# Patient Record
Sex: Female | Born: 1940 | Hispanic: Yes | Smoking: Never smoker
Health system: Southern US, Community
[De-identification: ages and names within clinical notes are randomized; demographics above are authoritative.]

## PROBLEM LIST (undated history)

## (undated) DIAGNOSIS — R001 Bradycardia, unspecified: Secondary | ICD-10-CM

## (undated) DIAGNOSIS — I272 Pulmonary hypertension, unspecified: Secondary | ICD-10-CM

## (undated) DIAGNOSIS — G8929 Other chronic pain: Secondary | ICD-10-CM

## (undated) DIAGNOSIS — G43909 Migraine, unspecified, not intractable, without status migrainosus: Secondary | ICD-10-CM

## (undated) DIAGNOSIS — Z8639 Personal history of other endocrine, nutritional and metabolic disease: Secondary | ICD-10-CM

## (undated) DIAGNOSIS — Z9989 Dependence on other enabling machines and devices: Secondary | ICD-10-CM

## (undated) DIAGNOSIS — Z950 Presence of cardiac pacemaker: Secondary | ICD-10-CM

## (undated) DIAGNOSIS — K219 Gastro-esophageal reflux disease without esophagitis: Secondary | ICD-10-CM

## (undated) DIAGNOSIS — G4733 Obstructive sleep apnea (adult) (pediatric): Secondary | ICD-10-CM

## (undated) DIAGNOSIS — I509 Heart failure, unspecified: Secondary | ICD-10-CM

## (undated) DIAGNOSIS — K5090 Crohn's disease, unspecified, without complications: Secondary | ICD-10-CM

## (undated) DIAGNOSIS — R011 Cardiac murmur, unspecified: Secondary | ICD-10-CM

## (undated) DIAGNOSIS — E7800 Pure hypercholesterolemia, unspecified: Secondary | ICD-10-CM

## (undated) DIAGNOSIS — M549 Dorsalgia, unspecified: Secondary | ICD-10-CM

## (undated) DIAGNOSIS — C4490 Unspecified malignant neoplasm of skin, unspecified: Secondary | ICD-10-CM

## (undated) DIAGNOSIS — Z9289 Personal history of other medical treatment: Secondary | ICD-10-CM

## (undated) DIAGNOSIS — I10 Essential (primary) hypertension: Secondary | ICD-10-CM

## (undated) HISTORY — PX: CARDIAC CATHETERIZATION: SHX172

## (undated) HISTORY — DX: Personal history of other endocrine, nutritional and metabolic disease: Z86.39

## (undated) HISTORY — DX: Personal history of other medical treatment: Z92.89

---

## 1970-07-20 HISTORY — PX: VAGINAL HYSTERECTOMY: SUR661

## 1980-07-20 HISTORY — PX: CATARACT EXTRACTION: SUR2

## 2001-07-20 DIAGNOSIS — C4490 Unspecified malignant neoplasm of skin, unspecified: Secondary | ICD-10-CM

## 2001-07-20 HISTORY — DX: Unspecified malignant neoplasm of skin, unspecified: C44.90

## 2001-07-20 HISTORY — PX: SKIN CANCER EXCISION: SHX779

## 2014-01-29 ENCOUNTER — Emergency Department (HOSPITAL_COMMUNITY): Payer: Medicaid Other

## 2014-01-29 ENCOUNTER — Inpatient Hospital Stay (HOSPITAL_COMMUNITY)
Admission: EM | Admit: 2014-01-29 | Discharge: 2014-02-01 | DRG: 918 | Disposition: A | Discharge status: Home / Self Care | Payer: Medicaid Other | Attending: Internal Medicine | Admitting: Internal Medicine

## 2014-01-29 ENCOUNTER — Encounter (HOSPITAL_COMMUNITY): Payer: Self-pay | Admitting: Emergency Medicine

## 2014-01-29 DIAGNOSIS — E875 Hyperkalemia: Secondary | ICD-10-CM | POA: Diagnosis present

## 2014-01-29 DIAGNOSIS — E1159 Type 2 diabetes mellitus with other circulatory complications: Secondary | ICD-10-CM | POA: Diagnosis present

## 2014-01-29 DIAGNOSIS — K5090 Crohn's disease, unspecified, without complications: Secondary | ICD-10-CM | POA: Diagnosis present

## 2014-01-29 DIAGNOSIS — T46901A Poisoning by unspecified agents primarily affecting the cardiovascular system, accidental (unintentional), initial encounter: Secondary | ICD-10-CM | POA: Diagnosis present

## 2014-01-29 DIAGNOSIS — R001 Bradycardia, unspecified: Secondary | ICD-10-CM | POA: Diagnosis present

## 2014-01-29 DIAGNOSIS — I5030 Unspecified diastolic (congestive) heart failure: Secondary | ICD-10-CM | POA: Diagnosis present

## 2014-01-29 DIAGNOSIS — I5032 Chronic diastolic (congestive) heart failure: Secondary | ICD-10-CM | POA: Diagnosis present

## 2014-01-29 DIAGNOSIS — T460X4S Poisoning by cardiac-stimulant glycosides and drugs of similar action, undetermined, sequela: Secondary | ICD-10-CM

## 2014-01-29 DIAGNOSIS — I272 Pulmonary hypertension, unspecified: Secondary | ICD-10-CM | POA: Diagnosis present

## 2014-01-29 DIAGNOSIS — I498 Other specified cardiac arrhythmias: Secondary | ICD-10-CM | POA: Diagnosis present

## 2014-01-29 DIAGNOSIS — E119 Type 2 diabetes mellitus without complications: Secondary | ICD-10-CM

## 2014-01-29 DIAGNOSIS — I2789 Other specified pulmonary heart diseases: Secondary | ICD-10-CM | POA: Diagnosis present

## 2014-01-29 DIAGNOSIS — I5031 Acute diastolic (congestive) heart failure: Secondary | ICD-10-CM

## 2014-01-29 DIAGNOSIS — Z79899 Other long term (current) drug therapy: Secondary | ICD-10-CM | POA: Diagnosis not present

## 2014-01-29 DIAGNOSIS — T460X1A Poisoning by cardiac-stimulant glycosides and drugs of similar action, accidental (unintentional), initial encounter: Secondary | ICD-10-CM

## 2014-01-29 DIAGNOSIS — I10 Essential (primary) hypertension: Secondary | ICD-10-CM

## 2014-01-29 DIAGNOSIS — E1122 Type 2 diabetes mellitus with diabetic chronic kidney disease: Secondary | ICD-10-CM

## 2014-01-29 DIAGNOSIS — G4733 Obstructive sleep apnea (adult) (pediatric): Secondary | ICD-10-CM | POA: Diagnosis present

## 2014-01-29 DIAGNOSIS — T460X1S Poisoning by cardiac-stimulant glycosides and drugs of similar action, accidental (unintentional), sequela: Secondary | ICD-10-CM

## 2014-01-29 DIAGNOSIS — I359 Nonrheumatic aortic valve disorder, unspecified: Secondary | ICD-10-CM | POA: Diagnosis present

## 2014-01-29 DIAGNOSIS — I509 Heart failure, unspecified: Secondary | ICD-10-CM | POA: Diagnosis present

## 2014-01-29 DIAGNOSIS — I2510 Atherosclerotic heart disease of native coronary artery without angina pectoris: Secondary | ICD-10-CM | POA: Diagnosis present

## 2014-01-29 DIAGNOSIS — N179 Acute kidney failure, unspecified: Secondary | ICD-10-CM | POA: Diagnosis present

## 2014-01-29 DIAGNOSIS — I152 Hypertension secondary to endocrine disorders: Secondary | ICD-10-CM | POA: Diagnosis present

## 2014-01-29 DIAGNOSIS — Z9989 Dependence on other enabling machines and devices: Secondary | ICD-10-CM

## 2014-01-29 HISTORY — DX: Cardiac murmur, unspecified: R01.1

## 2014-01-29 HISTORY — DX: Pulmonary hypertension, unspecified: I27.20

## 2014-01-29 HISTORY — DX: Crohn's disease, unspecified, without complications: K50.90

## 2014-01-29 HISTORY — DX: Dependence on other enabling machines and devices: Z99.89

## 2014-01-29 HISTORY — DX: Heart failure, unspecified: I50.9

## 2014-01-29 HISTORY — DX: Obstructive sleep apnea (adult) (pediatric): G47.33

## 2014-01-29 HISTORY — DX: Essential (primary) hypertension: I10

## 2014-01-29 HISTORY — DX: Bradycardia, unspecified: R00.1

## 2014-01-29 LAB — CBC
HCT: 45 % (ref 36.0–46.0)
Hemoglobin: 14.6 g/dL (ref 12.0–15.0)
MCH: 29.7 pg (ref 26.0–34.0)
MCHC: 32.4 g/dL (ref 30.0–36.0)
MCV: 91.6 fL (ref 78.0–100.0)
Platelets: 262 10*3/uL (ref 150–400)
RBC: 4.91 MIL/uL (ref 3.87–5.11)
RDW: 14.6 % (ref 11.5–15.5)
WBC: 10 10*3/uL (ref 4.0–10.5)

## 2014-01-29 LAB — COMPREHENSIVE METABOLIC PANEL
ALT: 60 U/L — ABNORMAL HIGH (ref 0–35)
AST: 31 U/L (ref 0–37)
Albumin: 4.2 g/dL (ref 3.5–5.2)
Alkaline Phosphatase: 167 U/L — ABNORMAL HIGH (ref 39–117)
Anion gap: 16 — ABNORMAL HIGH (ref 5–15)
BUN: 31 mg/dL — ABNORMAL HIGH (ref 6–23)
CO2: 22 mEq/L (ref 19–32)
Calcium: 9.9 mg/dL (ref 8.4–10.5)
Chloride: 102 mEq/L (ref 96–112)
Creatinine, Ser: 1.79 mg/dL — ABNORMAL HIGH (ref 0.50–1.10)
GFR calc Af Amer: 31 mL/min — ABNORMAL LOW (ref >90)
GFR calc non Af Amer: 27 mL/min — ABNORMAL LOW (ref >90)
Glucose, Bld: 127 mg/dL — ABNORMAL HIGH (ref 70–99)
Potassium: 5.4 mEq/L — ABNORMAL HIGH (ref 3.7–5.3)
Sodium: 140 mEq/L (ref 137–147)
Total Bilirubin: 0.4 mg/dL (ref 0.3–1.2)
Total Protein: 8.8 g/dL — ABNORMAL HIGH (ref 6.0–8.3)

## 2014-01-29 LAB — URINALYSIS, ROUTINE W REFLEX MICROSCOPIC
Bilirubin Urine: NEGATIVE
Glucose, UA: NEGATIVE mg/dL
Hgb urine dipstick: NEGATIVE
Ketones, ur: NEGATIVE mg/dL
Leukocytes, UA: NEGATIVE
Nitrite: NEGATIVE
Protein, ur: NEGATIVE mg/dL
Specific Gravity, Urine: 1.008 (ref 1.005–1.030)
Urobilinogen, UA: 1 mg/dL (ref 0.0–1.0)
pH: 6 (ref 5.0–8.0)

## 2014-01-29 LAB — DIGOXIN LEVEL: Digoxin Level: 1.9 ng/mL (ref 0.8–2.0)

## 2014-01-29 LAB — PRO B NATRIURETIC PEPTIDE: Pro B Natriuretic peptide (BNP): 1482 pg/mL — ABNORMAL HIGH (ref 0–125)

## 2014-01-29 LAB — I-STAT TROPONIN, ED: Troponin i, poc: 0.02 ng/mL (ref 0.00–0.08)

## 2014-01-29 MED ORDER — ASPIRIN 81 MG PO CHEW
324.0000 mg | CHEWABLE_TABLET | Freq: Once | ORAL | Status: AC
  Administered 2014-01-29: 324 mg via ORAL
  Filled 2014-01-29: qty 4

## 2014-01-29 MED ORDER — FUROSEMIDE 20 MG PO TABS: 20.0000 mg | ORAL_TABLET | Freq: Every day | ORAL | Status: DC

## 2014-01-29 MED ORDER — SODIUM CHLORIDE 0.9 % IV SOLN: 1000.0000 mL | INTRAVENOUS | Status: DC

## 2014-01-29 MED ORDER — SODIUM CHLORIDE 0.9 % IV SOLN
1000.0000 mL | Freq: Once | INTRAVENOUS | Status: AC
  Administered 2014-01-29: 1000 mL via INTRAVENOUS

## 2014-01-29 NOTE — ED Notes (Signed)
CBG 124 

## 2014-01-29 NOTE — H&P (Signed)
PCP:  none   Chief Complaint:  Low heart rate  HPI: Heather [name] is a 73 y.o. female   has a past medical history of CHF (congestive heart failure); Hypertension; Diabetes mellitus without complication; Murmur, cardiac; Pulmonary hypertension; OSA on CPAP; and Crohn's disease.   Presented with  Patient has recently moved from [place]. She has been hospitalized there and was diagnosed with diastolic CHF and pulmonary HTN.  She was feeling tired for the past few weeks. Family checked her vitals and found she has a pulse of 38. They brought her to ER. She was found to have digoxin level of 1.9 patient have been taking digoxin from [place] in drop form and been taking a bit extra. She supposed to take 7 drops but takes 10-12 on occasion not sure why. She uses CPAP at home. She has hx of DM. No known hx of kidney disease.    Hospitalist was called for admission for  bradycardia  Review of Systems:    Pertinent positives include:   Constitutional:  No weight loss, night sweats, Fevers, chills, fatigue, weight loss  HEENT:  No headaches, Difficulty swallowing,Tooth/dental problems,Sore throat,  No sneezing, itching, ear ache, nasal congestion, post nasal drip,  Cardio-vascular:  No chest pain, Orthopnea, PND, anasarca, dizziness, palpitations.no Bilateral lower extremity swelling  GI:  No heartburn, indigestion, abdominal pain, nausea, vomiting, diarrhea, change in bowel habits, loss of appetite, melena, blood in stool, hematemesis Resp:  no shortness of breath at rest. No dyspnea on exertion, No excess mucus, no productive cough, No non-productive cough, No coughing up of blood.No change in color of mucus.No wheezing. Skin:  no rash or lesions. No jaundice GU:  no dysuria, change in color of urine, no urgency or frequency. No straining to urinate.  No flank pain.  Musculoskeletal:  No joint pain or no joint swelling. No decreased range of motion. No back pain.  Psych:  No  change in mood or affect. No depression or anxiety. No memory loss.  Neuro: no localizing neurological complaints, no tingling, no weakness, no double vision, no gait abnormality, no slurred speech, no confusion  Otherwise ROS are negative except for above, 10 systems were reviewed  Past Medical History: Past Medical History  Diagnosis Date  . CHF (congestive heart failure)   . Hypertension   . Diabetes mellitus without complication   . Murmur, cardiac   . Pulmonary hypertension   . OSA on CPAP   . Crohn's disease    History reviewed. No pertinent past surgical history.   Medications: Prior to Admission medications   Medication Sig Start Date End Date Taking? Authorizing Provider  carvedilol (COREG) 6.25 MG tablet Take 6.25 mg by mouth 2 (two) times daily with a meal.   Yes Historical Provider, MD  DIGOXIN PO Take 7 drops by mouth every morning. Bottle says Gotas  0.65m/ml   In a 139mbottle   Yes Historical Provider, MD  furosemide (LASIX) 40 MG tablet Take 40 mg by mouth daily.   Yes Historical Provider, MD  losartan (COZAAR) 50 MG tablet Take 50 mg by mouth daily.   Yes Historical Provider, MD  metFORMIN (GLUCOPHAGE) 850 MG tablet Take 850 mg by mouth daily.   Yes Historical Provider, MD  NIFEdipine (PROCARDIA-XL/ADALAT CC) 30 MG 24 hr tablet Take 30 mg by mouth daily.   Yes Historical Provider, MD  omeprazole (PRILOSEC) 20 MG capsule Take 20 mg by mouth daily.   Yes Historical Provider, MD  spironolactone (ALDACTONE)  25 MG tablet Take 25 mg by mouth daily.   Yes Historical Provider, MD    Allergies:  No Known Allergies  Social History:  Ambulatory  independently   Lives at home   With family     reports that she has never smoked. She does not have any smokeless tobacco history on file. She reports that she does not drink alcohol or use illicit drugs.    Family History: family history is not on file.    Physical Exam: Patient Vitals for the past 24 hrs:  BP Temp  Temp src Pulse Resp SpO2  01/29/14 2315 146/122 mmHg - - 48 23 96 %  01/29/14 2300 150/74 mmHg - - 37 26 92 %  01/29/14 2245 152/54 mmHg - - 37 20 98 %  01/29/14 2230 158/48 mmHg - - 37 18 98 %  01/29/14 2215 163/51 mmHg - - 38 14 98 %  01/29/14 2200 149/44 mmHg - - 26 14 98 %  01/29/14 2130 158/56 mmHg - - 36 18 97 %  01/29/14 2128 - - - 38 12 98 %  01/29/14 2120 - - - 37 18 98 %  01/29/14 2115 163/52 mmHg - - 38 16 98 %  01/29/14 2112 - - - 40 18 98 %  01/29/14 2104 - - - 39 22 98 %  01/29/14 2100 152/60 mmHg - - 40 20 96 %  01/29/14 2056 - - - 41 17 97 %  01/29/14 2054 - - - 45 21 96 %  01/29/14 2048 - - - 41 17 98 %  01/29/14 2045 167/54 mmHg - - 36 14 97 %  01/29/14 2042 - - - 41 18 98 %  01/29/14 2036 - - - - - 99 %  01/29/14 2036 - - - 40 14 99 %  01/29/14 2030 166/55 mmHg - - 40 16 97 %  01/29/14 2016 177/51 mmHg 98.2 F (36.8 C) Oral 39 16 -    1. General:  in No Acute distress 2. Psychological: Alert and Oriented 3. Head/ENT:     Dry Mucous Membranes                          Head Non traumatic, neck supple                          Normal  Dentition 4. SKIN:   decreased Skin turgor,  Skin clean Dry and intact no rash 5. Heart: Regular rate and rhythm with systolic Murmur, Rub or gallop 6. Lungs: Clear to auscultation bilaterally, no wheezes or crackles   7. Abdomen: Soft, non-tender, Non distended 8. Lower extremities: no clubbing, cyanosis, or edema 9. Neurologically Grossly intact, moving all 4 extremities equally 10. MSK: Normal range of motion  body mass index is unknown because there is no height or weight on file.   Labs on Admission:   Recent Labs  01/29/14 2030  NA 140  K 5.4*  CL 102  CO2 22  GLUCOSE 127*  BUN 31*  CREATININE 1.79*  CALCIUM 9.9    Recent Labs  01/29/14 2030  AST 31  ALT 60*  ALKPHOS 167*  BILITOT 0.4  PROT 8.8*  ALBUMIN 4.2   No results found for this basename: LIPASE, AMYLASE,  in the last 72 hours  Recent Labs   01/29/14 2030  WBC 10.0  HGB 14.6  HCT 45.0  MCV 91.6  PLT 262   No results found for this basename: CKTOTAL, CKMB, CKMBINDEX, TROPONINI,  in the last 72 hours No results found for this basename: TSH, T4TOTAL, FREET3, T3FREE, THYROIDAB,  in the last 72 hours No results found for this basename: VITAMINB12, FOLATE, FERRITIN, TIBC, IRON, RETICCTPCT,  in the last 72 hours No results found for this basename: HGBA1C    CrCl is unknown because there is no height on file for the current visit. ABG No results found for this basename: phart, pco2, po2, hco3, tco2, acidbasedef, o2sat     No results found for this basename: DDIMER     Other results:  I have pearsonaly reviewed this: ECG REPORT  Rate:43  Rhythm: junctional bradycadia ST&T Change: no ischemia. consistent with dig toxicity   UA no UTI  BNP (last 3 results)  Recent Labs  01/29/14 2030  PROBNP 1482.0*    There were no vitals filed for this visit.   Cultures: No results found for this basename: sdes, specrequest, cult, reptstatus    Radiological Exams on Admission: Dg Chest Portable 1 View  01/29/2014   CLINICAL DATA:  Bradycardia. Chest pain. Hypertension. Heart failure.  EXAM: PORTABLE CHEST - 1 VIEW  COMPARISON:  None.  FINDINGS: Mildly enlarged cardiopericardial silhouette. Atherosclerotic aortic arch.  No overt edema. Slight pleural thickening at the right lung apex, likely benign/incidental.  Degenerative AC joint arthropathy bilaterally.  IMPRESSION: 1. Mildly enlarged cardiopericardial silhouette, without edema. 2. Atherosclerotic aortic arch.   Electronically Signed   By: Sherryl Barters M.D.   On: 01/29/2014 21:35    Chart has been reviewed  Assessment/Plan 73 yo F with unintentional dig toxicity  Present on Admission:  . Bradycardia - due to digoxin toxicity will hold dig, carvedilol, monitor on Tele, cardiology has consulted . DM (diabetes mellitus) -SSI hold metformin . OSA on CPAP - write for  CPAP . Pulmonary hypertension - order echo . CAD (coronary artery disease) - cycle CE, monitor on tele . Hypertension - hold carvedilol and losartan . Diastolic CHF - echo in AM . Hyperkalemia - treat with kayexalate and repeat check Mg   Prophylaxis:   Lovenox, Protonix  CODE STATUS: limitted code  DNI shock ok  Other plan as per orders.  I have spent a total of 55 min on this admission  Heather [name] 01/29/2014, 11:24 PM  [facility]  Pager [phone]   If 7AM-7PM, please contact the day team taking care of the patient  [email or web address]  Password [email or web address]

## 2014-01-29 NOTE — ED Notes (Signed)
Pt reports intermittent central "stabbing" chest pain that radiates to back and bilateral shoulders, rates now at 7/10. Reports SOB, dizziness, lightheadedness and nausea. Pt noted to have HR 40, unsure if hx of bradycardia. Pt AO x4.

## 2014-01-29 NOTE — Consult Note (Addendum)
CARDIOLOGY CONSULT NOTE   Patient ID: Heather [name] MRN: [id number], DOB/AGE: [birth year]   Admit date: 01/29/2014 Date of Consult: 01/29/2014   Primary Physician: No primary provider on file. Primary Cardiologist: None  Pt. Profile  73F from [place] with CHF, HTN, DM, pHTN, Crohns disease, OSA on CPAP who presents with progressive constitutional symptoms, abdominal discomfort, and bradycardia.   Problem List  Past Medical History  Diagnosis Date  . CHF (congestive heart failure)   . Hypertension   . Diabetes mellitus without complication   . Murmur, cardiac   . Pulmonary hypertension   . OSA on CPAP   . Crohn's disease     History reviewed. No pertinent past surgical history.   Allergies  No Known Allergies  HPI   73F from [place] with CHF, HTN, DM, pHTN, Crohns disease, OSA on CPAP who presents with progressive constitutional symptoms, abdominal discomfort, and bradycardia.   [name] relocated to the [place] from October 2014 after she obtained legal status. Prior to this she had obtained all of her care in [place] and she has not had any care in the [place].   The patient has a reported history of pulmonary hypertension (apparently diagnosed via cardiac cath x 2), congestive heart failure, DM, HTN, and a "hole in the heart." She has no history of CAD. She was hospitalized in [place] shortly before she immigrated to the [place]. She was "very sick" although the family is not sure with what exactly. Her heart was apparently doing something abnormal and she was prescribed a digoxin elixir. They denied ever hearing the term atrial fibrillation.   Since then, Heather [name] has done poorly. She has unintentionally lost 20-30lbs due to poor appetite. She has felt progressively fatigued and this is worse over the past month. She has two months of dyspnea without PND, orthopnea, or much leg swelling, although she says she will get a little if she missed a dose of her lasix.   Today Ms.  [name] had c/o some CP and the patients daughter called her daughter (a Therapist, sports in [place]). Patients daughter was instructed to check a pulse which was apparently 16 and so they came to the ER.   In the ER, she was noted to be in a junctional bradycardia with diffuse scooping of the ST segments. Labs notable for  Dig 1.9, K 5.4, Cr 1.79, BNP 1482. CXR w/o pulmonary edema. Cardiology was consulted.   On my interview, the patient reported that she has been taking "10 drops" of the 0.83m/ml digoxin elixir per day and has done so without changing her dose. She denies the possibility of having mixed up her meds although the daughter believes it is a possibility. She is also on coreg but no other nodal agents. The family is unaware of a history of CKD.   Inpatient Medications     Family History No family history on file.   Social History History   Social History  . Marital Status: Single    Spouse Name: N/A    Number of Children: N/A  . Years of Education: N/A   Occupational History  . Not on file.   Social History Main Topics  . Smoking status: Never Smoker   . Smokeless tobacco: Not on file  . Alcohol Use: No  . Drug Use: No  . Sexual Activity: Not on file   Other Topics Concern  . Not on file   Social History Narrative  . No narrative on file  Review of Systems  General:  No chills, fever. + diaphoresis yesterday. 20-30# weight loss since October 2014  Cardiovascular:  + chest pain, + dyspnea on exertion, - edema, - orthopnea, + occasional palpitations, - paroxysmal nocturnal dyspnea. Dermatological: No rash, lesions/masses Respiratory: No cough, + dyspnea Urologic: No hematuria, dysuria. Notes some lower abd pain with urination but not burning. Abdominal:   No nausea, vomiting, diarrhea, bright red blood per rectum, melena, or hematemesis Neurologic:  No visual changes, wkns, changes in mental status. All other systems reviewed and are otherwise negative except as noted  above.  Physical Exam  Blood pressure 149/44, pulse 26, temperature 98.2 F (36.8 C), temperature source Oral, resp. rate 14, SpO2 98.00%.  General: Pleasant, NAD. Lying flat comfortably.  Psych: Normal affect. Neuro: Alert and oriented X 3. Moves all extremities spontaneously. HEENT: Normal  Neck: Supple without bruits or JVD. Lungs:  Resp regular and unlabored, CTA. Heart: bradycardic, regular, no s3, s4. 3/6 holosystolic murmur across the precordium.  Abdomen: Soft, obese, BS + x 4. Epigastric tenderness.  Extremities: No clubbing, cyanosis or edema. DP/PT/Radials 2+ and equal bilaterally.  Labs  No results found for this basename: CKTOTAL, CKMB, TROPONINI,  in the last 72 hours Lab Results  Component Value Date   WBC 10.0 01/29/2014   HGB 14.6 01/29/2014   HCT 45.0 01/29/2014   MCV 91.6 01/29/2014   PLT 262 01/29/2014    Recent Labs Lab 01/29/14 2030  NA 140  K 5.4*  CL 102  CO2 22  BUN 31*  CREATININE 1.79*  CALCIUM 9.9  PROT 8.8*  BILITOT 0.4  ALKPHOS 167*  ALT 60*  AST 31  GLUCOSE 127*   No results found for this basename: CHOL, HDL, LDLCALC, TRIG   No results found for this basename: DDIMER    Radiology/Studies  Dg Chest Portable 1 View  01/29/2014   CLINICAL DATA:  Bradycardia. Chest pain. Hypertension. Heart failure.  EXAM: PORTABLE CHEST - 1 VIEW  COMPARISON:  None.  FINDINGS: Mildly enlarged cardiopericardial silhouette. Atherosclerotic aortic arch.  No overt edema. Slight pleural thickening at the right lung apex, likely benign/incidental.  Degenerative AC joint arthropathy bilaterally.  IMPRESSION: 1. Mildly enlarged cardiopericardial silhouette, without edema. 2. Atherosclerotic aortic arch.   Electronically Signed   By: Sherryl Barters M.D.   On: 01/29/2014 21:35    ECG Junctional bradycardia with diffuse scooping of ST segments  Telemetry Mainly sinus bradycardia in 40s, rare junctional rhythm  ASSESSMENT AND PLAN  73F from [place] with CHF,  HTN, DM, pHTN, Crohns disease, OSA on CPAP who presents with progressive constitutional symptoms, abdominal discomfort, and bradycardia.    Clinical syndrome is consistent with digoxin toxicity. Unclear if Cr is elevated compared to baseline, contributing to dig accumulation.  The daughter estimates that the patient has about 1/4 tsp of the 0.71m/ml concentration digoxin elixir per day. If we assume that each tsp is about 5cc, then she is getting well over 0.62mof digoxin per day. Based on that, the patient may have actually been dig toxic (at least to some degree) for months. It is possible that a fair amount the the constitutional symptoms and weight loss are related to dig toxicity if the dose estimate and duration are correct. On exam she is dry. She has borderline hyperkalemia.    1. Hold: dig, coreg, losartan 2. PRN hydralazine for HTN 3. Would gently hydrate with 1L NS overnight.  3. Obtain a TTE - her structure and function are  not known and she plans on establishing care here in [place].   We will follow.    Tobi Bastos, MD 01/29/2014, 10:27 PM

## 2014-01-29 NOTE — ED Provider Notes (Signed)
CSN: [id number]     Arrival date & time 01/29/14  2006 History   First MD Initiated Contact with Patient 01/29/14 2019     Chief Complaint  Patient presents with  . Bradycardia  . Chest Pain     (Consider location/radiation/quality/duration/timing/severity/associated sxs/prior Treatment) HPI 73 year old Hispanic female with a history of CHF hypertension, diabetes, pulmonary hypertension who presents today with chest pain and concerns for bradycardia. The history is provided by the patient and by the daughter the bedside. Patient has been having intermittent chest pains that radiate to the back into the bilateral shoulders. This has been an ongoing problem for proximally one month. Patient has had associated mild shortness of breath, dizziness, lightheadedness, nausea. Again, these symptoms have been ongoing for a long time. The daughter was concerned and consulted with her family member who is a nurse who recommended that she take the patient's pulse. The pulse was checked and was low and decided to come to the hospital tonight.  Past Medical History  Diagnosis Date  . CHF (congestive heart failure)   . Hypertension   . Diabetes mellitus without complication   . Murmur, cardiac   . Pulmonary hypertension   . OSA on CPAP   . Crohn's disease    History reviewed. No pertinent past surgical history. Family History  Problem Relation Age of Onset  . Adopted: Yes   History  Substance Use Topics  . Smoking status: Never Smoker   . Smokeless tobacco: Not on file  . Alcohol Use: No   OB History   Grav Para Term Preterm Abortions TAB SAB Ect Mult Living                 Review of Systems  Constitutional: Negative for fever and chills.  HENT: Negative for sore throat.   Eyes: Negative for pain.  Respiratory: Positive for shortness of breath. Negative for cough.   Cardiovascular: Positive for chest pain.  Gastrointestinal: Positive for nausea. Negative for vomiting, abdominal pain and  diarrhea.  Genitourinary: Negative for dysuria.  Musculoskeletal: Negative for back pain.  Skin: Negative for rash.  Neurological: Positive for light-headedness. Negative for numbness and headaches.      Allergies  Review of patient's allergies indicates no known allergies.  Home Medications   Prior to Admission medications   Medication Sig Start Date End Date Taking? Authorizing Provider  carvedilol (COREG) 6.25 MG tablet Take 6.25 mg by mouth 2 (two) times daily with a meal.   Yes Historical Provider, MD  DIGOXIN PO Take 7 drops by mouth every morning. Bottle says Gotas  0.36m/ml   In a 138mbottle   Yes Historical Provider, MD  furosemide (LASIX) 40 MG tablet Take 40 mg by mouth daily.   Yes Historical Provider, MD  losartan (COZAAR) 50 MG tablet Take 50 mg by mouth daily.   Yes Historical Provider, MD  metFORMIN (GLUCOPHAGE) 850 MG tablet Take 850 mg by mouth daily.   Yes Historical Provider, MD  NIFEdipine (PROCARDIA-XL/ADALAT CC) 30 MG 24 hr tablet Take 30 mg by mouth daily.   Yes Historical Provider, MD  omeprazole (PRILOSEC) 20 MG capsule Take 20 mg by mouth daily.   Yes Historical Provider, MD  spironolactone (ALDACTONE) 25 MG tablet Take 25 mg by mouth daily.   Yes Historical Provider, MD   BP 156/52  Pulse 42  Temp(Src) 98.2 F (36.8 C) (Oral)  Resp 21  SpO2 95% Physical Exam  Constitutional: She is oriented to person, place, and  time. She appears well-developed and well-nourished. No distress.  HENT:  Head: Normocephalic and atraumatic.  Eyes: Pupils are equal, round, and reactive to light. Right eye exhibits no discharge. Left eye exhibits no discharge.  Neck: Normal range of motion.  Cardiovascular: Regular rhythm and normal heart sounds.  Bradycardia present.   Pulmonary/Chest: Effort normal and breath sounds normal.  Abdominal: Soft. She exhibits no distension. There is no tenderness.  Musculoskeletal: Normal range of motion.  Neurological: She is alert and  oriented to person, place, and time.  Skin: Skin is warm. She is not diaphoretic.    ED Course  Procedures (including critical care time) Labs Review Labs Reviewed  COMPREHENSIVE METABOLIC PANEL - Abnormal; Notable for the following:    Potassium 5.4 (*)    Glucose, Bld 127 (*)    BUN 31 (*)    Creatinine, Ser 1.79 (*)    Total Protein 8.8 (*)    ALT 60 (*)    Alkaline Phosphatase 167 (*)    GFR calc non Af Amer 27 (*)    GFR calc Af Amer 31 (*)    Anion gap 16 (*)    All other components within normal limits  PRO B NATRIURETIC PEPTIDE - Abnormal; Notable for the following:    Pro B Natriuretic peptide (BNP) 1482.0 (*)    All other components within normal limits  CBC  DIGOXIN LEVEL  URINALYSIS, ROUTINE W REFLEX MICROSCOPIC  TSH  I-STAT TROPOININ, ED    Imaging Review Dg Chest Portable 1 View  01/29/2014   CLINICAL DATA:  Bradycardia. Chest pain. Hypertension. Heart failure.  EXAM: PORTABLE CHEST - 1 VIEW  COMPARISON:  None.  FINDINGS: Mildly enlarged cardiopericardial silhouette. Atherosclerotic aortic arch.  No overt edema. Slight pleural thickening at the right lung apex, likely benign/incidental.  Degenerative AC joint arthropathy bilaterally.  IMPRESSION: 1. Mildly enlarged cardiopericardial silhouette, without edema. 2. Atherosclerotic aortic arch.   Electronically Signed   By: Sherryl Barters M.D.   On: 01/29/2014 21:35     EKG Interpretation   Date/Time:  Monday January 29 2014 20:10:43 EDT Ventricular Rate:  43 PR Interval:    QRS Duration: 94 QT Interval:  428 QTC Calculation: 361 R Axis:   79 Text Interpretation:  Junctional bradycardia Marked ST abnormality,  possible inferior subendocardial injury Abnormal ECG Consistent with  digoxin toxicity Confirmed by Reather Converse  MD, JOSHUA ([id number]) on 01/29/2014  11:41:16 PM      MDM   Final diagnoses:  Bradycardia   73 year old female with a history as listed above presents with chest pain and bradycardia with  associated shortness of breath, dizziness, lightheadedness, nausea.  Upon arrival the patient is hemodynamically stable. Heart rate is in the low 40s, but the patient has a stable blood pressure. She is not tachypneic. She appears in no acute distress at this time. Patient has bradycardia but this is not causing significant enough symptoms to necessitates pacing nor atropine and this time. Patient is on multiple medications that was lower heart rate, including a beta blocker, calcium channel blocker, and digoxin.  Given the patient's medications, there is concern for digoxin toxicity, concerns for beta blocker overdose, concern for calcium channel blocker overdose. Will obtain basic labs including a digoxin level.  Digoxin level I.9 as above, which is high level of normal. Given EKG and this level, is likely this patient is having digoxin toxicity. Cardiology was consulted. Please see the consult note for their further evaluation and determination. Cardiology will follow  in recommends inpatient management. Hospitalist was consulted for management. Vision admitted to the floor to telemetry in stable condition. Patient seen and evaluated by myself and by the attending Dr. Kate Sable, MD 01/30/14 [phone]

## 2014-01-29 NOTE — ED Notes (Signed)
Cardiologist at the bedside assessing patient

## 2014-01-30 ENCOUNTER — Encounter (HOSPITAL_COMMUNITY): Payer: Self-pay | Admitting: General Practice

## 2014-01-30 ENCOUNTER — Inpatient Hospital Stay (HOSPITAL_COMMUNITY): Payer: Medicaid Other

## 2014-01-30 DIAGNOSIS — T460X1A Poisoning by cardiac-stimulant glycosides and drugs of similar action, accidental (unintentional), initial encounter: Principal | ICD-10-CM

## 2014-01-30 DIAGNOSIS — E119 Type 2 diabetes mellitus without complications: Secondary | ICD-10-CM

## 2014-01-30 DIAGNOSIS — E875 Hyperkalemia: Secondary | ICD-10-CM

## 2014-01-30 DIAGNOSIS — I10 Essential (primary) hypertension: Secondary | ICD-10-CM

## 2014-01-30 DIAGNOSIS — I498 Other specified cardiac arrhythmias: Secondary | ICD-10-CM

## 2014-01-30 DIAGNOSIS — T46901A Poisoning by unspecified agents primarily affecting the cardiovascular system, accidental (unintentional), initial encounter: Secondary | ICD-10-CM

## 2014-01-30 DIAGNOSIS — I359 Nonrheumatic aortic valve disorder, unspecified: Secondary | ICD-10-CM

## 2014-01-30 DIAGNOSIS — I2789 Other specified pulmonary heart diseases: Secondary | ICD-10-CM

## 2014-01-30 LAB — CBC
HCT: 44.5 % (ref 36.0–46.0)
Hemoglobin: 14.1 g/dL (ref 12.0–15.0)
MCH: 29 pg (ref 26.0–34.0)
MCHC: 31.7 g/dL (ref 30.0–36.0)
MCV: 91.4 fL (ref 78.0–100.0)
Platelets: 255 10*3/uL (ref 150–400)
RBC: 4.87 MIL/uL (ref 3.87–5.11)
RDW: 14.8 % (ref 11.5–15.5)
WBC: 12.1 10*3/uL — ABNORMAL HIGH (ref 4.0–10.5)

## 2014-01-30 LAB — COMPREHENSIVE METABOLIC PANEL
ALT: 53 U/L — ABNORMAL HIGH (ref 0–35)
AST: 32 U/L (ref 0–37)
Albumin: 4 g/dL (ref 3.5–5.2)
Alkaline Phosphatase: 156 U/L — ABNORMAL HIGH (ref 39–117)
Anion gap: 15 (ref 5–15)
BUN: 27 mg/dL — ABNORMAL HIGH (ref 6–23)
CO2: 23 mEq/L (ref 19–32)
Calcium: 9.9 mg/dL (ref 8.4–10.5)
Chloride: 107 mEq/L (ref 96–112)
Creatinine, Ser: 1.63 mg/dL — ABNORMAL HIGH (ref 0.50–1.10)
GFR calc Af Amer: 35 mL/min — ABNORMAL LOW (ref >90)
GFR calc non Af Amer: 30 mL/min — ABNORMAL LOW (ref >90)
Glucose, Bld: 139 mg/dL — ABNORMAL HIGH (ref 70–99)
Potassium: 4.6 mEq/L (ref 3.7–5.3)
Sodium: 145 mEq/L (ref 137–147)
Total Bilirubin: 0.6 mg/dL (ref 0.3–1.2)
Total Protein: 8.5 g/dL — ABNORMAL HIGH (ref 6.0–8.3)

## 2014-01-30 LAB — TROPONIN I
Troponin I: <0.3 ng/mL (ref <0.30)
Troponin I: <0.3 ng/mL (ref <0.30)
Troponin I: <0.3 ng/mL (ref <0.30)

## 2014-01-30 LAB — GLUCOSE, CAPILLARY
Glucose-Capillary: 124 mg/dL — ABNORMAL HIGH (ref 70–99)
Glucose-Capillary: 112 mg/dL — ABNORMAL HIGH (ref 70–99)
Glucose-Capillary: 125 mg/dL — ABNORMAL HIGH (ref 70–99)
Glucose-Capillary: 98 mg/dL (ref 70–99)
Glucose-Capillary: 127 mg/dL — ABNORMAL HIGH (ref 70–99)
Glucose-Capillary: 157 mg/dL — ABNORMAL HIGH (ref 70–99)

## 2014-01-30 LAB — DIGOXIN LEVEL: Digoxin Level: 1.6 ng/mL (ref 0.8–2.0)

## 2014-01-30 LAB — HEMOGLOBIN A1C
Hgb A1c MFr Bld: 6.4 % — ABNORMAL HIGH (ref <5.7)
Mean Plasma Glucose: 137 mg/dL — ABNORMAL HIGH (ref <117)

## 2014-01-30 LAB — PHOSPHORUS: Phosphorus: 3.8 mg/dL (ref 2.3–4.6)

## 2014-01-30 LAB — CREATININE, URINE, RANDOM: Creatinine, Urine: 32.85 mg/dL

## 2014-01-30 LAB — TSH: TSH: 3.53 u[IU]/mL (ref 0.350–4.500)

## 2014-01-30 LAB — SODIUM, URINE, RANDOM: Sodium, Ur: 143 mEq/L

## 2014-01-30 LAB — MAGNESIUM: Magnesium: 2.3 mg/dL (ref 1.5–2.5)

## 2014-01-30 MED ORDER — SODIUM CHLORIDE 0.9 % IV SOLN: 250.0000 mL | INTRAVENOUS | Status: DC | PRN

## 2014-01-30 MED ORDER — GLUCERNA SHAKE PO LIQD
237.0000 mL | ORAL | Status: DC
  Administered 2014-01-31 – 2014-02-01 (×2): 237 mL via ORAL

## 2014-01-30 MED ORDER — INSULIN ASPART 100 UNIT/ML ~~LOC~~ SOLN
0.0000 [IU] | Freq: Three times a day (TID) | SUBCUTANEOUS | Status: DC
  Administered 2014-01-30 (×2): 2 [IU] via SUBCUTANEOUS
  Administered 2014-01-31: 3 [IU] via SUBCUTANEOUS
  Administered 2014-01-31 – 2014-02-01 (×2): 2 [IU] via SUBCUTANEOUS

## 2014-01-30 MED ORDER — HYDROCODONE-ACETAMINOPHEN 5-325 MG PO TABS: 1.0000 | ORAL_TABLET | ORAL | Status: DC | PRN

## 2014-01-30 MED ORDER — HYDRALAZINE HCL 20 MG/ML IJ SOLN: 10.0000 mg | INTRAMUSCULAR | Status: DC | PRN

## 2014-01-30 MED ORDER — ASPIRIN EC 81 MG PO TBEC
81.0000 mg | DELAYED_RELEASE_TABLET | Freq: Every day | ORAL | Status: DC
  Administered 2014-01-30 – 2014-02-01 (×3): 81 mg via ORAL
  Filled 2014-01-30 (×3): qty 1

## 2014-01-30 MED ORDER — ONDANSETRON HCL 4 MG/2ML IJ SOLN: 4.0000 mg | Freq: Four times a day (QID) | INTRAMUSCULAR | Status: DC | PRN

## 2014-01-30 MED ORDER — ACETAMINOPHEN 650 MG RE SUPP: 650.0000 mg | Freq: Four times a day (QID) | RECTAL | Status: DC | PRN

## 2014-01-30 MED ORDER — ENOXAPARIN SODIUM 30 MG/0.3ML ~~LOC~~ SOLN
30.0000 mg | SUBCUTANEOUS | Status: DC
  Administered 2014-01-30 – 2014-02-01 (×3): 30 mg via SUBCUTANEOUS
  Filled 2014-01-30 (×3): qty 0.3

## 2014-01-30 MED ORDER — ACETAMINOPHEN 325 MG PO TABS
650.0000 mg | ORAL_TABLET | Freq: Four times a day (QID) | ORAL | Status: DC | PRN
  Administered 2014-01-31 (×2): 650 mg via ORAL
  Filled 2014-01-30 (×2): qty 2

## 2014-01-30 MED ORDER — SODIUM CHLORIDE 0.9 % IV SOLN
INTRAVENOUS | Status: DC
  Administered 2014-01-30 – 2014-01-31 (×3): via INTRAVENOUS

## 2014-01-30 MED ORDER — INSULIN ASPART 100 UNIT/ML ~~LOC~~ SOLN: 0.0000 [IU] | Freq: Every day | SUBCUTANEOUS | Status: DC

## 2014-01-30 MED ORDER — DOCUSATE SODIUM 100 MG PO CAPS
100.0000 mg | ORAL_CAPSULE | Freq: Two times a day (BID) | ORAL | Status: DC
  Administered 2014-01-30 – 2014-01-31 (×3): 100 mg via ORAL
  Filled 2014-01-30 (×6): qty 1

## 2014-01-30 MED ORDER — SODIUM CHLORIDE 0.9 % IJ SOLN
3.0000 mL | Freq: Two times a day (BID) | INTRAMUSCULAR | Status: DC
  Administered 2014-01-30: 3 mL via INTRAVENOUS

## 2014-01-30 MED ORDER — ADULT MULTIVITAMIN W/MINERALS CH
1.0000 | ORAL_TABLET | Freq: Every day | ORAL | Status: DC
  Administered 2014-01-30 – 2014-02-01 (×3): 1 via ORAL
  Filled 2014-01-30 (×3): qty 1

## 2014-01-30 MED ORDER — SODIUM POLYSTYRENE SULFONATE 15 GM/60ML PO SUSP
30.0000 g | Freq: Once | ORAL | Status: AC
  Administered 2014-01-30: 30 g via ORAL
  Filled 2014-01-30: qty 120

## 2014-01-30 MED ORDER — SODIUM CHLORIDE 0.9 % IJ SOLN: 3.0000 mL | Freq: Two times a day (BID) | INTRAMUSCULAR | Status: DC

## 2014-01-30 MED ORDER — PANTOPRAZOLE SODIUM 40 MG PO TBEC
40.0000 mg | DELAYED_RELEASE_TABLET | Freq: Every day | ORAL | Status: DC
  Administered 2014-01-30 – 2014-02-01 (×3): 40 mg via ORAL
  Filled 2014-01-30 (×3): qty 1

## 2014-01-30 MED ORDER — SODIUM CHLORIDE 0.9 % IJ SOLN: 3.0000 mL | INTRAMUSCULAR | Status: DC | PRN

## 2014-01-30 MED ORDER — ONDANSETRON HCL 4 MG PO TABS: 4.0000 mg | ORAL_TABLET | Freq: Four times a day (QID) | ORAL | Status: DC | PRN

## 2014-01-30 NOTE — Progress Notes (Signed)
The patient arrived to 3E24 from the ED at [place].  She is A&Ox4 and does not have any complaints of pain at this time.  Her HR is in the low to mid-40s on the monitor at this time.  She speaks very little [place].  Her two daughters are at the bedside.

## 2014-01-30 NOTE — Progress Notes (Signed)
INITIAL NUTRITION ASSESSMENT  DOCUMENTATION CODES Per approved criteria  -Obesity Unspecified   INTERVENTION: Provide Snacks BID in between meals Provide Glucerna Shake once daily Provide Multivitamin with minerals daily  NUTRITION DIAGNOSIS: Inadequate oral intake related to poor appetite as evidenced by 10% weight loss and meal completion <30%.   Goal: Pt to meet >/= 90% of their estimated nutrition needs   Monitor:  PO intake, weight trend, labs  Reason for Assessment: Malnutrition Screening Tool, score of 4  73 y.o. female  Admitting Dx: Bradycardia  ASSESSMENT: 73 y.o. female has a past medical history of CHF (congestive heart failure); Hypertension; Diabetes mellitus without complication; Murmur, cardiac; Pulmonary hypertension; OSA on CPAP; and Crohn's disease. Patient has recently moved from [place]. She has been hospitalized there and was diagnosed with diastolic CHF and pulmonary HTN. She was feeling tired for the past few weeks. Family checked her vitals and found she has a pulse of 38. They brought her to ER. She was found to have digoxin level of 1.9.  RD used telephonic interpreting service to communicate with patient in [place]. Pt reports that before she left [place] she was weighing 84 kg. She reports unintentional weight loss due to decreased appetite which started about one year ago after she started taking Metformin and another medication. She reports she has very little interest in eating and she has been feeling very very tired all the time. She eats a little soup, fish, and fruit; states she doesn't like rice or meat.  Based on patient's report she has lost 10% of her body weight in the past year; wt loss not significant for time frame. Per nursing notes pt consumed 25% of her breakfast this morning.  Labs reviewed. HgbA1C= 6.4%  Nutrition Focused Physical Exam:  Subcutaneous Fat:  Orbital Region: wnl Upper Arm Region: mild wasting Thoracic and Lumbar  Region: wnl  Muscle:  Temple Region: mild wasting Clavicle Bone Region: wnl Clavicle and Acromion Bone Region: wnl Scapular Bone Region: NA Dorsal Hand: wnl Patellar Region: wnl Anterior Thigh Region: wnl Posterior Calf Region: wnl  Edema: none noted    Height: Ht Readings from Last 1 Encounters:  01/30/14 5' 2"  (1.575 m)    Weight: Wt Readings from Last 1 Encounters:  01/30/14 167 lb 12.8 oz (76.114 kg)    Ideal Body Weight: 50 kg  % Ideal Body Weight: 152%  Wt Readings from Last 10 Encounters:  01/30/14 167 lb 12.8 oz (76.114 kg)    Usual Body Weight: 84 kg   % Usual Body Weight: 90%  BMI:  Body mass index is 30.68 kg/(m^2). (Obese)  Estimated Nutritional Needs: Kcal: 1600-1800 Protein: 70-80 grams Fluid: 1.6 L/day  Skin: intact  Diet Order: Carb Control  EDUCATION NEEDS: -No education needs identified at this time   Intake/Output Summary (Last 24 hours) at 01/30/14 1111 Last data filed at 01/30/14 0816  Gross per 24 hour  Intake    720 ml  Output    200 ml  Net    520 ml    Last BM: 7/6   Labs:   Recent Labs Lab 01/29/14 2030 01/30/14 0433  NA 140 145  K 5.4* 4.6  CL 102 107  CO2 22 23  BUN 31* 27*  CREATININE 1.79* 1.63*  CALCIUM 9.9 9.9  MG  --  2.3  PHOS  --  3.8  GLUCOSE 127* 139*    CBG (last 3)   Recent Labs  01/29/14 2024 01/30/14 0222 01/30/14 0554  GLUCAP 124* 112* 125*    Scheduled Meds: . aspirin EC  81 mg Oral Daily  . docusate sodium  100 mg Oral BID  . enoxaparin (LOVENOX) injection  30 mg Subcutaneous Q24H  . insulin aspart  0-15 Units Subcutaneous TID WC  . insulin aspart  0-5 Units Subcutaneous QHS  . pantoprazole  40 mg Oral Daily    Continuous Infusions: . sodium chloride 50 mL/hr at 01/30/14 0214    Past Medical History  Diagnosis Date  . CHF (congestive heart failure)   . Hypertension   . Diabetes mellitus without complication   . Murmur, cardiac   . Pulmonary hypertension   . OSA  on CPAP   . Crohn's disease     History reviewed. No pertinent past surgical history.  Pryor Ochoa RD, LDN Inpatient Clinical Dietitian Pager: [phone] After Hours Pager: [phone]

## 2014-01-30 NOTE — Progress Notes (Signed)
Echocardiogram 2D Echocardiogram has been performed.  Heather [name] 01/30/2014, 11:28 AM

## 2014-01-30 NOTE — Progress Notes (Signed)
Pt has home cpap with home mask. Water was placed in chamber. Wires were in good condition. Pt places self on and off. RT told pt to call if any questions or concerns.

## 2014-01-30 NOTE — Care Management Note (Signed)
    Page 1 of 1   01/30/2014     2:07:37 PM CARE MANAGEMENT NOTE 01/30/2014  Patient:  JAMILLIA, CLOSSON   Account Number:  [id number]  Date Initiated:  01/30/2014  Documentation initiated by:  Fuller Mandril  Subjective/Objective Assessment:   73 yo F with unintentional dig toxicity.// Home with adult children.     Action/Plan:   hold dig, carvedilol, monitor on Tele, cardiology has consulted.// Access for [facility] needs and medication assistance.   Anticipated DC Date:  02/01/2014   Anticipated DC Plan:  [place]  CM consult      Choice offered to / List presented to:             Status of service:  In process, will continue to follow Medicare Important Message given?   (If response is "NO", the following Medicare IM given date fields will be blank) Date Medicare IM given:   Medicare IM given by:   Date Additional Medicare IM given:   Additional Medicare IM given by:    Discharge Disposition:    Per UR Regulation:  Reviewed for med. necessity/level of care/duration of stay  If discussed at [place] of Stay Meetings, dates discussed:    Comments:

## 2014-01-30 NOTE — Progress Notes (Signed)
[facility] PROGRESS NOTE   Assessment/Plan: Bradycardia due to Digoxin toxicity: - Due to dig tox, weakness and EKG depression also compatible with dig toxicity. - Bradycardia Cont to improve. - Appreciated cardiology assistance on this case.  Hyperkalemia - Now resolved with hydration. - B-met in am.  DM (diabetes mellitus) - hold metformin with new AKI. - cont SSI.  AKI: - Probably due to pre-renal, ACE-I. - Hold diuretics.  OSA on CPAP - C-pap at night.  Hypertension/Diastolic CHF - BP is high. - hold ACE-I and diuretics monitor. - hydralazine PRN for SBP > 180.    Code Status: full Family Communication: family  Disposition Plan: inpatient   Consultants:  cardiology  Procedures:  none  Antibiotics:  none  HPI/Subjective: Feels nauseated.  Objective: Filed Vitals:   01/30/14 0045 01/30/14 0130 01/30/14 0613 01/30/14 0821  BP: 151/49 153/58 149/42 144/50  Pulse: 46 46 46 44  Temp:  98.6 F (37 C) 98.5 F (36.9 C) 98.6 F (37 C)  TempSrc:  Oral Oral Oral  Resp: 13 15 16 18   Height:  5' 2"  (1.575 m)    Weight:  76.114 kg (167 lb 12.8 oz)    SpO2: 96% 94% 98% 96%    Intake/Output Summary (Last 24 hours) at 01/30/14 0846 Last data filed at 01/30/14 0816  Gross per 24 hour  Intake    720 ml  Output    200 ml  Net    520 ml   Filed Weights   01/30/14 0130  Weight: 76.114 kg (167 lb 12.8 oz)    Exam:  General: Alert, awake, oriented x3, in no acute distress.  HEENT: No bruits, no goiter. -JVD Heart: Regular rate and rhythm. Lungs: Good air movement, clear Abdomen: Soft, nontender, nondistended, positive bowel sounds.  Neuro: Grossly intact, nonfocal.   Data Reviewed: Basic Metabolic Panel:  Recent Labs Lab 01/29/14 2030 01/30/14 0433  NA 140 145  K 5.4* 4.6  CL 102 107  CO2 22 23  GLUCOSE 127* 139*  BUN 31* 27*  CREATININE 1.79* 1.63*  CALCIUM 9.9 9.9  MG  --  2.3  PHOS  --  3.8   Liver Function  Tests:  Recent Labs Lab 01/29/14 2030 01/30/14 0433  AST 31 32  ALT 60* 53*  ALKPHOS 167* 156*  BILITOT 0.4 0.6  PROT 8.8* 8.5*  ALBUMIN 4.2 4.0   No results found for this basename: LIPASE, AMYLASE,  in the last 168 hours No results found for this basename: AMMONIA,  in the last 168 hours CBC:  Recent Labs Lab 01/29/14 2030 01/30/14 0433  WBC 10.0 12.1*  HGB 14.6 14.1  HCT 45.0 44.5  MCV 91.6 91.4  PLT 262 255   Cardiac Enzymes:  Recent Labs Lab 01/30/14 0433 01/30/14 0724  TROPONINI <0.30 <0.30   BNP (last 3 results)  Recent Labs  01/29/14 2030  PROBNP 1482.0*   CBG:  Recent Labs Lab 01/30/14 0222 01/30/14 0554  GLUCAP 112* 125*    No results found for this or any previous visit (from the past 240 hour(s)).   Studies: Dg Chest Portable 1 View  01/29/2014   CLINICAL DATA:  Bradycardia. Chest pain. Hypertension. Heart failure.  EXAM: PORTABLE CHEST - 1 VIEW  COMPARISON:  None.  FINDINGS: Mildly enlarged cardiopericardial silhouette. Atherosclerotic aortic arch.  No overt edema. Slight pleural thickening at the right lung apex, likely benign/incidental.  Degenerative AC joint arthropathy bilaterally.  IMPRESSION: 1. Mildly enlarged cardiopericardial silhouette, without  edema. 2. Atherosclerotic aortic arch.   Electronically Signed   By: Sherryl Barters M.D.   On: 01/29/2014 21:35    Scheduled Meds: . aspirin EC  81 mg Oral Daily  . docusate sodium  100 mg Oral BID  . enoxaparin (LOVENOX) injection  30 mg Subcutaneous Q24H  . [START ON 01/31/2014] furosemide  20 mg Oral Daily  . insulin aspart  0-15 Units Subcutaneous TID WC  . insulin aspart  0-5 Units Subcutaneous QHS  . pantoprazole  40 mg Oral Daily  . sodium chloride  3 mL Intravenous Q12H  . sodium chloride  3 mL Intravenous Q12H   Continuous Infusions: . sodium chloride 50 mL/hr at 01/30/14 0214     Charlynne Cousins  [facility] Pager [phone]. If 8PM-8AM, please contact  night-coverage at [email or web address], password [facility] 01/30/2014, 8:46 AM  LOS: 1 day   **Disclaimer: This note may have been dictated with voice recognition software. Similar sounding words can inadvertently be transcribed and this note may contain transcription errors which may not have been corrected upon publication of note.**

## 2014-01-30 NOTE — Progress Notes (Signed)
Patient Name: Heather [name] Date of Encounter: 01/30/2014     Active Problems:   Bradycardia   DM (diabetes mellitus)   OSA on CPAP   Pulmonary hypertension   CAD (coronary artery disease)   Hypertension   Diastolic CHF   Digoxin toxicity   Hyperkalemia    SUBJECTIVE  The patient feels better this morning.  Her digoxin has been stopped.  Telemetry shows marked sinus bradycardia at 40 per minute.  No chest pain or shortness of breath.  CURRENT MEDS . aspirin EC  81 mg Oral Daily  . docusate sodium  100 mg Oral BID  . enoxaparin (LOVENOX) injection  30 mg Subcutaneous Q24H  . [START ON 01/31/2014] furosemide  20 mg Oral Daily  . insulin aspart  0-15 Units Subcutaneous TID WC  . insulin aspart  0-5 Units Subcutaneous QHS  . pantoprazole  40 mg Oral Daily  . sodium chloride  3 mL Intravenous Q12H  . sodium chloride  3 mL Intravenous Q12H    OBJECTIVE  Filed Vitals:   01/30/14 0045 01/30/14 0130 01/30/14 0613 01/30/14 0821  BP: 151/49 153/58 149/42 144/50  Pulse: 46 46 46 44  Temp:  98.6 F (37 C) 98.5 F (36.9 C) 98.6 F (37 C)  TempSrc:  Oral Oral Oral  Resp: 13 15 16 18   Height:  5' 2"  (1.575 m)    Weight:  167 lb 12.8 oz (76.114 kg)    SpO2: 96% 94% 98% 96%    Intake/Output Summary (Last 24 hours) at 01/30/14 0843 Last data filed at 01/30/14 0816  Gross per 24 hour  Intake    720 ml  Output    200 ml  Net    520 ml   Filed Weights   01/30/14 0130  Weight: 167 lb 12.8 oz (76.114 kg)    PHYSICAL EXAM  General: Pleasant, NAD.  Does not speak [place] but family members are present. Neuro: Alert and oriented X 3. Moves all extremities spontaneously. Psych: Normal affect. HEENT:  Normal  Neck: Supple without bruits or JVD. Lungs:  Resp regular and unlabored, CTA. Heart: RRR no s3, s4, there is a grade 2/6 systolic ejection murmur at the aortic area. Abdomen: Soft, non-tender, non-distended, BS + x 4.  Extremities: No clubbing, cyanosis or edema.  DP/PT/Radials 2+ and equal bilaterally.  Accessory Clinical Findings  CBC  Recent Labs  01/29/14 2030 01/30/14 0433  WBC 10.0 12.1*  HGB 14.6 14.1  HCT 45.0 44.5  MCV 91.6 91.4  PLT 262 650   Basic Metabolic Panel  Recent Labs  01/29/14 2030 01/30/14 0433  NA 140 145  K 5.4* 4.6  CL 102 107  CO2 22 23  GLUCOSE 127* 139*  BUN 31* 27*  CREATININE 1.79* 1.63*  CALCIUM 9.9 9.9  MG  --  2.3  PHOS  --  3.8   Liver Function Tests  Recent Labs  01/29/14 2030 01/30/14 0433  AST 31 32  ALT 60* 53*  ALKPHOS 167* 156*  BILITOT 0.4 0.6  PROT 8.8* 8.5*  ALBUMIN 4.2 4.0   No results found for this basename: LIPASE, AMYLASE,  in the last 72 hours Cardiac Enzymes  Recent Labs  01/30/14 0433 01/30/14 0724  TROPONINI <0.30 <0.30   BNP No components found with this basename: POCBNP,  D-Dimer No results found for this basename: DDIMER,  in the last 72 hours Hemoglobin A1C No results found for this basename: HGBA1C,  in the last 72 hours Fasting  Lipid Panel No results found for this basename: CHOL, HDL, LDLCALC, TRIG, CHOLHDL, LDLDIRECT,  in the last 72 hours Thyroid Function Tests  Recent Labs  01/30/14 0433  TSH 3.530    TELE  Marked sinus bradycardia.    ECG  ST segment depression consistent with digitalis effect   Radiology/Studies  Dg Chest Portable 1 View  01/29/2014   CLINICAL DATA:  Bradycardia. Chest pain. Hypertension. Heart failure.  EXAM: PORTABLE CHEST - 1 VIEW  COMPARISON:  None.  FINDINGS: Mildly enlarged cardiopericardial silhouette. Atherosclerotic aortic arch.  No overt edema. Slight pleural thickening at the right lung apex, likely benign/incidental.  Degenerative AC joint arthropathy bilaterally.  IMPRESSION: 1. Mildly enlarged cardiopericardial silhouette, without edema. 2. Atherosclerotic aortic arch.   Electronically Signed   By: Sherryl Barters M.D.   On: 01/29/2014 21:35    ASSESSMENT AND PLAN 1. marked sinus bradycardia  consistent with digitalis toxicity 2. hypertensive cardiovascular disease 3. diabetes mellitus 4. history of Crohn's disease 5. obstructive sleep apnea 6. constitutional symptoms and poor appetite probably secondary to digitalis toxicity. 7. Renal insufficiency--baseline unknown.  Plan: Continue off A-V node blocking agents.  Two-dimensional echocardiogram today to evaluate heart murmur and LV function.   Signed, Darlin Coco MD

## 2014-01-31 ENCOUNTER — Inpatient Hospital Stay (HOSPITAL_COMMUNITY): Payer: Medicaid Other

## 2014-01-31 DIAGNOSIS — T6591XS Toxic effect of unspecified substance, accidental (unintentional), sequela: Secondary | ICD-10-CM

## 2014-01-31 DIAGNOSIS — N179 Acute kidney failure, unspecified: Secondary | ICD-10-CM | POA: Diagnosis present

## 2014-01-31 DIAGNOSIS — T50901S Poisoning by unspecified drugs, medicaments and biological substances, accidental (unintentional), sequela: Secondary | ICD-10-CM

## 2014-01-31 LAB — GLUCOSE, CAPILLARY
Glucose-Capillary: 114 mg/dL — ABNORMAL HIGH (ref 70–99)
Glucose-Capillary: 189 mg/dL — ABNORMAL HIGH (ref 70–99)
Glucose-Capillary: 123 mg/dL — ABNORMAL HIGH (ref 70–99)
Glucose-Capillary: 120 mg/dL — ABNORMAL HIGH (ref 70–99)

## 2014-01-31 MED ORDER — HYDRALAZINE HCL 25 MG PO TABS
25.0000 mg | ORAL_TABLET | Freq: Three times a day (TID) | ORAL | Status: DC
  Administered 2014-01-31 (×2): 25 mg via ORAL
  Filled 2014-01-31 (×7): qty 1

## 2014-01-31 MED ORDER — INSULIN DETEMIR 100 UNIT/ML ~~LOC~~ SOLN
5.0000 [IU] | Freq: Every day | SUBCUTANEOUS | Status: DC
  Administered 2014-01-31 – 2014-02-01 (×2): 5 [IU] via SUBCUTANEOUS
  Filled 2014-01-31 (×2): qty 0.05

## 2014-01-31 MED ORDER — NIFEDIPINE ER 30 MG PO TB24
30.0000 mg | ORAL_TABLET | Freq: Every day | ORAL | Status: DC
  Administered 2014-01-31 – 2014-02-01 (×2): 30 mg via ORAL
  Filled 2014-01-31 (×2): qty 1

## 2014-01-31 NOTE — Progress Notes (Signed)
Subjective: No Dizziness.  Objective: Vital signs in last 24 hours: Temp:  [98 F (36.7 C)-99.1 F (37.3 C)] 98 F (36.7 C) (07/15 0927) Pulse Rate:  [45-51] 45 (07/15 0927) Resp:  [17-18] 18 (07/15 0927) BP: (156-171)/(42-75) 169/75 mmHg (07/15 0927) SpO2:  [96 %-97 %] 97 % (07/15 0927) Weight:  [166 lb 7.2 oz (75.5 kg)] 166 lb 7.2 oz (75.5 kg) (07/15 0631) Last BM Date: 01/30/14  Intake/Output from previous day: 07/14 0701 - 07/15 0700 In: 2119.2 [P.O.:720; I.V.:1399.2] Out: -  Intake/Output this shift: Total I/O In: 240 [P.O.:240] Out: -   Medications Current Facility-Administered Medications  Medication Dose Route Frequency Provider Last Rate Last Dose  . 0.9 %  sodium chloride infusion   Intravenous Continuous Toy Baker, MD 50 mL/hr at 01/31/14 0943    . acetaminophen (TYLENOL) tablet 650 mg  650 mg Oral Q6H PRN Toy Baker, MD   650 mg at 01/31/14 0718   Or  . acetaminophen (TYLENOL) suppository 650 mg  650 mg Rectal Q6H PRN Toy Baker, MD      . aspirin EC tablet 81 mg  81 mg Oral Daily Toy Baker, MD   81 mg at 01/31/14 0943  . docusate sodium (COLACE) capsule 100 mg  100 mg Oral BID Toy Baker, MD   100 mg at 01/30/14 2205  . enoxaparin (LOVENOX) injection 30 mg  30 mg Subcutaneous Q24H Toy Baker, MD   30 mg at 01/31/14 0943  . feeding supplement (GLUCERNA SHAKE) (GLUCERNA SHAKE) liquid 237 mL  237 mL Oral Q24H Baird Lyons, RD   237 mL at 01/31/14 0951  . hydrALAZINE (APRESOLINE) injection 10 mg  10 mg Intravenous Q4H PRN Toy Baker, MD      . HYDROcodone-acetaminophen (NORCO/VICODIN) 5-325 MG per tablet 1-2 tablet  1-2 tablet Oral Q4H PRN Toy Baker, MD      . insulin aspart (novoLOG) injection 0-15 Units  0-15 Units Subcutaneous TID WC Toy Baker, MD   2 Units at 01/30/14 1649  . insulin aspart (novoLOG) injection 0-5 Units  0-5 Units Subcutaneous QHS Toy Baker, MD      .  multivitamin with minerals tablet 1 tablet  1 tablet Oral Daily Baird Lyons, RD   1 tablet at 01/31/14 0943  . ondansetron (ZOFRAN) tablet 4 mg  4 mg Oral Q6H PRN Toy Baker, MD       Or  . ondansetron (ZOFRAN) injection 4 mg  4 mg Intravenous Q6H PRN Toy Baker, MD      . pantoprazole (PROTONIX) EC tablet 40 mg  40 mg Oral Daily Toy Baker, MD   40 mg at 01/31/14 0942    PE: General appearance: alert, cooperative, no distress and She is stting up in a chair getting a bath.  Smiling.  Lungs: clear to auscultation bilaterally Heart: regular rate and rhythm and 2/6 sys MM throughout. Extremities: edema No LEE Pulses: 2+ and symmetric Skin: Warm and dry Neurologic: Grossly normal  Lab Results:   Recent Labs  01/29/14 2030 01/30/14 0433  WBC 10.0 12.1*  HGB 14.6 14.1  HCT 45.0 44.5  PLT 262 255   BMET  Recent Labs  01/29/14 2030 01/30/14 0433  NA 140 145  K 5.4* 4.6  CL 102 107  CO2 22 23  GLUCOSE 127* 139*  BUN 31* 27*  CREATININE 1.79* 1.63*  CALCIUM 9.9 9.9   Studies/Results: Echo Study Conclusions  - Left ventricle: The cavity size was normal. Wall  thickness was increased in a pattern of mild LVH. Systolic function was normal. The estimated ejection fraction was in the range of 60% to 65%. Wall motion was normal; there were no regional wall motion abnormalities. Doppler parameters are consistent with pseudonormal left ventricular relaxation (grade 2 diastolic dysfunction). The E/A ratio is >1.5. The E/e&' ratio is >20, suggesting markedly elevated LV filling pressure. - Aortic valve: Heavily calcified with restricted leaflet motion. Moderate aortic stenosis. Peak and mean gradients of 41 and 21 mmHg, respectively. Based on an LVOT diameter of 1.7 cm, the calculated AVA is 1.1-1.2 cm2. There was mild regurgitation. Valve area (VTI): 1.11 cm^2. Valve area (Vmax): 1.09 cm^2. - Mitral valve: Calcified annulus. There was mild  regurgitation. - Left atrium: Severely dilated (57 ml/m2). The atrium was mildly dilated. - Right atrium: The atrium was mildly dilated. - Atrial septum: Mobile IAS. No defect or patent foramen ovale was identified. - Tricuspid valve: There was moderate regurgitation. - Pulmonary arteries: PA peak pressure: 51 mm Hg (S).  Impressions:  - LVEF 60-65%, moderate AS with AVA of 1.1-1.2 cm2, Mild MR, severe LAE, stage 2 diastolic dysfunction, moderate TR, mild AI, moderate pulmonary hypertension with RVSP of 51 mmHg.   Assessment/Plan    Principal Problem: 1. marked sinus bradycardia consistent with digitalis toxicity  Stable in the 40's-50.  Off AV nodal blocking agents.  Appears asymtomatic.  Normal EF and wall motion, grade two diastolic dysf., mod AS, Mild MR, PA pressure 70mHg, mod TR, LA sev dilation.  2. hypertensive cardiovascular disease  3. diabetes mellitus  4. history of Crohn's disease  5. obstructive sleep apnea   C-Pap 6. constitutional symptoms and poor appetite probably secondary to digitalis toxicity.  7. Renal insufficiency--baseline unknown. 8. HTN  BP elevated.  She is not on any bp meds.  Will added hydralazine 235mTID.   9. Acute renal Injury  SCr mildly improved to 1.63.  Holding ACE/ARB.     LOS: 2 days    HAGER, BRYAN PA-C 01/31/2014 11:33 AM  The patient's appetite still has not improved much.  Otherwise she feels better.  EKG and telemetry show sinus bradycardia 40-50 range.  No chest pain or shortness of breath.  Systolic blood pressure running high and hydralazine is being added to her regimen. Plan to recheck labs in a.m. Will also recheck a digoxin level.  Possible discharge tomorrow if clinically stable.

## 2014-01-31 NOTE — Progress Notes (Signed)
Pt's L arm found to be slightly edematous above PIV site. Pt c/o burning at IV site. Removed PIV, applied warm compress, and elevated arm. New PIV started in R forearm. Will continue to monitor pt closely.  Eulis Canner, RN

## 2014-01-31 NOTE — Progress Notes (Signed)
The patient's HR stayed in the upper 40s-lower 50s overnight.  Her BP is elevated, but did not meet the parameters for IV Hydralazine.  She did not have have any complaints of pain overnight and did not receive any PRN medications.

## 2014-01-31 NOTE — Progress Notes (Signed)
Patient's daughter stated that the patient complained of shortness of breath and feeling of warmth. BP is 180/68 manually and Temp is 98.4. Crackles heard over auscultation, expiratory wheezes are audible. IV infusion has been stopped and MD has been notified and made aware. New orders given. Will make receiving RN aware.

## 2014-01-31 NOTE — Progress Notes (Signed)
Report given to receiving RN. Patient in bed resting with family at bed side. No signs or symptoms of distress or discomfort noted.

## 2014-01-31 NOTE — Progress Notes (Signed)
[facility] PROGRESS NOTE   Assessment/Plan: Bradycardia due to Digoxin toxicity: - Due to dig tox, weakness and EKG depression also compatible with dig toxicity.  - Bradycardia Cont to improve. - Appreciated cardiology assistance on this case.  Hyperkalemia - Now resolved with hydration. - B-met in am.  DM (diabetes mellitus) - hold metformin with new AKI. Add low dose levemir. - cont SSI.  AKI: - Probably due to pre-renal, ACE-I.b-met in am. - Hold diuretics.  OSA on CPAP - C-pap at night.  Hypertension/Diastolic CHF - BP is high. Started her on hydralazine. - hold ACE-I and diuretics monitor. - hydralazine PRN for SBP > 180. Restart nifedipine.    Code Status: full Family Communication: family  Disposition Plan: inpatient   Consultants:  cardiology  Procedures:  none  Antibiotics:  none  HPI/Subjective: No compalins  Objective: Filed Vitals:   01/30/14 1812 01/30/14 2100 01/31/14 0631 01/31/14 0927  BP: 171/42 165/47 169/49 169/75  Pulse: 49 51 50 45  Temp: 98.2 F (36.8 C) 98.1 F (36.7 C) 98.5 F (36.9 C) 98 F (36.7 C)  TempSrc: Oral Oral Oral Oral  Resp: 18 18 17 18   Height:      Weight:   75.5 kg (166 lb 7.2 oz)   SpO2: 96% 97% 96% 97%    Intake/Output Summary (Last 24 hours) at 01/31/14 1140 Last data filed at 01/31/14 0900  Gross per 24 hour  Intake 1999.16 ml  Output      0 ml  Net 1999.16 ml   Filed Weights   01/30/14 0130 01/31/14 0631  Weight: 76.114 kg (167 lb 12.8 oz) 75.5 kg (166 lb 7.2 oz)    Exam:  General: Alert, awake, oriented x3, in no acute distress.  HEENT: No bruits, no goiter. -JVD Heart: Regular rate and rhythm. Lungs: Good air movement, clear Abdomen: Soft, nontender, nondistended, positive bowel sounds.  Neuro: Grossly intact, nonfocal.   Data Reviewed: Basic Metabolic Panel:  Recent Labs Lab 01/29/14 2030 01/30/14 0433  NA 140 145  K 5.4* 4.6  CL 102 107  CO2 22 23  GLUCOSE  127* 139*  BUN 31* 27*  CREATININE 1.79* 1.63*  CALCIUM 9.9 9.9  MG  --  2.3  PHOS  --  3.8   Liver Function Tests:  Recent Labs Lab 01/29/14 2030 01/30/14 0433  AST 31 32  ALT 60* 53*  ALKPHOS 167* 156*  BILITOT 0.4 0.6  PROT 8.8* 8.5*  ALBUMIN 4.2 4.0   No results found for this basename: LIPASE, AMYLASE,  in the last 168 hours No results found for this basename: AMMONIA,  in the last 168 hours CBC:  Recent Labs Lab 01/29/14 2030 01/30/14 0433  WBC 10.0 12.1*  HGB 14.6 14.1  HCT 45.0 44.5  MCV 91.6 91.4  PLT 262 255   Cardiac Enzymes:  Recent Labs Lab 01/30/14 0433 01/30/14 0724 01/30/14 1406  TROPONINI <0.30 <0.30 <0.30   BNP (last 3 results)  Recent Labs  01/29/14 2030  PROBNP 1482.0*   CBG:  Recent Labs Lab 01/30/14 1237 01/30/14 1641 01/30/14 2203 01/31/14 0556 01/31/14 1109  GLUCAP 98 127* 157* 114* 189*    No results found for this or any previous visit (from the past 240 hour(s)).   Studies: US Renal  01/30/2014   CLINICAL DATA:  Elevated creatinine, evaluate for kidney disease. History of diabetes and hypertension.  EXAM: RENAL/URINARY TRACT ULTRASOUND COMPLETE  COMPARISON:  None.  FINDINGS: Right Kidney:  Length: 10.8  cm. Echogenicity within normal limits. No mass or hydronephrosis visualized.  Left Kidney:  Length: 10.3 cm. Echogenicity within normal limits. No hydronephrosis visualized. 2 cm cyst over the upper pole.  Bladder:  Appears normal for degree of bladder distention.  IMPRESSION: Normal size kidneys without evidence of hydronephrosis.  2 cm left upper pole renal cyst.   Electronically Signed   By: Marin Olp M.D.   On: 01/30/2014 09:41   Dg Chest Portable 1 View  01/29/2014   CLINICAL DATA:  Bradycardia. Chest pain. Hypertension. Heart failure.  EXAM: PORTABLE CHEST - 1 VIEW  COMPARISON:  None.  FINDINGS: Mildly enlarged cardiopericardial silhouette. Atherosclerotic aortic arch.  No overt edema. Slight pleural thickening at  the right lung apex, likely benign/incidental.  Degenerative AC joint arthropathy bilaterally.  IMPRESSION: 1. Mildly enlarged cardiopericardial silhouette, without edema. 2. Atherosclerotic aortic arch.   Electronically Signed   By: Sherryl Barters M.D.   On: 01/29/2014 21:35    Scheduled Meds: . aspirin EC  81 mg Oral Daily  . docusate sodium  100 mg Oral BID  . enoxaparin (LOVENOX) injection  30 mg Subcutaneous Q24H  . feeding supplement (GLUCERNA SHAKE)  237 mL Oral Q24H  . insulin aspart  0-15 Units Subcutaneous TID WC  . insulin aspart  0-5 Units Subcutaneous QHS  . multivitamin with minerals  1 tablet Oral Daily  . pantoprazole  40 mg Oral Daily   Continuous Infusions: . sodium chloride 50 mL/hr at 01/31/14 [place], Heather [name]  [facility] Pager [phone]. If 8PM-8AM, please contact night-coverage at [email or web address], password [facility] 01/31/2014, 11:40 AM  LOS: 2 days   **Disclaimer: This note may have been dictated with voice recognition software. Similar sounding words can inadvertently be transcribed and this note may contain transcription errors which may not have been corrected upon publication of note.**

## 2014-01-31 NOTE — ED Provider Notes (Signed)
Medical screening examination/treatment/procedure(s) were conducted as a shared visit with non-physician practitioner(s) or resident and myself. I personally evaluated the patient during the encounter and agree with the findings and plan unless otherwise indicated.  I have personally reviewed any xrays and/ or EKG's with the provider and I agree with interpretation.  73 year old female Spanish-speaking presents with general weakness, mild shortness of breath and bradycardia. EKG reviewed multiple nonspecific ST changes consistent with dig toxicity. Patient is been taking drops of digoxin unknown if she's taking more than prescribed however she's been doing this for a long period time now. EKG reviewed. Acute renal failure on blood work however patient has not had any syncope or significant chest pain. On exam patient nontoxic appearing, bradycardia, abdomen soft nontender, lungs clear. Plan for admission to telemetry, holding digoxin and blood pressure medications.  Acute renal injury, digoxin toxicity, abnormal EKG   Mariea Clonts, MD 01/31/14 [phone]

## 2014-02-01 DIAGNOSIS — I509 Heart failure, unspecified: Secondary | ICD-10-CM

## 2014-02-01 DIAGNOSIS — I5031 Acute diastolic (congestive) heart failure: Secondary | ICD-10-CM

## 2014-02-01 DIAGNOSIS — N189 Chronic kidney disease, unspecified: Secondary | ICD-10-CM

## 2014-02-01 DIAGNOSIS — Y33XXXS Other specified events, undetermined intent, sequela: Secondary | ICD-10-CM

## 2014-02-01 DIAGNOSIS — E1129 Type 2 diabetes mellitus with other diabetic kidney complication: Secondary | ICD-10-CM

## 2014-02-01 LAB — BASIC METABOLIC PANEL
Anion gap: 15 (ref 5–15)
BUN: 17 mg/dL (ref 6–23)
CO2: 23 mEq/L (ref 19–32)
Calcium: 9.3 mg/dL (ref 8.4–10.5)
Chloride: 105 mEq/L (ref 96–112)
Creatinine, Ser: 1.27 mg/dL — ABNORMAL HIGH (ref 0.50–1.10)
GFR calc Af Amer: 47 mL/min — ABNORMAL LOW (ref >90)
GFR calc non Af Amer: 41 mL/min — ABNORMAL LOW (ref >90)
Glucose, Bld: 126 mg/dL — ABNORMAL HIGH (ref 70–99)
Potassium: 4.2 mEq/L (ref 3.7–5.3)
Sodium: 143 mEq/L (ref 137–147)

## 2014-02-01 LAB — DIGOXIN LEVEL: Digoxin Level: 1 ng/mL (ref 0.8–2.0)

## 2014-02-01 LAB — GLUCOSE, CAPILLARY
Glucose-Capillary: 120 mg/dL — ABNORMAL HIGH (ref 70–99)
Glucose-Capillary: 135 mg/dL — ABNORMAL HIGH (ref 70–99)

## 2014-02-01 MED ORDER — FUROSEMIDE 40 MG PO TABS: 40.0000 mg | ORAL_TABLET | Freq: Every day | ORAL | Status: DC

## 2014-02-01 MED ORDER — ZOLPIDEM TARTRATE ER 6.25 MG PO TBCR: 6.2500 mg | EXTENDED_RELEASE_TABLET | Freq: Every evening | ORAL | Status: DC | PRN

## 2014-02-01 MED ORDER — CARVEDILOL 6.25 MG PO TABS: 6.2500 mg | ORAL_TABLET | Freq: Two times a day (BID) | ORAL | Status: DC

## 2014-02-01 MED ORDER — SPIRONOLACTONE 25 MG PO TABS: 25.0000 mg | ORAL_TABLET | Freq: Every day | ORAL | Status: DC

## 2014-02-01 MED ORDER — LOSARTAN POTASSIUM 50 MG PO TABS: 50.0000 mg | ORAL_TABLET | Freq: Every day | ORAL | Status: DC

## 2014-02-01 MED ORDER — ASPIRIN 81 MG PO TBEC: 81.0000 mg | DELAYED_RELEASE_TABLET | Freq: Every day | ORAL | Status: DC

## 2014-02-01 MED ORDER — NIFEDIPINE ER 30 MG PO TB24: 30.0000 mg | ORAL_TABLET | Freq: Every day | ORAL | Status: DC

## 2014-02-01 MED ORDER — METFORMIN HCL 850 MG PO TABS: 850.0000 mg | ORAL_TABLET | Freq: Every day | ORAL | Status: DC

## 2014-02-01 NOTE — Discharge Summary (Addendum)
Physician Discharge Summary  Roy PVX:[id number] DOB: 14-Jul-1941 DOA: 01/29/2014  PCP: No primary provider on file.  Admit date: 01/29/2014 Discharge date: 02/01/2014  Time spent: 35 minutes  Recommendations for Outpatient Follow-up:  1. Follow up with PCP in 2 weeks. 2. Follow up with cardiology in 6 weeks. Titrate Bp medications as tolerate it.  Discharge Diagnoses:  Principal Problem:   Digoxin toxicity Active Problems:   Bradycardia   DM (diabetes mellitus)   OSA on CPAP   Pulmonary hypertension   CAD (coronary artery disease)   Hypertension   Diastolic CHF   Hyperkalemia   Acute renal injury   Discharge Condition: stable  Diet recommendation: low sodium  Filed Weights   01/30/14 0130 01/31/14 0631 02/01/14 0557  Weight: 76.114 kg (167 lb 12.8 oz) 75.5 kg (166 lb 7.2 oz) 75.342 kg (166 lb 1.6 oz)    History of present illness:  73 y/o Patient has recently moved from [place] was hospitalized there and was diagnosed with diastolic CHF and pulmonary HTN. She was feeling tired for the past few weeks. Family checked her vitals and found she has a pulse of 38. They brought her to ER. She was found to have digoxin level of 1.9   Hospital Course:  Bradycardia due to Digoxin toxicity:  - Due to dig tox, weakness and EKG depression also compatible with dig toxicity.  CArdiology consulted. - Held dig, hydrated the pt and asymptomatic bradycardia resolved.  Hyperkalemia  - Now resolved with hydration.  - due to pre-renal.  DM (diabetes mellitus)  - held metformin with new AKI.  - Cr improved to resume as an outpatinet.  AKI:  - Probably due to pre-renal, ACE-I.b-met in am.  - Held diuretics. - resume as an outpatient.   OSA on CPAP  - C-pap at night.   Hypertension/Diastolic CHF  - BP is high. Started her on hydralazine.  - held ACE-I and diuretics monitor.  - resume meds as an  outpatinet.  Procedures:  ECHO  Consultations:  cardiology  Discharge Exam: Filed Vitals:   02/01/14 1105  BP: 136/50  Pulse:   Temp:   Resp:     General: A&O x3 Cardiovascular: RRR Respiratory: good air movement CTA B/L  Discharge Instructions You were cared for by a hospitalist during your hospital stay. If you have any questions about your discharge medications or the care you received while you were in the hospital after you are discharged, you can call the unit and asked to speak with the hospitalist on call if the hospitalist that took care of you is not available. Once you are discharged, your primary care physician will handle any further medical issues. Please note that NO REFILLS for any discharge medications will be authorized once you are discharged, as it is imperative that you return to your primary care physician (or establish a relationship with a primary care physician if you do not have one) for your aftercare needs so that they can reassess your need for medications and monitor your lab values.      Discharge Instructions   Diet - low sodium heart healthy    Complete by:  As directed      Increase activity slowly    Complete by:  As directed             Medication List    STOP taking these medications       DIGOXIN PO      TAKE these medications  aspirin 81 MG EC tablet  Take 1 tablet (81 mg total) by mouth daily.     carvedilol 6.25 MG tablet  Commonly known as:  COREG  Take 1 tablet (6.25 mg total) by mouth 2 (two) times daily with a meal.     furosemide 40 MG tablet  Commonly known as:  LASIX  Take 1 tablet (40 mg total) by mouth daily.     losartan 50 MG tablet  Commonly known as:  COZAAR  Take 1 tablet (50 mg total) by mouth daily.     metFORMIN 850 MG tablet  Commonly known as:  GLUCOPHAGE  Take 1 tablet (850 mg total) by mouth daily.     NIFEdipine 30 MG 24 hr tablet  Commonly known as:  PROCARDIA-XL/ADALAT CC  Take 1  tablet (30 mg total) by mouth daily.     omeprazole 20 MG capsule  Commonly known as:  PRILOSEC  Take 20 mg by mouth daily.     spironolactone 25 MG tablet  Commonly known as:  ALDACTONE  Take 1 tablet (25 mg total) by mouth daily.     zolpidem 6.25 MG CR tablet  Commonly known as:  AMBIEN CR  Take 1 tablet (6.25 mg total) by mouth at bedtime as needed for sleep.       No Known Allergies Follow-up Information   Follow up with Kathlene November, MD In 2 weeks.   Specialty:  Internal Medicine   Contact information:   [phone] [facility] [address] [place] [address] [phone]       Follow up with Darlin Coco, MD On 03/12/2014. (@9 :45 am spoke with [place] )    Specialty:  Cardiology   Contact information:   [place] [address] [phone]        The results of significant diagnostics from this hospitalization (including imaging, microbiology, ancillary and laboratory) are listed below for reference.    Significant Diagnostic Studies: Dg Chest 2 View  01/31/2014   CLINICAL DATA:  Shortness of breath  EXAM: CHEST  2 VIEW  COMPARISON:  01/29/2014  FINDINGS: Mild cardiac enlargement is stable. Vascular pattern is normal. No consolidation or effusion.  IMPRESSION: No acute abnormalities.   Electronically Signed   By: Skipper Cliche M.D.   On: 01/31/2014 22:14   US Renal  01/30/2014   CLINICAL DATA:  Elevated creatinine, evaluate for kidney disease. History of diabetes and hypertension.  EXAM: RENAL/URINARY TRACT ULTRASOUND COMPLETE  COMPARISON:  None.  FINDINGS: Right Kidney:  Length: 10.8 cm. Echogenicity within normal limits. No mass or hydronephrosis visualized.  Left Kidney:  Length: 10.3 cm. Echogenicity within normal limits. No hydronephrosis visualized. 2 cm cyst over the upper pole.  Bladder:  Appears normal for degree of bladder distention.  IMPRESSION: Normal size kidneys without evidence of hydronephrosis.  2 cm left upper  pole renal cyst.   Electronically Signed   By: Marin Olp M.D.   On: 01/30/2014 09:41   Dg Chest Portable 1 View  01/29/2014   CLINICAL DATA:  Bradycardia. Chest pain. Hypertension. Heart failure.  EXAM: PORTABLE CHEST - 1 VIEW  COMPARISON:  None.  FINDINGS: Mildly enlarged cardiopericardial silhouette. Atherosclerotic aortic arch.  No overt edema. Slight pleural thickening at the right lung apex, likely benign/incidental.  Degenerative AC joint arthropathy bilaterally.  IMPRESSION: 1. Mildly enlarged cardiopericardial silhouette, without edema. 2. Atherosclerotic aortic arch.   Electronically Signed   By: Sherryl Barters M.D.  On: 01/29/2014 21:35    Microbiology: No results found for this or any previous visit (from the past 240 hour(s)).   Labs: Basic Metabolic Panel:  Recent Labs Lab 01/29/14 2030 01/30/14 0433 02/01/14 0438  NA 140 145 143  K 5.4* 4.6 4.2  CL 102 107 105  CO2 22 23 23   GLUCOSE 127* 139* 126*  BUN 31* 27* 17  CREATININE 1.79* 1.63* 1.27*  CALCIUM 9.9 9.9 9.3  MG  --  2.3  --   PHOS  --  3.8  --    Liver Function Tests:  Recent Labs Lab 01/29/14 2030 01/30/14 0433  AST 31 32  ALT 60* 53*  ALKPHOS 167* 156*  BILITOT 0.4 0.6  PROT 8.8* 8.5*  ALBUMIN 4.2 4.0   No results found for this basename: LIPASE, AMYLASE,  in the last 168 hours No results found for this basename: AMMONIA,  in the last 168 hours CBC:  Recent Labs Lab 01/29/14 2030 01/30/14 0433  WBC 10.0 12.1*  HGB 14.6 14.1  HCT 45.0 44.5  MCV 91.6 91.4  PLT 262 255   Cardiac Enzymes:  Recent Labs Lab 01/30/14 0433 01/30/14 0724 01/30/14 1406  TROPONINI <0.30 <0.30 <0.30   BNP: BNP (last 3 results)  Recent Labs  01/29/14 2030  PROBNP 1482.0*   CBG:  Recent Labs Lab 01/31/14 1109 01/31/14 1618 01/31/14 2130 02/01/14 0550 02/01/14 1159  GLUCAP 189* 123* 120* 120* 135*       Signed:  FELIZ ORTIZ, [place]  [facility] 02/01/2014, 2:37  PM

## 2014-02-01 NOTE — Progress Notes (Signed)
RX from Dr Durel Salts for  [place] given to pt's daughter on d/c to home.  Karie Kirks, Therapist, sports.

## 2014-02-01 NOTE — Discharge Instructions (Signed)
Se debe pesar todos los dias a la misma hra. Si aumentas 3 lbs en 24 horas o 5 5lbs en una semana debes llamr a tu medico.

## 2014-02-01 NOTE — Progress Notes (Signed)
All d/c instructions explained and given via phone by Spanish interpreter to pt.  Verbalized understanding.   Pt's daughter asking for sleeping med [place], which she talked with   Dr. Durel Salts this am about.  MD paged.    Karie Kirks, Therapist, sports.

## 2014-02-01 NOTE — Progress Notes (Signed)
Instructed Dr. Aileen Fass that secretary was unable to make f/u appointment for pt after d/c as instructed as  MD not taking any more medicare pt.  Dr Philippa Chester instructed that it was ok to d/c pt to home.  Karie Kirks, RN

## 2014-02-01 NOTE — Progress Notes (Signed)
Patient Name: Heather [name] Date of Encounter: 02/01/2014     Principal Problem:   Bradycardia Active Problems:   DM (diabetes mellitus)   OSA on CPAP   Pulmonary hypertension   CAD (coronary artery disease)   Hypertension   Diastolic CHF   Digoxin toxicity   Hyperkalemia   Acute renal injury    SUBJECTIVE  The patient feels well today.  No chest pain or dyspnea.  Chest x-ray yesterday showed clear lungs and mild cardiomegaly.  CURRENT MEDS . aspirin EC  81 mg Oral Daily  . docusate sodium  100 mg Oral BID  . enoxaparin (LOVENOX) injection  30 mg Subcutaneous Q24H  . feeding supplement (GLUCERNA SHAKE)  237 mL Oral Q24H  . hydrALAZINE  25 mg Oral 3 times per day  . insulin aspart  0-15 Units Subcutaneous TID WC  . insulin aspart  0-5 Units Subcutaneous QHS  . insulin detemir  5 Units Subcutaneous Daily  . multivitamin with minerals  1 tablet Oral Daily  . NIFEdipine  30 mg Oral Daily  . pantoprazole  40 mg Oral Daily    OBJECTIVE  Filed Vitals:   01/31/14 1310 01/31/14 1845 01/31/14 2118 02/01/14 0557  BP: 171/56 180/68 174/60 152/50  Pulse: 45  65 49  Temp: 98.3 F (36.8 C) 98.4 F (36.9 C) 99.2 F (37.3 C) 98.2 F (36.8 C)  TempSrc: Oral Oral Oral Oral  Resp: 20  20 18   Height:      Weight:    166 lb 1.6 oz (75.342 kg)  SpO2: 98%  94% 97%    Intake/Output Summary (Last 24 hours) at 02/01/14 0921 Last data filed at 02/01/14 0831  Gross per 24 hour  Intake 1529.17 ml  Output   2975 ml  Net -1445.83 ml   Filed Weights   01/30/14 0130 01/31/14 0631 02/01/14 0557  Weight: 167 lb 12.8 oz (76.114 kg) 166 lb 7.2 oz (75.5 kg) 166 lb 1.6 oz (75.342 kg)    PHYSICAL EXAM    Accessory Clinical Findings  CBC  Recent Labs  01/29/14 2030 01/30/14 0433  WBC 10.0 12.1*  HGB 14.6 14.1  HCT 45.0 44.5  MCV 91.6 91.4  PLT 262 622   Basic Metabolic Panel  Recent Labs  01/29/14 2030 01/30/14 0433 02/01/14 0438  NA 140 145 143  K 5.4* 4.6 4.2    CL 102 107 105  CO2 22 23 23   GLUCOSE 127* 139* 126*  BUN 31* 27* 17  CREATININE 1.79* 1.63* 1.27*  CALCIUM 9.9 9.9 9.3  MG  --  2.3  --   PHOS  --  3.8  --    Liver Function Tests  Recent Labs  01/29/14 2030 01/30/14 0433  AST 31 32  ALT 60* 53*  ALKPHOS 167* 156*  BILITOT 0.4 0.6  PROT 8.8* 8.5*  ALBUMIN 4.2 4.0   No results found for this basename: LIPASE, AMYLASE,  in the last 72 hours Cardiac Enzymes  Recent Labs  01/30/14 0433 01/30/14 0724 01/30/14 1406  TROPONINI <0.30 <0.30 <0.30   BNP No components found with this basename: POCBNP,  D-Dimer No results found for this basename: DDIMER,  in the last 72 hours Hemoglobin A1C  Recent Labs  01/30/14 0433  HGBA1C 6.4*   Fasting Lipid Panel No results found for this basename: CHOL, HDL, LDLCALC, TRIG, CHOLHDL, LDLDIRECT,  in the last 72 hours Thyroid Function Tests  Recent Labs  01/30/14 0433  TSH 3.530  TELE  Sinus bradycardia 47 per minute  ECG    Radiology/Studies  Dg Chest 2 View  01/31/2014   CLINICAL DATA:  Shortness of breath  EXAM: CHEST  2 VIEW  COMPARISON:  01/29/2014  FINDINGS: Mild cardiac enlargement is stable. Vascular pattern is normal. No consolidation or effusion.  IMPRESSION: No acute abnormalities.   Electronically Signed   By: Skipper Cliche M.D.   On: 01/31/2014 22:14   US Renal  01/30/2014   CLINICAL DATA:  Elevated creatinine, evaluate for kidney disease. History of diabetes and hypertension.  EXAM: RENAL/URINARY TRACT ULTRASOUND COMPLETE  COMPARISON:  None.  FINDINGS: Right Kidney:  Length: 10.8 cm. Echogenicity within normal limits. No mass or hydronephrosis visualized.  Left Kidney:  Length: 10.3 cm. Echogenicity within normal limits. No hydronephrosis visualized. 2 cm cyst over the upper pole.  Bladder:  Appears normal for degree of bladder distention.  IMPRESSION: Normal size kidneys without evidence of hydronephrosis.  2 cm left upper pole renal cyst.    Electronically Signed   By: Marin Olp M.D.   On: 01/30/2014 09:41   Dg Chest Portable 1 View  01/29/2014   CLINICAL DATA:  Bradycardia. Chest pain. Hypertension. Heart failure.  EXAM: PORTABLE CHEST - 1 VIEW  COMPARISON:  None.  FINDINGS: Mildly enlarged cardiopericardial silhouette. Atherosclerotic aortic arch.  No overt edema. Slight pleural thickening at the right lung apex, likely benign/incidental.  Degenerative AC joint arthropathy bilaterally.  IMPRESSION: 1. Mildly enlarged cardiopericardial silhouette, without edema. 2. Atherosclerotic aortic arch.   Electronically Signed   By: Sherryl Barters M.D.   On: 01/29/2014 21:35    ASSESSMENT AND PLAN  1. digitalis toxicity, resolved.  She is off digoxin.  Dig level is slowly declining.  Dig level is 1.0 today 2. renal insufficiency, improved with hydration and off diuretics. 3. past history of diastolic heart failure.  Ejection fraction 60-65%. 4. diabetes mellitus 5. moderate aortic stenosis heavily calcified valve  Plan: Okay for discharge home today.  She does not need to take daily diuretic but can use diuretics on a when necessary basis for weight gain or peripheral edema. She does not need digoxin going forward. She will be establishing care with a PCP here in [place] according to her daughter. I would be happy to see her in 2-3 months for cardiology followup of her aortic stenosis.  Signed, Darlin Coco MD

## 2014-02-01 NOTE — Progress Notes (Addendum)
Pt d/c  Off floor via w/c to transport to home escorted by daughter @ 1441.  Karie Kirks, Therapist, sports.

## 2014-02-12 ENCOUNTER — Inpatient Hospital Stay (HOSPITAL_COMMUNITY)
Admission: EM | Admit: 2014-02-12 | Discharge: 2014-02-15 | DRG: 243 | Disposition: A | Discharge status: Home / Self Care | Payer: Medicaid Other | Attending: Cardiovascular Disease | Admitting: Cardiovascular Disease

## 2014-02-12 ENCOUNTER — Emergency Department (HOSPITAL_COMMUNITY): Payer: PRIVATE HEALTH INSURANCE

## 2014-02-12 ENCOUNTER — Encounter (HOSPITAL_COMMUNITY)
Admission: EM | Disposition: A | Discharge status: Home / Self Care | Payer: Self-pay | Source: Home / Self Care | Attending: Cardiovascular Disease

## 2014-02-12 ENCOUNTER — Encounter (HOSPITAL_COMMUNITY): Payer: Self-pay | Admitting: Emergency Medicine

## 2014-02-12 DIAGNOSIS — K5090 Crohn's disease, unspecified, without complications: Secondary | ICD-10-CM | POA: Diagnosis present

## 2014-02-12 DIAGNOSIS — T460X1S Poisoning by cardiac-stimulant glycosides and drugs of similar action, accidental (unintentional), sequela: Secondary | ICD-10-CM

## 2014-02-12 DIAGNOSIS — E663 Overweight: Secondary | ICD-10-CM | POA: Diagnosis present

## 2014-02-12 DIAGNOSIS — I5033 Acute on chronic diastolic (congestive) heart failure: Secondary | ICD-10-CM

## 2014-02-12 DIAGNOSIS — I498 Other specified cardiac arrhythmias: Secondary | ICD-10-CM

## 2014-02-12 DIAGNOSIS — Z6829 Body mass index (BMI) 29.0-29.9, adult: Secondary | ICD-10-CM

## 2014-02-12 DIAGNOSIS — I509 Heart failure, unspecified: Secondary | ICD-10-CM | POA: Diagnosis present

## 2014-02-12 DIAGNOSIS — R001 Bradycardia, unspecified: Secondary | ICD-10-CM | POA: Diagnosis present

## 2014-02-12 DIAGNOSIS — Z7982 Long term (current) use of aspirin: Secondary | ICD-10-CM

## 2014-02-12 DIAGNOSIS — T50905A Adverse effect of unspecified drugs, medicaments and biological substances, initial encounter: Secondary | ICD-10-CM

## 2014-02-12 DIAGNOSIS — T460X5A Adverse effect of cardiac-stimulant glycosides and drugs of similar action, initial encounter: Secondary | ICD-10-CM | POA: Diagnosis present

## 2014-02-12 DIAGNOSIS — I350 Nonrheumatic aortic (valve) stenosis: Secondary | ICD-10-CM

## 2014-02-12 DIAGNOSIS — I2789 Other specified pulmonary heart diseases: Secondary | ICD-10-CM | POA: Diagnosis present

## 2014-02-12 DIAGNOSIS — I359 Nonrheumatic aortic valve disorder, unspecified: Secondary | ICD-10-CM | POA: Diagnosis present

## 2014-02-12 DIAGNOSIS — N179 Acute kidney failure, unspecified: Secondary | ICD-10-CM | POA: Diagnosis present

## 2014-02-12 DIAGNOSIS — I5030 Unspecified diastolic (congestive) heart failure: Secondary | ICD-10-CM | POA: Diagnosis present

## 2014-02-12 DIAGNOSIS — E875 Hyperkalemia: Secondary | ICD-10-CM | POA: Diagnosis present

## 2014-02-12 DIAGNOSIS — G4733 Obstructive sleep apnea (adult) (pediatric): Secondary | ICD-10-CM | POA: Diagnosis present

## 2014-02-12 DIAGNOSIS — E119 Type 2 diabetes mellitus without complications: Secondary | ICD-10-CM | POA: Diagnosis present

## 2014-02-12 DIAGNOSIS — I129 Hypertensive chronic kidney disease with stage 1 through stage 4 chronic kidney disease, or unspecified chronic kidney disease: Secondary | ICD-10-CM | POA: Diagnosis present

## 2014-02-12 DIAGNOSIS — N189 Chronic kidney disease, unspecified: Secondary | ICD-10-CM | POA: Diagnosis present

## 2014-02-12 DIAGNOSIS — I495 Sick sinus syndrome: Principal | ICD-10-CM | POA: Diagnosis present

## 2014-02-12 HISTORY — PX: TEMPORARY PACEMAKER INSERTION: SHX5471

## 2014-02-12 LAB — TSH: TSH: 1.61 u[IU]/mL (ref 0.350–4.500)

## 2014-02-12 LAB — CBC WITH DIFFERENTIAL/PLATELET
Basophils Absolute: 0.1 10*3/uL (ref 0.0–0.1)
Basophils Relative: 1 % (ref 0–1)
Eosinophils Absolute: 0.2 10*3/uL (ref 0.0–0.7)
Eosinophils Relative: 2 % (ref 0–5)
HCT: 43.7 % (ref 36.0–46.0)
Hemoglobin: 14 g/dL (ref 12.0–15.0)
Lymphocytes Relative: 29 % (ref 12–46)
Lymphs Abs: 3.4 10*3/uL (ref 0.7–4.0)
MCH: 29.1 pg (ref 26.0–34.0)
MCHC: 32 g/dL (ref 30.0–36.0)
MCV: 90.9 fL (ref 78.0–100.0)
Monocytes Absolute: 0.8 10*3/uL (ref 0.1–1.0)
Monocytes Relative: 7 % (ref 3–12)
Neutro Abs: 7.1 10*3/uL (ref 1.7–7.7)
Neutrophils Relative %: 61 % (ref 43–77)
Platelets: 311 10*3/uL (ref 150–400)
RBC: 4.81 MIL/uL (ref 3.87–5.11)
RDW: 14.8 % (ref 11.5–15.5)
WBC: 11.7 10*3/uL — ABNORMAL HIGH (ref 4.0–10.5)

## 2014-02-12 LAB — COMPREHENSIVE METABOLIC PANEL
ALT: 45 U/L — ABNORMAL HIGH (ref 0–35)
AST: 34 U/L (ref 0–37)
Albumin: 4.1 g/dL (ref 3.5–5.2)
Alkaline Phosphatase: 152 U/L — ABNORMAL HIGH (ref 39–117)
Anion gap: 16 — ABNORMAL HIGH (ref 5–15)
BUN: 59 mg/dL — ABNORMAL HIGH (ref 6–23)
CO2: 21 mEq/L (ref 19–32)
Calcium: 9.4 mg/dL (ref 8.4–10.5)
Chloride: 103 mEq/L (ref 96–112)
Creatinine, Ser: 2.3 mg/dL — ABNORMAL HIGH (ref 0.50–1.10)
GFR calc Af Amer: 23 mL/min — ABNORMAL LOW (ref >90)
GFR calc non Af Amer: 20 mL/min — ABNORMAL LOW (ref >90)
Glucose, Bld: 92 mg/dL (ref 70–99)
Potassium: 6.1 mEq/L — ABNORMAL HIGH (ref 3.7–5.3)
Sodium: 140 mEq/L (ref 137–147)
Total Bilirubin: 0.3 mg/dL (ref 0.3–1.2)
Total Protein: 8.4 g/dL — ABNORMAL HIGH (ref 6.0–8.3)

## 2014-02-12 LAB — PROTIME-INR
INR: 1.12 (ref 0.00–1.49)
Prothrombin Time: 14.4 seconds (ref 11.6–15.2)

## 2014-02-12 LAB — POTASSIUM: Potassium: 6.4 mEq/L — ABNORMAL HIGH (ref 3.7–5.3)

## 2014-02-12 LAB — TROPONIN I: Troponin I: <0.3 ng/mL (ref <0.30)

## 2014-02-12 LAB — I-STAT TROPONIN, ED: Troponin i, poc: 0.01 ng/mL (ref 0.00–0.08)

## 2014-02-12 LAB — PRO B NATRIURETIC PEPTIDE: Pro B Natriuretic peptide (BNP): 1430 pg/mL — ABNORMAL HIGH (ref 0–125)

## 2014-02-12 LAB — DIGOXIN LEVEL: Digoxin Level: <0.3 ng/mL — ABNORMAL LOW (ref 0.8–2.0)

## 2014-02-12 LAB — MRSA PCR SCREENING: MRSA by PCR: NEGATIVE

## 2014-02-12 LAB — GLUCOSE, CAPILLARY: Glucose-Capillary: 83 mg/dL (ref 70–99)

## 2014-02-12 SURGERY — TEMPORARY PACEMAKER INSERTION
Anesthesia: LOCAL

## 2014-02-12 MED ORDER — ENOXAPARIN SODIUM 40 MG/0.4ML ~~LOC~~ SOLN
40.0000 mg | SUBCUTANEOUS | Status: DC
  Administered 2014-02-12: 40 mg via SUBCUTANEOUS
  Filled 2014-02-12: qty 0.4

## 2014-02-12 MED ORDER — ASPIRIN EC 81 MG PO TBEC
81.0000 mg | DELAYED_RELEASE_TABLET | Freq: Every day | ORAL | Status: DC
  Administered 2014-02-13: 81 mg via ORAL
  Filled 2014-02-12: qty 1

## 2014-02-12 MED ORDER — SODIUM CHLORIDE 0.9 % IJ SOLN: 3.0000 mL | INTRAMUSCULAR | Status: DC | PRN

## 2014-02-12 MED ORDER — ONDANSETRON HCL 4 MG/2ML IJ SOLN: 4.0000 mg | Freq: Four times a day (QID) | INTRAMUSCULAR | Status: DC | PRN

## 2014-02-12 MED ORDER — ACETAMINOPHEN 325 MG PO TABS
650.0000 mg | ORAL_TABLET | ORAL | Status: DC | PRN
  Administered 2014-02-13 – 2014-02-15 (×4): 650 mg via ORAL
  Filled 2014-02-12 (×4): qty 2

## 2014-02-12 MED ORDER — FENTANYL CITRATE 0.05 MG/ML IJ SOLN
12.5000 ug | Freq: Once | INTRAMUSCULAR | Status: AC
  Administered 2014-02-12: 12.5 ug via INTRAVENOUS
  Filled 2014-02-12: qty 2

## 2014-02-12 MED ORDER — LIDOCAINE HCL (PF) 1 % IJ SOLN
INTRAMUSCULAR | Status: AC
  Filled 2014-02-12: qty 60

## 2014-02-12 MED ORDER — DEXTROSE 50 % IV SOLN
1.0000 | Freq: Once | INTRAVENOUS | Status: AC
  Administered 2014-02-12: 50 mL via INTRAVENOUS
  Filled 2014-02-12: qty 50

## 2014-02-12 MED ORDER — NITROGLYCERIN 0.4 MG SL SUBL: 0.4000 mg | SUBLINGUAL_TABLET | SUBLINGUAL | Status: DC | PRN

## 2014-02-12 MED ORDER — SODIUM CHLORIDE 0.9 % IV SOLN: 250.0000 mL | INTRAVENOUS | Status: DC | PRN

## 2014-02-12 MED ORDER — INSULIN ASPART 100 UNIT/ML IV SOLN
10.0000 [IU] | Freq: Once | INTRAVENOUS | Status: AC
  Administered 2014-02-12: 10 [IU] via INTRAVENOUS
  Filled 2014-02-12: qty 0.1

## 2014-02-12 MED ORDER — PANTOPRAZOLE SODIUM 40 MG PO TBEC
40.0000 mg | DELAYED_RELEASE_TABLET | Freq: Every day | ORAL | Status: DC
  Administered 2014-02-13 – 2014-02-15 (×3): 40 mg via ORAL
  Filled 2014-02-12 (×3): qty 1

## 2014-02-12 MED ORDER — ZOLPIDEM TARTRATE 5 MG PO TABS: 5.0000 mg | ORAL_TABLET | Freq: Every evening | ORAL | Status: DC | PRN

## 2014-02-12 MED ORDER — ALPRAZOLAM 0.25 MG PO TABS: 0.2500 mg | ORAL_TABLET | Freq: Two times a day (BID) | ORAL | Status: DC | PRN

## 2014-02-12 MED ORDER — HEPARIN (PORCINE) IN NACL 2-0.9 UNIT/ML-% IJ SOLN
INTRAMUSCULAR | Status: AC
  Filled 2014-02-12: qty 1000

## 2014-02-12 MED ORDER — ACETAMINOPHEN 325 MG PO TABS: 650.0000 mg | ORAL_TABLET | ORAL | Status: DC | PRN

## 2014-02-12 MED ORDER — SODIUM CHLORIDE 0.9 % IJ SOLN
3.0000 mL | Freq: Two times a day (BID) | INTRAMUSCULAR | Status: DC
  Administered 2014-02-13 – 2014-02-14 (×3): 3 mL via INTRAVENOUS

## 2014-02-12 MED ORDER — CALCIUM GLUCONATE 10 % IV SOLN
1.0000 g | Freq: Once | INTRAVENOUS | Status: DC
  Filled 2014-02-12: qty 10

## 2014-02-12 MED ORDER — INSULIN ASPART 100 UNIT/ML IV SOLN
10.0000 [IU] | Freq: Once | INTRAVENOUS | Status: DC
  Filled 2014-02-12: qty 0.1

## 2014-02-12 NOTE — CV Procedure (Signed)
Caroleann Casler is a 73 y.o. female    [id number] LOCATION:  FACILITY: [facility]  PHYSICIAN: Quay Burow, M.D. [birth year]   DATE OF PROCEDURE:  02/12/2014  DATE OF DISCHARGE:     CARDIAC CATHETERIZATION     History obtained from chart review.the patient is a 73 year old [place] female admitted with symptomatic bradycardia probably related to electrolyte abnormalities and beta blockers. She was seen by Dr. Debara Pickett in the emergency room who recommended insertion of a temporary transverse pacemaker.   PROCEDURE DESCRIPTION:   The patient was brought to the second floor [facility]iac cath lab in the postabsorptive state. She was not premedicated . Her right groinwas prepped and shaved in usual sterile fashion. Xylocaine 1% was used for local anesthesia. A 6 French sheath was inserted into the right common femoralVein using standard Seldinger technique. A 5 [place] balloontipped temporary transient pacemaker with him inserted and placed in the right ventricular apex. This patient was paced at a rate of 70 with demonstrate capture down to an MA of 2.5.   HEMODYNAMICS:    AO SYSTOLIC/AO DIASTOLIC: 863/81   IMPRESSION:successful placement of a temporary transvenous pacemaker in the RV apex with excellent sensitivities for symptomatic bradycardia related to look for abnormalities (hyperkalemia) and beta blocker which has been held. Hopefully with correction of her hypokalemia and was holding her beta blocker her intrinsic renal increase over the next 24 hours and the pacemaker will no longer be needed. The temporary transvenous pacemaker was sewn securely in place and the patient left the lab in stable condition.  Lorretta Harp MD, [facility] 02/12/2014 7:10 PM

## 2014-02-12 NOTE — ED Provider Notes (Signed)
CSN: [id number]     Arrival date & time 02/12/14  1537 History   First MD Initiated Contact with Patient 02/12/14 1551     Chief Complaint  Patient presents with  . Dizziness  . Abdominal Pain  . Chest Pain  . Headache      HPI  Patient presents with concern of ongoing fatigue, dizziness, mild epigastric chest discomfort. Patient was here 2 weeks ago, admitted for similar concerns.  On discharge she notes that her pain was improved, but her fatigue has been persistent. It is unclear when the pain began to become a present I can.  When the pain is epigastric, substernal, nonradiating, sore. There is associated fatigue with exacerbation with exertion. There is no real dyspnea, no syncope, though the patient isn't lightheaded. No vomiting, no diarrhea, no fever, chills.   Past Medical History  Diagnosis Date  . CHF (congestive heart failure)   . Hypertension   . Diabetes mellitus without complication   . Murmur, cardiac   . Pulmonary hypertension   . OSA on CPAP   . Crohn's disease    History reviewed. No pertinent past surgical history. Family History  Problem Relation Age of Onset  . Adopted: Yes   History  Substance Use Topics  . Smoking status: Never Smoker   . Smokeless tobacco: Not on file  . Alcohol Use: No   OB History   Grav Para Term Preterm Abortions TAB SAB Ect Mult Living                 Review of Systems  Constitutional:       Per HPI, otherwise negative  HENT:       Per HPI, otherwise negative  Respiratory:       Per HPI, otherwise negative  Cardiovascular:       Per HPI, otherwise negative  Gastrointestinal: Negative for vomiting.  Endocrine:       Negative aside from HPI  Genitourinary:       Neg aside from HPI   Musculoskeletal:       Per HPI, otherwise negative  Skin: Negative.   Neurological: Negative for syncope.      Allergies  Review of patient's allergies indicates no known allergies.  Home Medications   Prior to  Admission medications   Medication Sig Start Date End Date Taking? Authorizing Provider  aspirin EC 81 MG EC tablet Take 1 tablet (81 mg total) by mouth daily. 02/01/14   Charlynne Cousins, MD  carvedilol (COREG) 6.25 MG tablet Take 1 tablet (6.25 mg total) by mouth 2 (two) times daily with a meal. 02/01/14   Charlynne Cousins, MD  furosemide (LASIX) 40 MG tablet Take 1 tablet (40 mg total) by mouth daily. 02/01/14   Charlynne Cousins, MD  losartan (COZAAR) 50 MG tablet Take 1 tablet (50 mg total) by mouth daily. 02/01/14   Charlynne Cousins, MD  metFORMIN (GLUCOPHAGE) 850 MG tablet Take 1 tablet (850 mg total) by mouth daily. 02/01/14   Charlynne Cousins, MD  NIFEdipine (PROCARDIA-XL/ADALAT CC) 30 MG 24 hr tablet Take 1 tablet (30 mg total) by mouth daily. 02/01/14   Charlynne Cousins, MD  omeprazole (PRILOSEC) 20 MG capsule Take 20 mg by mouth daily.    Historical Provider, MD  spironolactone (ALDACTONE) 25 MG tablet Take 1 tablet (25 mg total) by mouth daily. 02/01/14   Charlynne Cousins, MD  zolpidem (AMBIEN CR) 6.25 MG CR tablet Take 1 tablet (6.25  mg total) by mouth at bedtime as needed for sleep. 02/01/14   Charlynne Cousins, MD   BP 117/45  Pulse 35  Temp(Src) 97.5 F (36.4 C) (Oral)  Resp 17  SpO2 99% Physical Exam  Nursing note and vitals reviewed. Constitutional: She is oriented to person, place, and time. She appears well-developed and well-nourished. No distress.  HENT:  Head: Normocephalic and atraumatic.  Eyes: Conjunctivae and EOM are normal.  Cardiovascular: Regular rhythm and intact distal pulses.  Bradycardia present.   Pulmonary/Chest: Effort normal and breath sounds normal. No stridor. No respiratory distress.  Abdominal: She exhibits no distension.  Musculoskeletal: She exhibits no edema.  Neurological: She is alert and oriented to person, place, and time. No cranial nerve deficit.  Skin: Skin is warm and dry.  Psychiatric: She has a normal mood and affect.     ED Course  Procedures (including critical care time) Labs Review Labs Reviewed  CBC WITH DIFFERENTIAL - Abnormal; Notable for the following:    WBC 11.7 (*)    All other components within normal limits  PRO B NATRIURETIC PEPTIDE  COMPREHENSIVE METABOLIC PANEL  DIGOXIN LEVEL  I-STAT TROPOININ, ED    Imaging Review Dg Chest Port 1 View  02/12/2014   CLINICAL DATA:  Dizziness and abdominal pain. Chest pain. Headache.  EXAM: PORTABLE CHEST - 1 VIEW  COMPARISON:  Two-view chest 01/31/2014  FINDINGS: The heart size is mildly enlarged. There is no edema or effusion to suggest failure. Costophrenic angles are sharp. Defibrillator pads are in place. No focal airspace disease is present. The visualized soft tissues and bony thorax are unremarkable.  IMPRESSION: 1. Mild cardiomegaly without failure. 2. No acute cardiopulmonary disease.   Electronically Signed   By: Lawrence Santiago M.D.   On: 02/12/2014 16:33     EKG Interpretation   Date/Time:  Monday February 12 2014 15:54:00 EDT Ventricular Rate:  34 PR Interval:    QRS Duration: 88 QT Interval:  464 QTC Calculation: 348 R Axis:   96 Text Interpretation:  Junctional bradycardia with retrograde conduction  Rightward axis Abnormal ECG Junctional bradycardia similar to prior  (01/29/14) Abnormal ekg Confirmed by Carmin Muskrat  MD [phone]) on  02/12/2014 4:27:02 PM     On arrival to the emergency department evaluation room, patient had resuscitation pads applied, monitors applied. On monitor the patient has heart rate 35, regular, abnormal Pulse oxygen is 97% with nasal cannula abnormal     After the initial evaluation I reviewed the patient's chart  Update: I discussed the patient's case with cardiology for assistance.  Patient's initial labs notable for hyperkalemia. In addition, patient has acute kidney injury. Discussion with cardiology, this may represent ongoing effect of long digoxin toxicity. Given the patient's recent  echocardiogram, with decreased ejection fraction, her bradycardia, fluid resuscitation may exacerbate her heart failure. Patient received dextrose and insulin in an effort to decrease her hyperkalemia, make her more appropriate for pacemaker placement.   MDM  Patient presents teaching ongoing chest pain, fatigue.  Immediately, the patient's bradycardia is noted, with junctional rhythm.  Patient had resuscitation devices, monitors placed, and after the initial evaluation I discussed her case at length with our cardiology colleagues. Patient had multiple abnormalities, including hyperkalemia, acute kidney injury.  Patient received dextrose, insulin in an effort to reduce her hyperkalemia Beyond her bradycardia, symptomatic, she will require additional evaluation of her acute kidney injury as an inpatient. With symptomatic bradycardia, hyperkalemia, her history of heart failure, patient was admitted for  emergent pacemaker placement after resuscitation in the emergency department.   CRITICAL CARE Performed by: Carmin Muskrat Total critical care time: 40 Critical care time was exclusive of separately billable procedures and treating other patients. Critical care was necessary to treat or prevent imminent or life-threatening deterioration. Critical care was time spent personally by me on the following activities: development of treatment plan with patient and/or surrogate as well as nursing, discussions with consultants, evaluation of patient's response to treatment, examination of patient, obtaining history from patient or surrogate, ordering and performing treatments and interventions, ordering and review of laboratory studies, ordering and review of radiographic studies, pulse oximetry and re-evaluation of patient's condition.   Carmin Muskrat, MD 02/12/14 [phone]

## 2014-02-12 NOTE — H&P (Signed)
Pt. Seen and examined. Agree with the NP/PA-C note as written.  73 yo [place] female, recently admitted for bradycardia, renal failure, hyperkalemia and digoxin toxcitity. Seen by Dr. Mare Ferrari and he recommended discontinuing digoxin, AVN blocking agents and her aldactone. She had an echo which showed EF 60-65%, moderate AS with AVA of 1.0-1.1 cm2, pulmonary hypertension with PA pressure of 51 and severe LAE. There was grade 2 diastolic dysfunction.  On discharge, her HR had improved into the 60's, however, she was discharged on coreg and aldactone.  Subsequently, she was re-admitted today with fatigue, weakness and dyspnea. She is markedly bradycardic with HR of 30 (sinus and junctional beats), she is also hyperkalemic at 6.1 with acute on chronic renal failure (Creatinine 2.3) - also concerning with recent digoxin toxicity.  I believe she is symptomatic secondary to her moderate aortic stenosis - which is probably dependent on cardiac output (reduced due to lower HR).   The safest way to increase her CO is to increase her HR electrically - dopamine would introduce more risk, especially as it may be pro-arrythmic.  I would recommend a temporary pacing wire now, discontinue her coreg and aldactone and continue to hold these medications (I do not see an indication for aldactone anyway). Hopefully, if the medication washes out, we can avoid a permanent pacemaker.   Pixie Casino, MD, [facility] Attending Cardiologist [place]

## 2014-02-12 NOTE — H&P (Signed)
History and Physical   Patient ID: Heather [name] MRN: [id number], DOB/AGE: December 20, 1940 73 y.o. Date of Encounter: 02/12/2014  Primary Physician: In [place], none here Primary Cardiologist: Dr. Mare Ferrari  Chief Complaint:  Chest pain  HPI: Heather [name] is a 73 y.o. female with no history of CAD. She has a history of CHF, HTN, DM, pHTN, Crohns disease, and OSA on CPAP. She was initially seen by cardiology July 13th when she was admitted with abdominal pain and bradycardia. She had been on digoxin, this was d/c'd. Her heart rate was in the 40s at rest at discharge, but she was asymptomatic.    Past Medical History  Diagnosis Date  . CHF (congestive heart failure)   . Hypertension   . Diabetes mellitus without complication   . Murmur, cardiac   . Pulmonary hypertension   . OSA on CPAP   . Crohn's disease     Surgical History:  Past Surgical History  Procedure Laterality Date  . None       I have reviewed the patient's current medications. Medication Sig Start Date  aspirin EC 81 MG EC tablet Take 1 tablet (81 mg total) by mouth daily. 02/01/14  carvedilol (COREG) 6.25 MG tablet Take 1 tablet (6.25 mg total) by mouth 2 (two) times daily with a meal. 02/01/14  furosemide (LASIX) 40 MG tablet Take 1 tablet (40 mg total) by mouth daily. 02/01/14  losartan (COZAAR) 50 MG tablet Take 1 tablet (50 mg total) by mouth daily. 02/01/14  metFORMIN (GLUCOPHAGE) 850 MG tablet Take 1 tablet (850 mg total) by mouth daily. 02/01/14  NIFEdipine (PROCARDIA-XL/ADALAT CC) 30 MG 24 hr tablet Take 1 tablet (30 mg total) by mouth daily. 02/01/14  omeprazole (PRILOSEC) 20 MG capsule Take 20 mg by mouth 2 (two) times daily.    spironolactone (ALDACTONE) 25 MG tablet Take 1 tablet (25 mg total) by mouth daily. 02/01/14   Allergies: No Known Allergies  History   Social History  . Marital Status: Widowed    Spouse Name: N/A    Number of Children: N/A  . Years of Education: N/A   Occupational  History  . Not on file.   Social History Main Topics  . Smoking status: Never Smoker   . Smokeless tobacco: Not on file  . Alcohol Use: No  . Drug Use: No  . Sexual Activity: Not on file   Other Topics Concern  . Not on file   Social History Narrative   Moved here from [place] in October 2014.    Family History  Problem Relation Age of Onset  . Adopted: Yes   Family Status  Relation Status Death Age  . Mother Deceased   . Father Deceased     Review of Systems:   Full 14-point review of systems otherwise negative except as noted above.  Physical Exam: Blood pressure 116/47, pulse 31, temperature 97.5 F (36.4 C), temperature source Oral, resp. rate 15, SpO2 98.00%. General: Well developed, well nourished,female in no acute distress. Head: Normocephalic, atraumatic, sclera non-icteric, no xanthomas, nares are without discharge. Dentition: poor Neck: No carotid bruits. JVD not elevated. No thyromegally Lungs: Good expansion bilaterally. without wheezes or rhonchi.  Heart: Regular rate and rhythm with S1 S2.  No S3 or S4.  + murmur, no rubs, or gallops appreciated. Abdomen: Soft, non-tender, non-distended with normoactive bowel sounds. No hepatomegaly. No rebound/guarding. No obvious abdominal masses. Msk:  Strength and tone appear normal for age. No joint deformities or  effusions, no spine or costo-vertebral angle tenderness. Extremities: No clubbing or cyanosis. No edema.  Distal pedal pulses are 2+ in 4 extrem Neuro: Alert and oriented X 3. Moves all extremities spontaneously. No focal deficits noted. Psych:  Responds to questions appropriately with a normal affect. Skin: No rashes or lesions noted  Labs:   Lab Results  Component Value Date   WBC 11.7* 02/12/2014   HGB 14.0 02/12/2014   HCT 43.7 02/12/2014   MCV 90.9 02/12/2014   PLT 311 02/12/2014     Recent Labs Lab 02/12/14 1604  NA 140  K 6.1*  CL 103  CO2 21  BUN 59*  CREATININE 2.30*  CALCIUM 9.4  PROT  8.4*  BILITOT 0.3  ALKPHOS 152*  ALT 45*  AST 34  GLUCOSE 92    Recent Labs  02/12/14 1611  TROPIPOC 0.01   Pro B Natriuretic peptide (BNP)  Date/Time Value Ref Range Status  02/12/2014  4:04 PM 1430.0* 0 - 125 pg/mL Final  01/29/2014  8:30 PM 1482.0* 0 - 125 pg/mL Final    Radiology/Studies: Dg Chest Port 1 View 02/12/2014   CLINICAL DATA:  Dizziness and abdominal pain. Chest pain. Headache.  EXAM: PORTABLE CHEST - 1 VIEW  COMPARISON:  Two-view chest 01/31/2014  FINDINGS: The heart size is mildly enlarged. There is no edema or effusion to suggest failure. Costophrenic angles are sharp. Defibrillator pads are in place. No focal airspace disease is present. The visualized soft tissues and bony thorax are unremarkable.  IMPRESSION: 1. Mild cardiomegaly without failure. 2. No acute cardiopulmonary disease.   Electronically Signed   By: Lawrence Santiago M.D.   On: 02/12/2014 16:33   Echo: 01/30/2014 Study Conclusions - Left ventricle: The cavity size was normal. Wall thickness was increased in a pattern of mild LVH. Systolic function was normal. The estimated ejection fraction was in the range of 60% to 65%. Wall motion was normal; there were no regional wall motion abnormalities. Doppler parameters are consistent with pseudonormal left ventricular relaxation (grade 2 diastolic dysfunction). The E/A ratio is >1.5. The E/e&' ratio is >20, suggesting markedly elevated LV filling pressure. - Aortic valve: Heavily calcified with restricted leaflet motion. Moderate aortic stenosis. Peak and mean gradients of 41 and 21 mmHg, respectively. Based on an LVOT diameter of 1.7 cm, the calculated AVA is 1.1-1.2 cm2. There was mild regurgitation. Valve area (VTI): 1.11 cm^2. Valve area (Vmax): 1.09 cm^2. - Mitral valve: Calcified annulus. There was mild regurgitation. - Left atrium: Severely dilated (57 ml/m2). The atrium was mildly dilated. - Right atrium: The atrium was mildly dilated. - Atrial  septum: Mobile IAS. No defect or patent foramen ovale was identified. - Tricuspid valve: There was moderate regurgitation. - Pulmonary arteries: PA peak pressure: 51 mm Hg (S). Impressions: - LVEF 60-65%, moderate AS with AVA of 1.1-1.2 cm2, Mild MR, severe LAE, stage 2 diastolic dysfunction, moderate TR, mild AI, moderate pulmonary hypertension with RVSP of 51 mmHg.  ECG: Sinus bradycardia  ASSESSMENT AND PLAN:  Principal Problem:   Symptomatic bradycardia - may be multifactorial from Coreg +/- hyperkalemia. Will d/c Coreg, recheck K+, if still high, treat. Temporary PPM vs Permanent PPM per MD.  Active Problems:   DM (diabetes mellitus) - SSI, hold glucophage w/ renal insufficiency    OSA on CPAP - continue in-hosp    Hyperkalemia - recheck pending, d/c aldactone, may need to treat    Acute renal failure -    Jonetta Speak, PA-C 02/12/2014 5:37  PM Beeper [phone]

## 2014-02-12 NOTE — ED Notes (Signed)
Barrett, PA at bedside.

## 2014-02-12 NOTE — ED Notes (Addendum)
Pharmacy notified re: need for calcium gluconate for hyperkalemia tx  I have discontinued the calcium order due to concern for persistent dig toxicity. Will proceed with glucose and insulin. She will need kayexelate later.  Pixie Casino, MD, [facility] Attending Cardiologist [place]

## 2014-02-12 NOTE — ED Notes (Signed)
Pt is here with dizziness that started this am and had low bp at home and low heart rate.  Pt reports headache for two days and reports epigastric pain and neck pain

## 2014-02-12 NOTE — Interval H&P Note (Signed)
History and Physical Interval Note:  02/12/2014 6:52 PM  Heather [name]  has presented today for surgery, with the diagnosis of bradycardia  The various methods of treatment have been discussed with the patient and family. After consideration of risks, benefits and other options for treatment, the patient has consented to  Procedure(s): TEMPORARY PACEMAKER INSERTION (N/A) as a surgical intervention .  The patient's history has been reviewed, patient examined, no change in status, stable for surgery.  I have reviewed the patient's chart and labs.  Questions were answered to the patient's satisfaction.     Lorretta Harp

## 2014-02-13 DIAGNOSIS — I359 Nonrheumatic aortic valve disorder, unspecified: Secondary | ICD-10-CM

## 2014-02-13 DIAGNOSIS — I495 Sick sinus syndrome: Principal | ICD-10-CM

## 2014-02-13 LAB — COMPREHENSIVE METABOLIC PANEL
ALT: 40 U/L — ABNORMAL HIGH (ref 0–35)
AST: 28 U/L (ref 0–37)
Albumin: 3.8 g/dL (ref 3.5–5.2)
Alkaline Phosphatase: 142 U/L — ABNORMAL HIGH (ref 39–117)
Anion gap: 16 — ABNORMAL HIGH (ref 5–15)
BUN: 54 mg/dL — ABNORMAL HIGH (ref 6–23)
CO2: 20 mEq/L (ref 19–32)
Calcium: 9.2 mg/dL (ref 8.4–10.5)
Chloride: 105 mEq/L (ref 96–112)
Creatinine, Ser: 1.83 mg/dL — ABNORMAL HIGH (ref 0.50–1.10)
GFR calc Af Amer: 30 mL/min — ABNORMAL LOW (ref >90)
GFR calc non Af Amer: 26 mL/min — ABNORMAL LOW (ref >90)
Glucose, Bld: 88 mg/dL (ref 70–99)
Potassium: 5.3 mEq/L (ref 3.7–5.3)
Sodium: 141 mEq/L (ref 137–147)
Total Bilirubin: 0.4 mg/dL (ref 0.3–1.2)
Total Protein: 7.9 g/dL (ref 6.0–8.3)

## 2014-02-13 LAB — BASIC METABOLIC PANEL
Anion gap: 16 — ABNORMAL HIGH (ref 5–15)
BUN: 48 mg/dL — ABNORMAL HIGH (ref 6–23)
CO2: 20 mEq/L (ref 19–32)
Calcium: 9.2 mg/dL (ref 8.4–10.5)
Chloride: 105 mEq/L (ref 96–112)
Creatinine, Ser: 1.81 mg/dL — ABNORMAL HIGH (ref 0.50–1.10)
GFR calc Af Amer: 31 mL/min — ABNORMAL LOW (ref >90)
GFR calc non Af Amer: 26 mL/min — ABNORMAL LOW (ref >90)
Glucose, Bld: 100 mg/dL — ABNORMAL HIGH (ref 70–99)
Potassium: 5.2 mEq/L (ref 3.7–5.3)
Sodium: 141 mEq/L (ref 137–147)

## 2014-02-13 LAB — TROPONIN I
Troponin I: <0.3 ng/mL (ref <0.30)
Troponin I: <0.3 ng/mL (ref <0.30)

## 2014-02-13 MED ORDER — SODIUM CHLORIDE 0.9 % IR SOLN
80.0000 mg | Status: DC
  Filled 2014-02-13: qty 2

## 2014-02-13 MED ORDER — CHLORHEXIDINE GLUCONATE 4 % EX LIQD
60.0000 mL | Freq: Once | CUTANEOUS | Status: AC
  Administered 2014-02-14: 4 via TOPICAL
  Filled 2014-02-13: qty 60

## 2014-02-13 MED ORDER — CHLORHEXIDINE GLUCONATE 4 % EX LIQD
60.0000 mL | Freq: Once | CUTANEOUS | Status: AC
  Administered 2014-02-13: 4 via TOPICAL
  Filled 2014-02-13: qty 60

## 2014-02-13 MED ORDER — GLUCERNA SHAKE PO LIQD
237.0000 mL | Freq: Every day | ORAL | Status: DC
  Administered 2014-02-14: 237 mL via ORAL

## 2014-02-13 MED ORDER — CEFAZOLIN SODIUM-DEXTROSE 2-3 GM-% IV SOLR
2.0000 g | INTRAVENOUS | Status: DC
  Filled 2014-02-13: qty 50

## 2014-02-13 MED ORDER — ENOXAPARIN SODIUM 30 MG/0.3ML ~~LOC~~ SOLN
30.0000 mg | SUBCUTANEOUS | Status: DC
  Filled 2014-02-13: qty 0.3

## 2014-02-13 NOTE — Progress Notes (Signed)
Placed patient on CPAP via nasal mask, auto titrate settings.  Patient tolerating well at this time. RN aware.

## 2014-02-13 NOTE — Progress Notes (Signed)
Orthopedic Tech Progress Note Patient Details:  Heather [name] [birth year] [id number]  Ortho Devices Type of Ortho Device: Knee Immobilizer   Katheren Shams 02/13/2014, 6:34 AM

## 2014-02-13 NOTE — Consult Note (Addendum)
ELECTROPHYSIOLOGY CONSULT NOTE    Patient ID: Heather [name] MRN: [id number], DOB/AGE: 1941/03/27 73 y.o.  Admit date: 02/12/2014 Date of Consult: 02-13-14  Primary Physician: No primary provider on file. Primary Cardiologist: Mare Ferrari  Reason for Consultation: bradycardia  HPI:  Heather [name] is a 73 y.o. female with a past medical history significant for hypertension, diabetes, sleep apnea, and Chron's disease.  She recently moved here from [place]. She was admitted on 7-13 for bradycardia.  At that time, her digoxin was discontinued and her heart rates improved. She was discharged to home on Coreg.  She reports increasing fatigue, gait instability, and dizziness for the last several days.  She was brought to [facility] for further evaluation and was found to be in sinus bradycardia.  Her Coreg has been held (last dose 7-27 am).  She was also in acute renal failure.  A temporary pacing wire was placed for backup brady pacing.  Her pacing rate has been turned down today to allow for intrinsic conduction.  She is currently sinus brady in the 30's.  She has midsternal chest pressure which resolves at higher heart rates  Echocardiogram 01-30-14 demonstrated EF 36-62%, grade 2 diastolic dysfunction, moderate AS, mild MR, LA 57, moderate pulmonary hypertension.   She currently denies shortness of breath.  She has not had palpitations or frank syncope.  ROS is otherwise negative.   EP has been asked to evaluate for treatment options.   Past Medical History  Diagnosis Date  . CHF (congestive heart failure)   . Hypertension   . Diabetes mellitus without complication   . Murmur, cardiac   . Pulmonary hypertension   . OSA on CPAP   . Crohn's disease      Surgical History:  Past Surgical History  Procedure Laterality Date  . None       Prescriptions prior to admission  Medication Sig Dispense Refill  . aspirin EC 81 MG EC tablet Take 1 tablet (81 mg total) by mouth daily.  30 tablet  0  .  carvedilol (COREG) 6.25 MG tablet Take 1 tablet (6.25 mg total) by mouth 2 (two) times daily with a meal.  60 tablet  3  . furosemide (LASIX) 40 MG tablet Take 1 tablet (40 mg total) by mouth daily.  30 tablet  3  . losartan (COZAAR) 50 MG tablet Take 1 tablet (50 mg total) by mouth daily.  30 tablet  0  . metFORMIN (GLUCOPHAGE) 850 MG tablet Take 1 tablet (850 mg total) by mouth daily.  30 tablet  3  . NIFEdipine (PROCARDIA-XL/ADALAT CC) 30 MG 24 hr tablet Take 1 tablet (30 mg total) by mouth daily.  30 tablet  3  . omeprazole (PRILOSEC) 20 MG capsule Take 20 mg by mouth 2 (two) times daily.       Marland Kitchen spironolactone (ALDACTONE) 25 MG tablet Take 1 tablet (25 mg total) by mouth daily.  30 tablet  3    Inpatient Medications:  . aspirin EC  81 mg Oral Daily  . enoxaparin (LOVENOX) injection  30 mg Subcutaneous Q24H  . pantoprazole  40 mg Oral Daily  . sodium chloride  3 mL Intravenous Q12H    Allergies: No Known Allergies  History   Social History  . Marital Status: Widowed    Spouse Name: N/A    Number of Children: N/A  . Years of Education: N/A   Occupational History  . Not on file.   Social History Main Topics  . Smoking  status: Never Smoker   . Smokeless tobacco: Not on file  . Alcohol Use: No  . Drug Use: No  . Sexual Activity: Not on file   Other Topics Concern  . Not on file   Social History Narrative   Moved here from [place] in October 2014.     Family History  Problem Relation Age of Onset  . Adopted: Yes    BP 123/50  Pulse 75  Temp(Src) 97.8 F (36.6 C) (Oral)  Resp 18  Ht 5' 2"  (1.575 m)  Wt 162 lb 11.2 oz (73.8 kg)  BMI 29.75 kg/m2  SpO2 99%  Physical Exam: Physical Exam: Filed Vitals:   02/13/14 1200 02/13/14 1300 02/13/14 1500 02/13/14 1632  BP: 102/56 126/39 116/56 158/130  Pulse:      Temp:    98.6 F (37 C)  TempSrc:    Oral  Resp: 18 20 17 17   Height:      Weight:      SpO2: 94% 96% 94% 97%    GEN- The patient is overweight  appearing, alert and oriented x 3 today.   Head- normocephalic, atraumatic Eyes-  Sclera clear, conjunctiva pink Ears- hearing intact Oropharynx- clear Neck- supple, Lungs- Clear to ausculation bilaterally, normal work of breathing Heart- Regular rate and rhythm (paced) GI- soft, NT, ND, + BS Extremities- no clubbing, cyanosis, or edema  MS- no significant deformity or atrophy Skin- no rash or lesion Psych- euthymic mood, full affect Neuro- strength and sensation are intact    Labs:   Lab Results  Component Value Date   WBC 11.7* 02/12/2014   HGB 14.0 02/12/2014   HCT 43.7 02/12/2014   MCV 90.9 02/12/2014   PLT 311 02/12/2014    Recent Labs Lab 02/13/14 0130  NA 141  K 5.3  CL 105  CO2 20  BUN 54*  CREATININE 1.83*  CALCIUM 9.2  PROT 7.9  BILITOT 0.4  ALKPHOS 142*  ALT 40*  AST 28  GLUCOSE 88   Lab Results  Component Value Date   TROPONINI <0.30 02/13/2014    Radiology/Studies:Dg Chest Port 1 View 02/12/2014   CLINICAL DATA:  Dizziness and abdominal pain. Chest pain. Headache.  EXAM: PORTABLE CHEST - 1 VIEW  COMPARISON:  Two-view chest 01/31/2014  FINDINGS: The heart size is mildly enlarged. There is no edema or effusion to suggest failure. Costophrenic angles are sharp. Defibrillator pads are in place. No focal airspace disease is present. The visualized soft tissues and bony thorax are unremarkable.  IMPRESSION: 1. Mild cardiomegaly without failure. 2. No acute cardiopulmonary disease.   Electronically Signed   By: Lawrence Santiago M.D.   On: 02/12/2014 16:33    EKG: multiple ekgs are reviewed which reveal junctional rhythm  TELEMETRY: sinus brady, rates 30's, intermittent ventricular pacing  A/P 1. Sick sinus syndrome The patient presents with symptomatic sinus bradycardia.  She has been off of digoxin for 2 weeks and has also been off of coreg for 2 days.  Her heart rates have not recovered and she is very symptomatic with bradycardia. No reversible causes have  been found.  I would therefore recommend pacemaker implantation at this time.  Risks, benefits, alternatives to pacemaker implantation were discussed in detail with the patient today (using daughter as a Nutritional therapist). The patient understands that the risks include but are not limited to bleeding, infection, pneumothorax, perforation, tamponade, vascular damage, renal failure, MI, stroke, death,  and lead dislodgement and wishes to proceed. We will therefore schedule  the procedure tomorrow with Dr Caryl Comes.  Ms Hubbard Robinson family is very pleasant and attentive.  They are anxious because of her persistent symptomatic bradycardia and readmissions and say that they would feel "much better" if she were to have a pacemaker implanted.

## 2014-02-13 NOTE — Progress Notes (Signed)
Pt now complaining of chest pain and feeling anxious. She states that is has been since the "tall Dr" (Dr Aundra Dubin) changed her rate.  Notified Dr Aundra Dubin of this and he instructed me to turn her temp. Pacer back up to 70 bpm.  Rate change verified by 2 RN's.   Loni Muse, RN

## 2014-02-13 NOTE — Care Management Note (Signed)
    Page 1 of 1   02/13/2014     7:43:27 AM CARE MANAGEMENT NOTE 02/13/2014  Patient:  Heather [name], Heather [name]   Account Number:  [id number]  Date Initiated:  02/13/2014  Documentation initiated by:  Elissa Hefty  Subjective/Objective Assessment:   adm w brady     Action/Plan:   lives w fam   Anticipated DC Date:     Anticipated DC Plan:           Choice offered to / List presented to:             Status of service:   Medicare Important Message given?   (If response is "NO", the following Medicare IM given date fields will be blank) Date Medicare IM given:   Medicare IM given by:   Date Additional Medicare IM given:   Additional Medicare IM given by:    Discharge Disposition:    Per UR Regulation:  Reviewed for med. necessity/level of care/duration of stay  If discussed at [place] of Stay Meetings, dates discussed:    Comments:

## 2014-02-13 NOTE — Progress Notes (Signed)
INITIAL NUTRITION ASSESSMENT  DOCUMENTATION CODES Per approved criteria  -Obesity Unspecified   INTERVENTION: Glucerna Shake po daily, each supplement provides 220 kcal and 10 grams of protein RD to follow for nutrition care plan  NUTRITION DIAGNOSIS: Inadequate oral intake related to limited appetite as evidenced by PO intake 50%  Goal: Pt to meet >/= 90% of their estimated nutrition needs   Monitor:  PO & supplemental intake, weight, labs, I/O's  Reason for Assessment: Malnutrition Screening Tool Report  73 y.o. female  Admitting Dx: Symptomatic bradycardia  ASSESSMENT: 73 y.o. female with no history of CAD. She has a history of CHF, HTN, DM, pHTN, Crohns disease, and OSA on CPAP. She was initially seen by cardiology July 13th when she was admitted with abdominal pain and bradycardia. She had been on digoxin, this was D/C'd. Re-admitted for symptomatic bradycardia.  Patient s/p procedure 7/27: CARDIAC CATHETERIZATION  Patient seen per Clinical Nutrition during recent hospitalization; pt with hx of poor PO intake and weight loss of 10% in the past year (not significant for time frame) since she started taking Metformin; current PO intake ~ 50% per flowsheet records; received Glucerna Shake during recent hospital stay; RD to order at this time.  Nutrition Focused Physical Exam:   Subcutaneous Fat:  Orbital Region: WNL Upper Arm Region: mild wasting  Thoracic and Lumbar Region: WNL  Muscle:  Temple Region: mild wasting  Clavicle Bone Region: WNL Clavicle and Acromion Bone Region: WNL Scapular Bone Region: N/A  Dorsal Hand: WNL Patellar Region: WNL Anterior Thigh Region: wnl  Posterior Calf Region: wnl   Edema: none   Height: Ht Readings from Last 1 Encounters:  02/12/14 5' 2"  (1.575 m)    Weight: Wt Readings from Last 1 Encounters:  02/13/14 162 lb 11.2 oz (73.8 kg)    Ideal Body Weight: 110 lb  % Ideal Body Weight: 147%  Wt Readings from Last 10  Encounters:  02/13/14 162 lb 11.2 oz (73.8 kg)  02/13/14 162 lb 11.2 oz (73.8 kg)  02/01/14 166 lb 1.6 oz (75.342 kg)    Usual Body Weight: 166 lb  % Usual Body Weight: 97%  BMI:  Body mass index is 29.75 kg/(m^2).  Estimated Nutritional Needs: Kcal: 1600-1800 Protein: 80-90 gm Fluid: 1.6-1.8 L  Skin: Intact  Diet Order: Heart Healthy/Carbohydrate Modified   EDUCATION NEEDS: -No education needs identified at this time   Intake/Output Summary (Last 24 hours) at 02/13/14 1413 Last data filed at 02/13/14 1400  Gross per 24 hour  Intake    480 ml  Output   1550 ml  Net  -1070 ml    Labs:   Recent Labs Lab 02/12/14 1604 02/12/14 1710 02/13/14 0130  NA 140  --  141  K 6.1* 6.4* 5.3  CL 103  --  105  CO2 21  --  20  BUN 59*  --  54*  CREATININE 2.30*  --  1.83*  CALCIUM 9.4  --  9.2  GLUCOSE 92  --  88    CBG (last 3)   Recent Labs  02/12/14 2042  GLUCAP 83    Scheduled Meds: . aspirin EC  81 mg Oral Daily  . enoxaparin (LOVENOX) injection  30 mg Subcutaneous Q24H  . pantoprazole  40 mg Oral Daily  . sodium chloride  3 mL Intravenous Q12H    Continuous Infusions:   Past Medical History  Diagnosis Date  . CHF (congestive heart failure)   . Hypertension   . Diabetes  mellitus without complication   . Murmur, cardiac   . Pulmonary hypertension   . OSA on CPAP   . Crohn's disease     Past Surgical History  Procedure Laterality Date  . None      Arthur Holms, RD, LDN Pager #: [phone] After-Hours Pager #: [phone]

## 2014-02-13 NOTE — Progress Notes (Addendum)
Patient ID: Heather [name], female   DOB: 09-10-1940, 73 y.o.   MRN: [id number]   SUBJECTIVE: Patient comfortable in bed, temporary wire in place, pacing at 70 bpm.  I turned down rate to 35, patient has sinus bradycardia in the low 40s.  BP is 125/38.  Threshold on TTVP 0.6 mA, left at 5 mA and backup pacing at 35 bpm to see how she does through the day today.   Scheduled Meds: . aspirin EC  81 mg Oral Daily  . enoxaparin (LOVENOX) injection  30 mg Subcutaneous Q24H  . pantoprazole  40 mg Oral Daily  . sodium chloride  3 mL Intravenous Q12H   Continuous Infusions:  PRN Meds:.sodium chloride, acetaminophen, ALPRAZolam, nitroGLYCERIN, ondansetron (ZOFRAN) IV, sodium chloride, zolpidem    Filed Vitals:   02/13/14 0500 02/13/14 0600 02/13/14 0700 02/13/14 0708  BP: 109/45 107/52 123/50 123/50  Pulse:      Temp:    97.8 F (36.6 C)  TempSrc:    Oral  Resp: 16 18 15 18   Height:      Weight:      SpO2: 94% 96% 94% 99%    Intake/Output Summary (Last 24 hours) at 02/13/14 0912 Last data filed at 02/13/14 0714  Gross per 24 hour  Intake    180 ml  Output   1350 ml  Net  -1170 ml    LABS: Basic Metabolic Panel:  Recent Labs  02/12/14 1604 02/12/14 1710 02/13/14 0130  NA 140  --  141  K 6.1* 6.4* 5.3  CL 103  --  105  CO2 21  --  20  GLUCOSE 92  --  88  BUN 59*  --  54*  CREATININE 2.30*  --  1.83*  CALCIUM 9.4  --  9.2   Liver Function Tests:  Recent Labs  02/12/14 1604 02/13/14 0130  AST 34 28  ALT 45* 40*  ALKPHOS 152* 142*  BILITOT 0.3 0.4  PROT 8.4* 7.9  ALBUMIN 4.1 3.8   No results found for this basename: LIPASE, AMYLASE,  in the last 72 hours CBC:  Recent Labs  02/12/14 1604  WBC 11.7*  NEUTROABS 7.1  HGB 14.0  HCT 43.7  MCV 90.9  PLT 311   Cardiac Enzymes:  Recent Labs  02/12/14 2126 02/13/14 0130 02/13/14 0720  TROPONINI <0.30 <0.30 <0.30   BNP: No components found with this basename: POCBNP,  D-Dimer: No results found for this  basename: DDIMER,  in the last 72 hours Hemoglobin A1C: No results found for this basename: HGBA1C,  in the last 72 hours Fasting Lipid Panel: No results found for this basename: CHOL, HDL, LDLCALC, TRIG, CHOLHDL, LDLDIRECT,  in the last 72 hours Thyroid Function Tests:  Recent Labs  02/12/14 2126  TSH 1.610   Anemia Panel: No results found for this basename: VITAMINB12, FOLATE, FERRITIN, TIBC, IRON, RETICCTPCT,  in the last 72 hours  RADIOLOGY: Dg Chest 2 View  01/31/2014   CLINICAL DATA:  Shortness of breath  EXAM: CHEST  2 VIEW  COMPARISON:  01/29/2014  FINDINGS: Mild cardiac enlargement is stable. Vascular pattern is normal. No consolidation or effusion.  IMPRESSION: No acute abnormalities.   Electronically Signed   By: Skipper Cliche M.D.   On: 01/31/2014 22:14   US Renal  01/30/2014   CLINICAL DATA:  Elevated creatinine, evaluate for kidney disease. History of diabetes and hypertension.  EXAM: RENAL/URINARY TRACT ULTRASOUND COMPLETE  COMPARISON:  None.  FINDINGS: Right Kidney:  Length:  10.8 cm. Echogenicity within normal limits. No mass or hydronephrosis visualized.  Left Kidney:  Length: 10.3 cm. Echogenicity within normal limits. No hydronephrosis visualized. 2 cm cyst over the upper pole.  Bladder:  Appears normal for degree of bladder distention.  IMPRESSION: Normal size kidneys without evidence of hydronephrosis.  2 cm left upper pole renal cyst.   Electronically Signed   By: Marin Olp M.D.   On: 01/30/2014 09:41   Dg Chest Port 1 View  02/12/2014   CLINICAL DATA:  Dizziness and abdominal pain. Chest pain. Headache.  EXAM: PORTABLE CHEST - 1 VIEW  COMPARISON:  Two-view chest 01/31/2014  FINDINGS: The heart size is mildly enlarged. There is no edema or effusion to suggest failure. Costophrenic angles are sharp. Defibrillator pads are in place. No focal airspace disease is present. The visualized soft tissues and bony thorax are unremarkable.  IMPRESSION: 1. Mild cardiomegaly  without failure. 2. No acute cardiopulmonary disease.   Electronically Signed   By: Lawrence Santiago M.D.   On: 02/12/2014 16:33   Dg Chest Portable 1 View  01/29/2014   CLINICAL DATA:  Bradycardia. Chest pain. Hypertension. Heart failure.  EXAM: PORTABLE CHEST - 1 VIEW  COMPARISON:  None.  FINDINGS: Mildly enlarged cardiopericardial silhouette. Atherosclerotic aortic arch.  No overt edema. Slight pleural thickening at the right lung apex, likely benign/incidental.  Degenerative AC joint arthropathy bilaterally.  IMPRESSION: 1. Mildly enlarged cardiopericardial silhouette, without edema. 2. Atherosclerotic aortic arch.   Electronically Signed   By: Sherryl Barters M.D.   On: 01/29/2014 21:35    PHYSICAL EXAM General: NAD Neck: No JVD, no thyromegaly or thyroid nodule.  Lungs: Clear to auscultation bilaterally with normal respiratory effort. CV: Nondisplaced PMI.  Heart regular S1/S2, no S3/S4, 2/6 SEM RUSB.  No peripheral edema.   Abdomen: Soft, nontender, no hepatosplenomegaly, no distention.  Neurologic: Alert and oriented x 3.  Psych: Normal affect. Extremities: No clubbing or cyanosis.   TELEMETRY: Reviewed telemetry pt in sinus brady in low 40s  ASSESSMENT AND PLAN: 73 yo with moderate AS had recent admission with AKI, bradycardia, hyperkalemia, and digoxin toxicity.  She was sent home off digoxin but on spironolactone and Coreg.  She returned with bradycardia and recurrent AKI/hyperkalemia, TTVP placed.  1. Bradycardia: Now off Coreg and K is improved.  Rate still low, sinus brady low 40s.  BP preserved.  I left backup pacing down to 35 bpm to see how she does today.  If BP begins to fall, will need to increase rate.  May require PPM for sick sinus syndrome.  Will have EP evaluate today.  2. Aortic stenosis: Moderate by recent echo.  3. AKI: Improving today.  K down to 5.3.   Loralie Champagne 02/13/2014 9:19 AM

## 2014-02-14 ENCOUNTER — Encounter (HOSPITAL_COMMUNITY)
Admission: EM | Disposition: A | Discharge status: Home / Self Care | Payer: Self-pay | Source: Home / Self Care | Attending: Cardiovascular Disease

## 2014-02-14 DIAGNOSIS — I495 Sick sinus syndrome: Secondary | ICD-10-CM

## 2014-02-14 HISTORY — PX: PERMANENT PACEMAKER INSERTION: SHX5480

## 2014-02-14 HISTORY — PX: INSERT / REPLACE / REMOVE PACEMAKER: SUR710

## 2014-02-14 LAB — BASIC METABOLIC PANEL
Anion gap: 15 (ref 5–15)
BUN: 46 mg/dL — ABNORMAL HIGH (ref 6–23)
CO2: 21 mEq/L (ref 19–32)
Calcium: 9.3 mg/dL (ref 8.4–10.5)
Chloride: 104 mEq/L (ref 96–112)
Creatinine, Ser: 1.77 mg/dL — ABNORMAL HIGH (ref 0.50–1.10)
GFR calc Af Amer: 31 mL/min — ABNORMAL LOW (ref >90)
GFR calc non Af Amer: 27 mL/min — ABNORMAL LOW (ref >90)
Glucose, Bld: 107 mg/dL — ABNORMAL HIGH (ref 70–99)
Potassium: 5.5 mEq/L — ABNORMAL HIGH (ref 3.7–5.3)
Sodium: 140 mEq/L (ref 137–147)

## 2014-02-14 LAB — CBC
HCT: 46.9 % — ABNORMAL HIGH (ref 36.0–46.0)
Hemoglobin: 14.9 g/dL (ref 12.0–15.0)
MCH: 28.5 pg (ref 26.0–34.0)
MCHC: 31.8 g/dL (ref 30.0–36.0)
MCV: 89.8 fL (ref 78.0–100.0)
Platelets: 290 10*3/uL (ref 150–400)
RBC: 5.22 MIL/uL — ABNORMAL HIGH (ref 3.87–5.11)
RDW: 14.6 % (ref 11.5–15.5)
WBC: 10.8 10*3/uL — ABNORMAL HIGH (ref 4.0–10.5)

## 2014-02-14 LAB — GLUCOSE, CAPILLARY
Glucose-Capillary: 164 mg/dL — ABNORMAL HIGH (ref 70–99)
Glucose-Capillary: 95 mg/dL (ref 70–99)

## 2014-02-14 SURGERY — PERMANENT PACEMAKER INSERTION
Anesthesia: LOCAL

## 2014-02-14 MED ORDER — ACETAMINOPHEN 325 MG PO TABS: 325.0000 mg | ORAL_TABLET | ORAL | Status: DC | PRN

## 2014-02-14 MED ORDER — MIDAZOLAM HCL 5 MG/5ML IJ SOLN
INTRAMUSCULAR | Status: AC
  Filled 2014-02-14: qty 5

## 2014-02-14 MED ORDER — SODIUM CHLORIDE 0.9 % IV SOLN
INTRAVENOUS | Status: AC
  Administered 2014-02-14: 16:00:00 via INTRAVENOUS

## 2014-02-14 MED ORDER — HEPARIN (PORCINE) IN NACL 2-0.9 UNIT/ML-% IJ SOLN
INTRAMUSCULAR | Status: AC
  Filled 2014-02-14: qty 500

## 2014-02-14 MED ORDER — ONDANSETRON HCL 4 MG/2ML IJ SOLN: 4.0000 mg | Freq: Four times a day (QID) | INTRAMUSCULAR | Status: DC | PRN

## 2014-02-14 MED ORDER — CEFAZOLIN SODIUM 1-5 GM-% IV SOLN
1.0000 g | Freq: Two times a day (BID) | INTRAVENOUS | Status: DC
  Administered 2014-02-14: 1 g via INTRAVENOUS
  Filled 2014-02-14 (×2): qty 50

## 2014-02-14 MED ORDER — FENTANYL CITRATE 0.05 MG/ML IJ SOLN
INTRAMUSCULAR | Status: AC
  Filled 2014-02-14: qty 2

## 2014-02-14 MED ORDER — LIDOCAINE HCL (PF) 1 % IJ SOLN
INTRAMUSCULAR | Status: AC
  Filled 2014-02-14: qty 60

## 2014-02-14 NOTE — Progress Notes (Signed)
Pt taken to cath lab for PPM placement. Family aware and in 2900 waiting area. Translator, [place], accompanied patient to cath lab.  Loni Muse, Rn

## 2014-02-14 NOTE — Progress Notes (Signed)
RT note:  Patient placed on CPAP via nasal mask by RN.  Machine settings auto titrate.

## 2014-02-14 NOTE — Progress Notes (Signed)
Pt placed on auto CPAP with a nasal mask.

## 2014-02-14 NOTE — Progress Notes (Signed)
Patient arrived to unit per stretcher.  Patient alert, oriented, verbally responsive, breathing regular and non-labored throughout, no s/s of distress noted throughout, no c/o pain throughout.  Patient and family oriented to unit and room, needs attended to.  VS WNL.  Will continue to monitor.  Dirk Dress 02/14/2014

## 2014-02-14 NOTE — Progress Notes (Signed)
   ELECTROPHYSIOLOGY ROUNDING NOTE    Patient Name: Heather [name] Date of Encounter: 02/14/2014    SUBJECTIVE:Patient feels well this morning.  No chest pain or shortness of breath.  Family at bedside  Renal function improving. Creat 1.81 --> 1.77.  K 5.5 this morning.  Temp wire right groin.   TELEMETRY: Reviewed telemetry pt in V pacing at 70 Filed Vitals:   02/14/14 0400 02/14/14 0412 02/14/14 0500 02/14/14 0600  BP: 114/47  115/44 125/73  Pulse:      Temp:  98.5 F (36.9 C)    TempSrc:  Oral    Resp: 10  18 20   Height:      Weight:   164 lb 0.4 oz (74.4 kg)   SpO2: 98%  95% 97%  PHYSICAL EXAM Well developed and nourished in no acute distress HENT normal Neck supple   Clear Regular rate and rhythm, 2/31mAbd-soft with active BS No Clubbing cyanosis edema  Leg in immobilizer Skin-warm and dry A & Oriented  Grossly normal sensory and motor function    Intake/Output Summary (Last 24 hours) at 02/14/14 0643 Last data filed at 02/14/14 0600  Gross per 24 hour  Intake    470 ml  Output   1200 ml  Net   -730 ml    CURRENT MEDICATIONS: .  ceFAZolin (ANCEF) IV  2 g Intravenous On Call  . chlorhexidine  60 mL Topical Once  . feeding supplement (GLUCERNA SHAKE)  237 mL Oral Q1500  . gentamicin irrigation  80 mg Irrigation On Call  . pantoprazole  40 mg Oral Daily  . sodium chloride  3 mL Intravenous Q12H    LABS: Basic Metabolic Panel:  Recent Labs  02/13/14 1535 02/14/14 0306  NA 141 140  K 5.2 5.5*  CL 105 104  CO2 20 21  GLUCOSE 100* 107*  BUN 48* 46*  CREATININE 1.81* 1.77*  CALCIUM 9.2 9.3   Liver Function Tests:  Recent Labs  02/12/14 1604 02/13/14 0130  AST 34 28  ALT 45* 40*  ALKPHOS 152* 142*  BILITOT 0.3 0.4  PROT 8.4* 7.9  ALBUMIN 4.1 3.8   CBC:  Recent Labs  02/12/14 1604 02/14/14 0306  WBC 11.7* 10.8*  NEUTROABS 7.1  --   HGB 14.0 14.9  HCT 43.7 46.9*  MCV 90.9 89.8  PLT 311 290   Cardiac Enzymes:  Recent Labs  02/12/14 2126 02/13/14 0130 02/13/14 0720  TROPONINI <0.30 <0.30 <0.30   Thyroid Function Tests:  Recent Labs  02/12/14 2126  TSH 1.610    Radiology/Studies:  Dg Chest 2 View 01/31/2014   CLINICAL DATA:  Shortness of breath  EXAM: CHEST  2 VIEW  COMPARISON:  01/29/2014  FINDINGS: Mild cardiac enlargement is stable. Vascular pattern is normal. No consolidation or effusion.  IMPRESSION: No acute abnormalities.   Electronically Signed   By: RSkipper ClicheM.D.   On: 01/31/2014 22:14        Principal Problem:   Symptomatic bradycardia Active Problems:   DM (diabetes mellitus)   OSA on CPAP   Hyperkalemia   Acute renal injury   Bradycardia, drug induced   The benefits and risks were reviewed including but not limited to death,  perforation, infection, lead dislodgement and device malfunction.  The patient understands agrees and is willing to proceed.

## 2014-02-14 NOTE — Clinical Documentation Improvement (Signed)
  Thank you for documenting patient's CHF with type, Diastolic. Please also document if this is Chronic or Acute to show greater specificity for severity of illness and risk of mortality. Thanks again.  Thank You, Ezekiel Ina ,RN Clinical Documentation Specialist:  [phone]  [facility] [employer] [facility]

## 2014-02-14 NOTE — Op Note (Signed)
NAMECELISA, Heather [name] NO.:  [id number]  MEDICAL RECORD NO.:  [id number]  LOCATION:  [id number]                        FACILITY:  [facility]  PHYSICIAN:  Deboraha Sprang, MD, FACCDATE OF BIRTH:  [birth year]  DATE OF PROCEDURE:  02/14/2014 DATE OF DISCHARGE:                              OPERATIVE REPORT   PREOPERATIVE DIAGNOSIS:  Sinus node dysfunction - symptomatic.  POSTOPERATIVE DIAGNOSIS:  Sinus node dysfunction - symptomatic.  PROCEDURE:  Dual-chamber pacemaker implantation.  DESCRIPTION OF PROCEDURE:  Following obtaining informed consent, the patient was brought to the electrophysiology laboratory and placed on the fluoroscopic table in supine position.  After routine prep and drape of the left upper chest, lidocaine was infiltrated in the prepectoral subclavicular region.  Incision was made and carried down to layer of the prepectoral fascia using electrocautery and sharp dissection.  A pocket was formed similarly.  Hemostasis was obtained.  Thereafter, attention was turned to gain access to the extrathoracic left subclavian vein, which was accomplished without difficulty without the aspiration or puncture of the artery.  Two separate venipunctures were accomplished.  Guidewires were placed and retained.  Sequentially 7- French sheaths were placed through which were passed a Medtronic 5076 52 cm active fixation MRI compatible lead, serial number [id number] and a Medtronic 5076 45 cm MRI conditional lea, serial number [id number]. Under fluoroscopic guidance, these leads were manipulated to the right ventricular apex and the right atrial appendage respectively where the bipolar amplitude was 12 mV with a pace impedance was 1300 with threshold 0.5 V at 0.5 milliseconds, and now 1 V at 0.5 milliseconds. Current of injury was brisk.  There was no diaphragmatic pacing at 10 V. The lead was secured to the prepectoral fascia.  The atrial lead had an amplitude of 2.9 with a  pace impedance of 702 with threshold 1.3 V at 0.5 milliseconds.  Current of injury was brisk. There was no diaphragmatic pacing at 10 V.  This lead was secured to the prepectoral fascia as well and the leads were then attached to Medtronic Advisa MRI conditional device, serial number [id number].  Atrial pacing was identified.  Pocket was copiously irrigated with antibiotic-containing saline solution.  Hemostasis was assured.  Leads and pulse generator placed in the pocket, secured with prepectoral fascia.  The wound was then closed in 2 layers in normal fashion.  The wound was washed, dried, and a Dermabond dressing was applied.  Needle counts, sponge counts, and instrument counts were correct at the end of the procedure according to the staff.     Deboraha Sprang, MD, [facility]     SCK/MEDQ  D:  02/14/2014  T:  02/14/2014  Job:  [id number]

## 2014-02-15 ENCOUNTER — Encounter (HOSPITAL_COMMUNITY): Payer: Self-pay | Admitting: Emergency Medicine

## 2014-02-15 ENCOUNTER — Emergency Department (HOSPITAL_COMMUNITY): Payer: PRIVATE HEALTH INSURANCE

## 2014-02-15 ENCOUNTER — Inpatient Hospital Stay (HOSPITAL_COMMUNITY)
Admission: EM | Admit: 2014-02-15 | Discharge: 2014-02-20 | DRG: 872 | Disposition: A | Discharge status: Home / Self Care | Payer: Medicaid Other | Attending: Internal Medicine | Admitting: Internal Medicine

## 2014-02-15 ENCOUNTER — Inpatient Hospital Stay (HOSPITAL_COMMUNITY): Payer: PRIVATE HEALTH INSURANCE

## 2014-02-15 DIAGNOSIS — E7800 Pure hypercholesterolemia, unspecified: Secondary | ICD-10-CM | POA: Diagnosis present

## 2014-02-15 DIAGNOSIS — M549 Dorsalgia, unspecified: Secondary | ICD-10-CM | POA: Diagnosis present

## 2014-02-15 DIAGNOSIS — I498 Other specified cardiac arrhythmias: Secondary | ICD-10-CM | POA: Diagnosis present

## 2014-02-15 DIAGNOSIS — I2789 Other specified pulmonary heart diseases: Secondary | ICD-10-CM | POA: Diagnosis present

## 2014-02-15 DIAGNOSIS — K5090 Crohn's disease, unspecified, without complications: Secondary | ICD-10-CM | POA: Diagnosis present

## 2014-02-15 DIAGNOSIS — G4733 Obstructive sleep apnea (adult) (pediatric): Secondary | ICD-10-CM | POA: Diagnosis present

## 2014-02-15 DIAGNOSIS — E1159 Type 2 diabetes mellitus with other circulatory complications: Secondary | ICD-10-CM | POA: Diagnosis present

## 2014-02-15 DIAGNOSIS — Z950 Presence of cardiac pacemaker: Secondary | ICD-10-CM

## 2014-02-15 DIAGNOSIS — I272 Pulmonary hypertension, unspecified: Secondary | ICD-10-CM

## 2014-02-15 DIAGNOSIS — I5032 Chronic diastolic (congestive) heart failure: Secondary | ICD-10-CM

## 2014-02-15 DIAGNOSIS — R001 Bradycardia, unspecified: Secondary | ICD-10-CM | POA: Diagnosis present

## 2014-02-15 DIAGNOSIS — I5030 Unspecified diastolic (congestive) heart failure: Secondary | ICD-10-CM | POA: Diagnosis present

## 2014-02-15 DIAGNOSIS — K219 Gastro-esophageal reflux disease without esophagitis: Secondary | ICD-10-CM | POA: Diagnosis present

## 2014-02-15 DIAGNOSIS — E119 Type 2 diabetes mellitus without complications: Secondary | ICD-10-CM | POA: Diagnosis present

## 2014-02-15 DIAGNOSIS — I509 Heart failure, unspecified: Secondary | ICD-10-CM | POA: Diagnosis present

## 2014-02-15 DIAGNOSIS — R011 Cardiac murmur, unspecified: Secondary | ICD-10-CM | POA: Diagnosis present

## 2014-02-15 DIAGNOSIS — N289 Disorder of kidney and ureter, unspecified: Secondary | ICD-10-CM

## 2014-02-15 DIAGNOSIS — A419 Sepsis, unspecified organism: Secondary | ICD-10-CM

## 2014-02-15 DIAGNOSIS — I10 Essential (primary) hypertension: Secondary | ICD-10-CM | POA: Diagnosis present

## 2014-02-15 DIAGNOSIS — R509 Fever, unspecified: Secondary | ICD-10-CM

## 2014-02-15 DIAGNOSIS — Z85828 Personal history of other malignant neoplasm of skin: Secondary | ICD-10-CM

## 2014-02-15 DIAGNOSIS — G8929 Other chronic pain: Secondary | ICD-10-CM | POA: Diagnosis present

## 2014-02-15 DIAGNOSIS — E875 Hyperkalemia: Secondary | ICD-10-CM

## 2014-02-15 HISTORY — DX: Gastro-esophageal reflux disease without esophagitis: K21.9

## 2014-02-15 HISTORY — DX: Unspecified malignant neoplasm of skin, unspecified: C44.90

## 2014-02-15 HISTORY — DX: Presence of cardiac pacemaker: Z95.0

## 2014-02-15 HISTORY — DX: Migraine, unspecified, not intractable, without status migrainosus: G43.909

## 2014-02-15 HISTORY — DX: Other chronic pain: G89.29

## 2014-02-15 HISTORY — DX: Dorsalgia, unspecified: M54.9

## 2014-02-15 HISTORY — DX: Pure hypercholesterolemia, unspecified: E78.00

## 2014-02-15 HISTORY — DX: Bradycardia, unspecified: R00.1

## 2014-02-15 LAB — BASIC METABOLIC PANEL
Anion gap: 16 — ABNORMAL HIGH (ref 5–15)
BUN: 45 mg/dL — ABNORMAL HIGH (ref 6–23)
CO2: 20 mEq/L (ref 19–32)
Calcium: 9.2 mg/dL (ref 8.4–10.5)
Chloride: 104 mEq/L (ref 96–112)
Creatinine, Ser: 1.79 mg/dL — ABNORMAL HIGH (ref 0.50–1.10)
GFR calc Af Amer: 31 mL/min — ABNORMAL LOW (ref >90)
GFR calc non Af Amer: 27 mL/min — ABNORMAL LOW (ref >90)
Glucose, Bld: 91 mg/dL (ref 70–99)
Potassium: 4.9 mEq/L (ref 3.7–5.3)
Sodium: 140 mEq/L (ref 137–147)

## 2014-02-15 MED ORDER — VANCOMYCIN HCL IN DEXTROSE 1-5 GM/200ML-% IV SOLN
1000.0000 mg | Freq: Once | INTRAVENOUS | Status: AC
  Administered 2014-02-16: 1000 mg via INTRAVENOUS
  Filled 2014-02-15: qty 200

## 2014-02-15 NOTE — Progress Notes (Signed)
   ELECTROPHYSIOLOGY ROUNDING NOTE    Patient Name: Heather [name] Date of Encounter: 02/15/2014    SUBJECTIVE:Patient feels well this morning.  No chest pain or shortness of breath. Minimal incisional soreness.  States she is symptomatically improved.   TELEMETRY: Reviewed telemetry pt in atrial pacing w intrinsic ventricular conduction Filed Vitals:   02/14/14 1424 02/14/14 2043 02/14/14 2200 02/15/14 0534  BP: 135/59 132/72  135/88  Pulse: 70 70 79 76  Temp: 98.4 F (36.9 C) 98.2 F (36.8 C)  98.9 F (37.2 C)  TempSrc: Oral Oral  Oral  Resp: 18 18 16 18   Height:      Weight:    163 lb 2.3 oz (74 kg)  SpO2: 97% 96% 98% 96%    Intake/Output Summary (Last 24 hours) at 02/15/14 0636 Last data filed at 02/14/14 1614  Gross per 24 hour  Intake     50 ml  Output    150 ml  Net   -100 ml    CURRENT MEDICATIONS: .  ceFAZolin (ANCEF) IV  1 g Intravenous Q12H  . feeding supplement (GLUCERNA SHAKE)  237 mL Oral Q1500  . pantoprazole  40 mg Oral Daily  . sodium chloride  3 mL Intravenous Q12H    LABS: Basic Metabolic Panel:  Recent Labs  02/13/14 1535 02/14/14 0306  NA 141 140  K 5.2 5.5*  CL 105 104  CO2 20 21  GLUCOSE 100* 107*  BUN 48* 46*  CREATININE 1.81* 1.77*  CALCIUM 9.2 9.3   Liver Function Tests:  Recent Labs  02/12/14 1604 02/13/14 0130  AST 34 28  ALT 45* 40*  ALKPHOS 152* 142*  BILITOT 0.3 0.4  PROT 8.4* 7.9  ALBUMIN 4.1 3.8   CBC:  Recent Labs  02/12/14 1604 02/14/14 0306  WBC 11.7* 10.8*  NEUTROABS 7.1  --   HGB 14.0 14.9  HCT 43.7 46.9*  MCV 90.9 89.8  PLT 311 290   Cardiac Enzymes:  Recent Labs  02/12/14 2126 02/13/14 0130 02/13/14 0720  TROPONINI <0.30 <0.30 <0.30   Thyroid Function Tests:  Recent Labs  02/12/14 2126  TSH 1.610    Radiology/Studies:  Final result pending, leads in stable position.  PHYSICAL EXAM Well developed and nourished in no acute distress HENT normal Neck supple with  JVP-flat Clear Pocket wtout hematoma Regular rate and rhythm, no murmurs or gallops Abd-soft with active BS No Clubbing cyanosis edema Skin-warm and dry A & Oriented  Grossly normal sensory and motor function    DEVICE INTERROGATION: Device interrogation pending  Principal Problem:   Symptomatic bradycardia Active Problems:   DM (diabetes mellitus)   OSA on CPAP   Hyperkalemia   Acute renal injury   Bradycardia, drug induced   Wound care, restrictions reviewed with patient. Routine follow up scheduled.

## 2014-02-15 NOTE — ED Notes (Signed)
Pt returned from X-ray.  

## 2014-02-15 NOTE — ED Provider Notes (Signed)
CSN: [id number]     Arrival date & time 02/15/14  2230 History   First MD Initiated Contact with Patient 02/15/14 2256     Chief Complaint  Patient presents with  . Weakness     (Consider location/radiation/quality/duration/timing/severity/associated sxs/prior Treatment) Patient is a 73 y.o. female presenting with weakness. The history is provided by the patient.  Weakness  She was discharged from the hospital this morning following insertion of a permanent pacemaker. She is doing well until about 9 PM when she developed chills and felt generally weak. Denies cough, chest pain, nausea, vomiting.  Past Medical History  Diagnosis Date  . CHF (congestive heart failure)   . Hypertension   . Diabetes mellitus without complication   . Murmur, cardiac   . Pulmonary hypertension   . OSA on CPAP   . Crohn's disease    Past Surgical History  Procedure Laterality Date  . None     Family History  Problem Relation Age of Onset  . Adopted: Yes   History  Substance Use Topics  . Smoking status: Never Smoker   . Smokeless tobacco: Not on file  . Alcohol Use: No   OB History   Grav Para Term Preterm Abortions TAB SAB Ect Mult Living                 Review of Systems  Neurological: Positive for weakness.  All other systems reviewed and are negative.     Allergies  Review of patient's allergies indicates no known allergies.  Home Medications   Prior to Admission medications   Medication Sig Start Date End Date Taking? Authorizing Provider  aspirin EC 81 MG EC tablet Take 1 tablet (81 mg total) by mouth daily. 02/01/14  Yes Charlynne Cousins, MD  carvedilol (COREG) 6.25 MG tablet Take 1 tablet (6.25 mg total) by mouth 2 (two) times daily with a meal. 02/01/14  Yes Charlynne Cousins, MD  furosemide (LASIX) 40 MG tablet Take 1 tablet (40 mg total) by mouth daily. 02/01/14  Yes Charlynne Cousins, MD  losartan (COZAAR) 50 MG tablet Take 1 tablet (50 mg total) by mouth daily.  02/01/14  Yes Charlynne Cousins, MD  metFORMIN (GLUCOPHAGE) 850 MG tablet Take 1 tablet (850 mg total) by mouth daily. 02/01/14  Yes Charlynne Cousins, MD  omeprazole (PRILOSEC) 20 MG capsule Take 20 mg by mouth 2 (two) times daily.    Yes Historical Provider, MD   BP 128/45  Pulse 71  Temp(Src) 101.3 F (38.5 C) (Oral)  Resp 19  SpO2 96% Physical Exam  Nursing note and vitals reviewed.  73 year old female, resting comfortably and in no acute distress. Vital signs are significant for fever with temperature 101.3. Oxygen saturation is 96%, which is normal. Head is normocephalic and atraumatic. PERRLA, EOMI. Oropharynx is clear. Neck is nontender and supple without adenopathy or JVD. Back is nontender and there is no CVA tenderness. Lungs are clear without rales, wheezes, or rhonchi. Chest: Pacemaker is present in left subclavian area with OpSite dressing over it. There is mild erythema of the area and mild puffiness with moderate tenderness to palpation. There is no drainage. Heart has regular rate and rhythm with 5-3/2 systolic ejection murmur. Abdomen is soft, flat, nontender without masses or hepatosplenomegaly and peristalsis is normoactive. Extremities have no cyanosis or edema, full range of motion is present. Skin is warm and dry without rash. Neurologic: Mental status is normal, cranial nerves are intact, there are  no motor or sensory deficits.  ED Course  Procedures (including critical care time) Labs Review Results for orders placed during the hospital encounter of 02/15/14  CBC WITH DIFFERENTIAL      Result Value Ref Range   WBC 20.0 (*) 4.0 - 10.5 K/uL   RBC 4.89  3.87 - 5.11 MIL/uL   Hemoglobin 14.6  12.0 - 15.0 g/dL   HCT 44.6  36.0 - 46.0 %   MCV 91.2  78.0 - 100.0 fL   MCH 29.9  26.0 - 34.0 pg   MCHC 32.7  30.0 - 36.0 g/dL   RDW 14.4  11.5 - 15.5 %   Platelets 230  150 - 400 K/uL   Neutrophils Relative % 88 (*) 43 - 77 %   Neutro Abs 17.5 (*) 1.7 - 7.7 K/uL    Lymphocytes Relative 6 (*) 12 - 46 %   Lymphs Abs 1.3  0.7 - 4.0 K/uL   Monocytes Relative 6  3 - 12 %   Monocytes Absolute 1.2 (*) 0.1 - 1.0 K/uL   Eosinophils Relative 0  0 - 5 %   Eosinophils Absolute 0.0  0.0 - 0.7 K/uL   Basophils Relative 0  0 - 1 %   Basophils Absolute 0.1  0.0 - 0.1 K/uL  BASIC METABOLIC PANEL      Result Value Ref Range   Sodium 135 (*) 137 - 147 mEq/L   Potassium 5.4 (*) 3.7 - 5.3 mEq/L   Chloride 104  96 - 112 mEq/L   CO2 15 (*) 19 - 32 mEq/L   Glucose, Bld 124 (*) 70 - 99 mg/dL   BUN 48 (*) 6 - 23 mg/dL   Creatinine, Ser 1.77 (*) 0.50 - 1.10 mg/dL   Calcium 9.2  8.4 - 10.5 mg/dL   GFR calc non Af Amer 27 (*) >90 mL/min   GFR calc Af Amer 31 (*) >90 mL/min   Anion gap 16 (*) 5 - 15  URINALYSIS, ROUTINE W REFLEX MICROSCOPIC      Result Value Ref Range   Color, Urine YELLOW  YELLOW   APPearance CLEAR  CLEAR   Specific Gravity, Urine 1.013  1.005 - 1.030   pH 5.0  5.0 - 8.0   Glucose, UA NEGATIVE  NEGATIVE mg/dL   Hgb urine dipstick NEGATIVE  NEGATIVE   Bilirubin Urine NEGATIVE  NEGATIVE   Ketones, ur NEGATIVE  NEGATIVE mg/dL   Protein, ur NEGATIVE  NEGATIVE mg/dL   Urobilinogen, UA 1.0  0.0 - 1.0 mg/dL   Nitrite NEGATIVE  NEGATIVE   Leukocytes, UA NEGATIVE  NEGATIVE   Imaging Review Dg Chest 2 View  02/15/2014   CLINICAL DATA:  Pacemaker placement.  EXAM: CHEST  2 VIEW  COMPARISON:  One-view chest 02/12/2014.  FINDINGS: The heart is mildly enlarged. There is no edema or effusion to suggest failure. Minimal bibasilar atelectasis is evident. A dual-chamber pacemaker is in place via a left subclavian approach. The leads are in the right atrium and right ventricle. Mild curvature is again noted within the upper lumbar spine. The visualized soft tissues and bony thorax are otherwise unremarkable.  IMPRESSION: 1. Interval placement of dual lead pacemaker without radiographic evidence for complication. 2. Minimal bibasilar atelectasis. 3. Borderline  cardiomegaly without failure.   Electronically Signed   By: Lawrence Santiago M.D.   On: 02/15/2014 07:48     EKG Interpretation   Date/Time:  Thursday February 15 2014 22:40:04 EDT Ventricular Rate:  70 PR Interval:  189 QRS Duration: 93 QT Interval:  359 QTC Calculation: 387 R Axis:   44 Text Interpretation:  Atrial-paced rhythm Nonspecific repolarization  abnormalities When compared with ECG of 02/15/2014, T wave inversion  Inferior leads and  Anterior leads is less pronounced Confirmed by [facility]   MD, Makynlie Rossini ([id number]) on 02/15/2014 11:05:36 PM      MDM   Final diagnoses:  Fever chills  Renal insufficiency    Fever following insertion of a permanent pacemaker. I am concerned about possibility of a pacemaker pocket infection. Screening labs are obtained and consultation will be obtained with cardiology.  Case has been discussed with Dr. Aundra Dubin of cardiology who requests that she be placed on the hospitalist service. Case is discussed with Dr. Shanon Brow of [facility] who agrees to admit the patient.  Delora Fuel, MD 35/33/17 [id number]

## 2014-02-15 NOTE — Progress Notes (Signed)
Patient discharged to home.  Patient alert, oriented, verbally responsive, breathing regular and non-labored throughout, no s/s of distress noted throughout, no c/o pain throughout.  Discharge instructions verbalized to patients daughter at bedside.  Daughter verbalized understanding throughout.  Patients daughter informed per PA Amber that it is ok to restart patients Coreg.  Patient and daughter agreeable.  Patient left unit per wheelchair accompanied by daughter and Engineer, manufacturing. VS WNL.  Dirk Dress 02/15/2014

## 2014-02-15 NOTE — Discharge Summary (Signed)
ELECTROPHYSIOLOGY PROCEDURE DISCHARGE SUMMARY    Patient ID: Heather [name],  MRN: [id number], DOB/AGE: 1940/08/22 73 y.o.  Admit date: 02/12/2014 Discharge date: 02/15/2014  Primary Care Physician: No primary provider on file. Primary Cardiologist: Mare Ferrari Electrophysiologist: Caryl Comes  Primary Discharge Diagnosis:  Symptomatic bradycardia status post pacemaker implantation this admission  Secondary Discharge Diagnosis:  1.  Hypertension 2.  Diabetes 3.  Sleep apnea 4.  Chron's disease  No Known Allergies   Procedures This Admission:  1.  Insertion of temporary pacemaker wire through right groin 02-12-14 by Dr Gwenlyn Found 2.  Implantation of dual chamber pacemaker on 02-14-14 by Dr Caryl Comes. The patient received a MDT Advisa dual chamber pacemaker with model number [id number] right atrial and right ventricular leads.  There were no early apparent complications. This is a MRI compatible system. 3.  CXR on 02-15-14 demonstrated no ptx status post device implant.   Brief HPI/Hospital Course:  Heather [name] is a 73 y.o. female with a past medical history significant for hypertension, diabetes, sleep apnea, and Chron's disease. She recently moved here from [place]. She was admitted on 7-13 for bradycardia. At that time, her digoxin was discontinued and her heart rates improved. She was discharged to home on Coreg. She reports increasing fatigue, gait instability, and dizziness for the last several days. She was [facility] to [facility] for [facility] evaluation and was found to be in sinus bradycardia. Her Coreg has been held (last dose 7-27 am). She was also in acute renal failure. A temporary pacing wire was placed for backup brady pacing. She has midsternal chest pressure that resolves with higher pacing rates. Echocardiogram 01-30-14 demonstrated EF 62-83%, grade 2 diastolic dysfunction, moderate AS, mild MR, LA 57, moderate pulmonary hypertension. She was evaluated by Dr Rayann Heman who recommended pacemaker  implantation for symptomatic bradycardia.  The patient underwent implantation of a MDT dual chamber pacemaker with details as outlined above.   She was monitored on telemetry overnight which demonstrated atrial pacing with intrinsic ventricular conduction.  Left chest was without hematoma or ecchymosis.  The device was interrogated and found to be functioning normally.  CXR was obtained and demonstrated no pneumothorax status post device implantation.  Wound care, arm mobility, and restrictions were reviewed with the patient.  Dr Caryl Comes examined the patient and considered them stable for discharge to home. She will have a BMET at wound check to recheck renal function.    Discharge Vitals: Blood pressure 135/88, pulse 76, temperature 98.9 F (37.2 C), temperature source Oral, resp. rate 18, height 5' 2"  (1.575 m), weight 163 lb 2.3 oz (74 kg), SpO2 96.00%.   Labs:   Lab Results  Component Value Date   WBC 10.8* 02/14/2014   HGB 14.9 02/14/2014   HCT 46.9* 02/14/2014   MCV 89.8 02/14/2014   PLT 290 02/14/2014    Recent Labs Lab 02/13/14 0130  02/15/14 0531  NA 141  < > 140  K 5.3  < > 4.9  CL 105  < > 104  CO2 20  < > 20  BUN 54*  < > 45*  CREATININE 1.83*  < > 1.79*  CALCIUM 9.2  < > 9.2  PROT 7.9  --   --   BILITOT 0.4  --   --   ALKPHOS 142*  --   --   ALT 40*  --   --   AST 28  --   --   GLUCOSE 88  < > 91  < > = values  in this interval not displayed. Lab Results  Component Value Date   TROPONINI <0.30 02/13/2014      Discharge Medications:    Medication List    STOP taking these medications       NIFEdipine 30 MG 24 hr tablet  Commonly known as:  PROCARDIA-XL/ADALAT CC     spironolactone 25 MG tablet  Commonly known as:  ALDACTONE      TAKE these medications       aspirin 81 MG EC tablet  Take 1 tablet (81 mg total) by mouth daily.     carvedilol 6.25 MG tablet  Commonly known as:  COREG  Take 1 tablet (6.25 mg total) by mouth 2 (two) times daily with a meal.       furosemide 40 MG tablet  Commonly known as:  LASIX  Take 1 tablet (40 mg total) by mouth daily.     losartan 50 MG tablet  Commonly known as:  COZAAR  Take 1 tablet (50 mg total) by mouth daily.     metFORMIN 850 MG tablet  Commonly known as:  GLUCOPHAGE  Take 1 tablet (850 mg total) by mouth daily.     omeprazole 20 MG capsule  Commonly known as:  PRILOSEC  Take 20 mg by mouth 2 (two) times daily.        Disposition:       Follow-up Information   Follow up with [facility] On 02/26/2014. (4:30PM)    Contact information:   [address] [place] [address] [phone]      Follow up with Darlin Coco, MD On 03/12/2014. (9:45 AM)    Specialty:  Cardiology   Contact information:   [address] [place] [address] [phone]       Follow up with Virl Axe, MD On 05/22/2014. (3:15 PM)    Specialty:  Cardiology   Contact information:   [address] [address] [phone]       Duration of Discharge Encounter: Greater than 30 minutes including physician time.  Signed,

## 2014-02-15 NOTE — Discharge Instructions (Signed)
**  PLEASE REMEMBER TO BRING ALL OF YOUR MEDICATIONS TO EACH OF YOUR FOLLOW-UP OFFICE VISITS.     Supplemental Discharge Instructions for  Pacemaker/Defibrillator Patients  Activity No heavy lifting or vigorous activity with your left/right arm for 6 to 8 weeks.  Do not raise your left/right arm above your head for one week.  Gradually raise your affected arm as drawn below.           __        8/4                            8/5                            8/6                          8/7                 NO DRIVING for     ; you may begin driving on  8/7 .  WOUND CARE   Keep the wound area clean and dry.  Do not get this area wet for one week. No showers for one week; you may shower on 8/7  .   The tape/steri-strips on your wound will fall off; do not pull them off.  No bandage is needed on the site.  DO  NOT apply any creams, oils, or ointments to the wound area.   If you notice any drainage or discharge from the wound, any swelling or bruising at the site, or you develop a fever > 101? F after you are discharged home, call the office at once.  Special Instructions   You are still able to use cellular telephones; use the ear opposite the side where you have your pacemaker/defibrillator.  Avoid carrying your cellular phone near your device.   When traveling through airports, show security personnel your identification card to avoid being screened in the metal detectors.  Ask the security personnel to use the hand wand.   Avoid arc welding equipment, MRI testing (magnetic resonance imaging), TENS units (transcutaneous nerve stimulators).  Call the office for questions about other devices.   Avoid electrical appliances that are in poor condition or are not properly grounded.   Microwave ovens are safe to be near or to operate.

## 2014-02-15 NOTE — Progress Notes (Signed)
Duplicate

## 2014-02-15 NOTE — ED Notes (Signed)
Patient transported to X-ray 

## 2014-02-15 NOTE — ED Notes (Signed)
Per EMS: pt discharged from hospital this morning after getting a pacemaker placed 2 days ago.  Pt reporting fever, weakness and chills since leaving hospital.  Temp 101.3 on arrival.  No pain reported.

## 2014-02-16 ENCOUNTER — Encounter (HOSPITAL_COMMUNITY): Payer: Self-pay | Admitting: Internal Medicine

## 2014-02-16 DIAGNOSIS — K5090 Crohn's disease, unspecified, without complications: Secondary | ICD-10-CM | POA: Insufficient documentation

## 2014-02-16 DIAGNOSIS — Z950 Presence of cardiac pacemaker: Secondary | ICD-10-CM

## 2014-02-16 DIAGNOSIS — N289 Disorder of kidney and ureter, unspecified: Secondary | ICD-10-CM

## 2014-02-16 DIAGNOSIS — R509 Fever, unspecified: Secondary | ICD-10-CM | POA: Diagnosis not present

## 2014-02-16 DIAGNOSIS — A419 Sepsis, unspecified organism: Principal | ICD-10-CM

## 2014-02-16 DIAGNOSIS — I509 Heart failure, unspecified: Secondary | ICD-10-CM

## 2014-02-16 DIAGNOSIS — I809 Phlebitis and thrombophlebitis of unspecified site: Secondary | ICD-10-CM

## 2014-02-16 DIAGNOSIS — I5032 Chronic diastolic (congestive) heart failure: Secondary | ICD-10-CM | POA: Diagnosis not present

## 2014-02-16 DIAGNOSIS — I2789 Other specified pulmonary heart diseases: Secondary | ICD-10-CM | POA: Diagnosis not present

## 2014-02-16 HISTORY — DX: Presence of cardiac pacemaker: Z95.0

## 2014-02-16 LAB — BASIC METABOLIC PANEL
Anion gap: 16 — ABNORMAL HIGH (ref 5–15)
BUN: 48 mg/dL — ABNORMAL HIGH (ref 6–23)
CO2: 15 mEq/L — ABNORMAL LOW (ref 19–32)
Calcium: 9.2 mg/dL (ref 8.4–10.5)
Chloride: 104 mEq/L (ref 96–112)
Creatinine, Ser: 1.77 mg/dL — ABNORMAL HIGH (ref 0.50–1.10)
GFR calc Af Amer: 31 mL/min — ABNORMAL LOW (ref >90)
GFR calc non Af Amer: 27 mL/min — ABNORMAL LOW (ref >90)
Glucose, Bld: 124 mg/dL — ABNORMAL HIGH (ref 70–99)
Potassium: 5.4 mEq/L — ABNORMAL HIGH (ref 3.7–5.3)
Sodium: 135 mEq/L — ABNORMAL LOW (ref 137–147)

## 2014-02-16 LAB — CBC WITH DIFFERENTIAL/PLATELET
Basophils Absolute: 0.1 10*3/uL (ref 0.0–0.1)
Basophils Relative: 0 % (ref 0–1)
Eosinophils Absolute: 0 10*3/uL (ref 0.0–0.7)
Eosinophils Relative: 0 % (ref 0–5)
HCT: 44.6 % (ref 36.0–46.0)
Hemoglobin: 14.6 g/dL (ref 12.0–15.0)
Lymphocytes Relative: 6 % — ABNORMAL LOW (ref 12–46)
Lymphs Abs: 1.3 10*3/uL (ref 0.7–4.0)
MCH: 29.9 pg (ref 26.0–34.0)
MCHC: 32.7 g/dL (ref 30.0–36.0)
MCV: 91.2 fL (ref 78.0–100.0)
Monocytes Absolute: 1.2 10*3/uL — ABNORMAL HIGH (ref 0.1–1.0)
Monocytes Relative: 6 % (ref 3–12)
Neutro Abs: 17.5 10*3/uL — ABNORMAL HIGH (ref 1.7–7.7)
Neutrophils Relative %: 88 % — ABNORMAL HIGH (ref 43–77)
Platelets: 230 10*3/uL (ref 150–400)
RBC: 4.89 MIL/uL (ref 3.87–5.11)
RDW: 14.4 % (ref 11.5–15.5)
WBC: 20 10*3/uL — ABNORMAL HIGH (ref 4.0–10.5)

## 2014-02-16 LAB — URINALYSIS, ROUTINE W REFLEX MICROSCOPIC
Bilirubin Urine: NEGATIVE
Glucose, UA: NEGATIVE mg/dL
Hgb urine dipstick: NEGATIVE
Ketones, ur: NEGATIVE mg/dL
Leukocytes, UA: NEGATIVE
Nitrite: NEGATIVE
Protein, ur: NEGATIVE mg/dL
Specific Gravity, Urine: 1.013 (ref 1.005–1.030)
Urobilinogen, UA: 1 mg/dL (ref 0.0–1.0)
pH: 5 (ref 5.0–8.0)

## 2014-02-16 LAB — GLUCOSE, CAPILLARY
Glucose-Capillary: 114 mg/dL — ABNORMAL HIGH (ref 70–99)
Glucose-Capillary: 95 mg/dL (ref 70–99)
Glucose-Capillary: 95 mg/dL (ref 70–99)
Glucose-Capillary: 120 mg/dL — ABNORMAL HIGH (ref 70–99)
Glucose-Capillary: 96 mg/dL (ref 70–99)
Glucose-Capillary: 113 mg/dL — ABNORMAL HIGH (ref 70–99)

## 2014-02-16 MED ORDER — CEFEPIME HCL 2 G IJ SOLR: 2.0000 g | Freq: Two times a day (BID) | INTRAMUSCULAR | Status: DC

## 2014-02-16 MED ORDER — SODIUM CHLORIDE 0.9 % IJ SOLN: 3.0000 mL | INTRAMUSCULAR | Status: DC | PRN

## 2014-02-16 MED ORDER — CARVEDILOL 6.25 MG PO TABS
6.2500 mg | ORAL_TABLET | Freq: Two times a day (BID) | ORAL | Status: DC
  Administered 2014-02-16 – 2014-02-20 (×9): 6.25 mg via ORAL
  Filled 2014-02-16 (×12): qty 1

## 2014-02-16 MED ORDER — GLUCERNA SHAKE PO LIQD
237.0000 mL | ORAL | Status: DC
  Administered 2014-02-16 – 2014-02-19 (×4): 237 mL via ORAL

## 2014-02-16 MED ORDER — SODIUM CHLORIDE 0.9 % IJ SOLN
3.0000 mL | Freq: Two times a day (BID) | INTRAMUSCULAR | Status: DC
  Administered 2014-02-17 – 2014-02-18 (×2): 3 mL via INTRAVENOUS

## 2014-02-16 MED ORDER — FUROSEMIDE 40 MG PO TABS
40.0000 mg | ORAL_TABLET | Freq: Every day | ORAL | Status: DC
  Administered 2014-02-16 – 2014-02-20 (×5): 40 mg via ORAL
  Filled 2014-02-16 (×5): qty 1

## 2014-02-16 MED ORDER — VANCOMYCIN HCL IN DEXTROSE 750-5 MG/150ML-% IV SOLN
750.0000 mg | INTRAVENOUS | Status: DC
  Administered 2014-02-17 (×2): 750 mg via INTRAVENOUS
  Filled 2014-02-16 (×2): qty 150

## 2014-02-16 MED ORDER — ACETAMINOPHEN 650 MG RE SUPP: 650.0000 mg | Freq: Four times a day (QID) | RECTAL | Status: DC | PRN

## 2014-02-16 MED ORDER — DEXTROSE 5 % IV SOLN
1.0000 g | INTRAVENOUS | Status: DC
  Administered 2014-02-16 – 2014-02-18 (×3): 1 g via INTRAVENOUS
  Filled 2014-02-16 (×3): qty 1

## 2014-02-16 MED ORDER — LOSARTAN POTASSIUM 50 MG PO TABS
50.0000 mg | ORAL_TABLET | Freq: Every day | ORAL | Status: DC
  Filled 2014-02-16: qty 1

## 2014-02-16 MED ORDER — SODIUM CHLORIDE 0.9 % IV SOLN: 250.0000 mL | INTRAVENOUS | Status: DC | PRN

## 2014-02-16 MED ORDER — ACETAMINOPHEN 325 MG PO TABS: 650.0000 mg | ORAL_TABLET | Freq: Four times a day (QID) | ORAL | Status: DC | PRN

## 2014-02-16 MED ORDER — PIPERACILLIN-TAZOBACTAM 3.375 G IVPB
3.3750 g | Freq: Three times a day (TID) | INTRAVENOUS | Status: DC
  Filled 2014-02-16 (×2): qty 50

## 2014-02-16 MED ORDER — ADULT MULTIVITAMIN W/MINERALS CH
1.0000 | ORAL_TABLET | Freq: Every day | ORAL | Status: DC
  Administered 2014-02-16 – 2014-02-20 (×5): 1 via ORAL
  Filled 2014-02-16 (×5): qty 1

## 2014-02-16 MED ORDER — INSULIN ASPART 100 UNIT/ML ~~LOC~~ SOLN
0.0000 [IU] | Freq: Three times a day (TID) | SUBCUTANEOUS | Status: DC
  Administered 2014-02-17: 2 [IU] via SUBCUTANEOUS
  Administered 2014-02-17 – 2014-02-19 (×3): 1 [IU] via SUBCUTANEOUS

## 2014-02-16 MED ORDER — SODIUM CHLORIDE 0.9 % IJ SOLN
3.0000 mL | Freq: Two times a day (BID) | INTRAMUSCULAR | Status: DC
  Administered 2014-02-16 – 2014-02-19 (×6): 3 mL via INTRAVENOUS

## 2014-02-16 NOTE — Progress Notes (Signed)
RT applied CPAP w/nasal mask for patient on auto with a maximum of 20 cmH2O and minimum of 4 cmH20.  Patient is tolerating at this time.  RT will continue to monitor.

## 2014-02-16 NOTE — Consult Note (Signed)
[place] [facility] for Infectious Disease    Date of Admission:  02/15/2014           Day 1 vancomycin       Reason for Consult: Fever    Referring Physician: Dr. Jolyn Nap  Principal Problem:   Fever Active Problems:   Pacemaker-Medtronic   Sinus bradycardia   DM (diabetes mellitus)   OSA on CPAP   Pulmonary hypertension   CAD (coronary artery disease)   Hypertension   Diastolic CHF   Crohn's disease   . carvedilol  6.25 mg Oral BID WC  . furosemide  40 mg Oral Daily  . insulin aspart  0-9 Units Subcutaneous TID WC  . piperacillin-tazobactam (ZOSYN)  IV  3.375 g Intravenous Q8H  . sodium chloride  3 mL Intravenous Q12H  . sodium chloride  3 mL Intravenous Q12H    Recommendations: 1. Vancomycin and cefepime pending further observation and blood culture results   Assessment: Not entirely certain what is causing her fever but it certainly could be the phlebitis in her right arm. Obviously it could be related to her recent right groin temporary pacer or her new permanent pacemaker although infection at those sites is not evident. With her phlebitis she is at risk for bacteremia so I will cover her with continued vancomycin and cefepime pending further observation and blood cultures.    HPI: Heather [name] is a 73 y.o. female native of [place] who immigrated here 10 months ago. She has diastolic heart failure and sinus node dysfunction with bradycardia. She also has aortic stenosis. She was hospitalized from July of 13-16 with digoxin toxicity. She was rehospitalized on July 27 with bradycardia and fatigue. She had a temporary pacemaker inserted through her right groin then had a permanent pacemaker placed on July 29. She was discharged yesterday he but readmitted last night after developing temperature to 101.3 and a white count of 20,000. She feels fatigued and. She denies having any chills, sweats, headache, new cough or shortness of breath, nausea, vomiting,  diarrhea and dysuria. She has not noted any increased swelling, redness or tenderness over the left anterior chest pacemaker site.   Review of Systems: Pertinent items are noted in HPI.  Past Medical History  Diagnosis Date  . CHF (congestive heart failure)   . Hypertension   . Murmur, cardiac   . Pulmonary hypertension   . Crohn's disease   . 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 Sinus bradycardia 01/29/2014  . High cholesterol   . OSA on CPAP   . Type II diabetes mellitus   . GERD (gastroesophageal reflux disease)   . Migraine     "1-2 times/yr" (02/16/2014)  . Chronic back pain   . Skin cancer 2003    "3 cut off face" (02/16/2014)    History  Substance Use Topics  . Smoking status: Never Smoker   . Smokeless tobacco: Never Used  . Alcohol Use: No    Family History  Problem Relation Age of Onset  . Adopted: Yes   No Known Allergies  OBJECTIVE: Blood pressure 113/43, pulse 70, temperature 98 F (36.7 C), temperature source Oral, resp. rate 18, height 5' 2"  (1.575 m), weight 72.213 kg (159 lb 3.2 oz), SpO2 98.00%. General: She is laying in bed in no distress. Her daughter is present and interprets for her. Skin: No acute rash. Small area of venous phlebitis in right forearm a at the site of a previous IV Lungs:  Clear Cor: Regular S1 and S2 with a 2/6 systolic murmur heard best at the left upper sternal border Chest: Her pacemaker pocket in the left anterior chest appears as expected. There is no unusual erythema fluctuance. There is no wound drainage Abdomen: Soft and nontender. The insertion site for the temporary pacemaker and right groin appears normal. There is some tenderness with palpation Joints and extremities: No acute abnormalities  Lab Results Lab Results  Component Value Date   WBC 20.0* 02/16/2014   HGB 14.6 02/16/2014   HCT 44.6 02/16/2014   MCV 91.2 02/16/2014   PLT 230 02/16/2014    Lab Results  Component Value Date   CREATININE 1.77* 02/16/2014   BUN  48* 02/16/2014   NA 135* 02/16/2014   K 5.4* 02/16/2014   CL 104 02/16/2014   CO2 15* 02/16/2014    Lab Results  Component Value Date   ALT 40* 02/13/2014   AST 28 02/13/2014   ALKPHOS 142* 02/13/2014   BILITOT 0.4 02/13/2014     Microbiology: Recent Results (from the past 240 hour(s))  MRSA PCR SCREENING     Status: None   Collection Time    02/12/14  7:53 PM      Result Value Ref Range Status   MRSA by PCR NEGATIVE  NEGATIVE Final   Comment:            The GeneXpert MRSA Assay (FDA     approved for NASAL specimens     only), is one component of a     comprehensive MRSA colonization     surveillance program. It is not     intended to diagnose MRSA     infection nor to guide or     monitor treatment for     MRSA infections.    Michel Bickers, MD [facility] for [facility] [phone] pager   [phone] cell 02/16/2014, 9:48 AM

## 2014-02-16 NOTE — Progress Notes (Signed)
ANTIBIOTIC CONSULT NOTE - INITIAL  Pharmacy Consult for Vancomycin Indication: Fever of unknown etiology  No Known Allergies  Patient Measurements: Height: 5' 2"  (157.5 cm) Weight: 159 lb 3.2 oz (72.213 kg) (Scale C) IBW/kg (Calculated) : 50.1  Vital Signs: Temp: 98 F (36.7 C) (07/31 0928) Temp src: Oral (07/31 0928) BP: 113/43 mmHg (07/31 0928) Pulse Rate: 70 (07/31 0928) Intake/Output from previous day: 07/30 0701 - 07/31 0700 In: -  Out: 200 [Urine:200] Intake/Output from this shift: Total I/O In: 240 [P.O.:240] Out: 250 [Urine:250]  Labs:  Recent Labs  02/14/14 0306 02/15/14 0531 02/16/14 0029  WBC 10.8*  --  20.0*  HGB 14.9  --  14.6  PLT 290  --  230  CREATININE 1.77* 1.79* 1.77*   Estimated Creatinine Clearance: 25.9 ml/min (by C-G formula based on Cr of 1.77). No results found for this basename: VANCOTROUGH, Corlis Leak, VANCORANDOM, [place] Neck, GENTPEAK, GENTRANDOM, TOBRATROUGH, TOBRAPEAK, TOBRARND, AMIKACINPEAK, AMIKACINTROU, AMIKACIN,  in the last 72 hours   Microbiology: Recent Results (from the past 720 hour(s))  MRSA PCR SCREENING     Status: None   Collection Time    02/12/14  7:53 PM      Result Value Ref Range Status   MRSA by PCR NEGATIVE  NEGATIVE Final   Comment:            The GeneXpert MRSA Assay (FDA     approved for NASAL specimens     only), is one component of a     comprehensive MRSA colonization     surveillance program. It is not     intended to diagnose MRSA     infection nor to guide or     monitor treatment for     MRSA infections.    Medical History: Past Medical History  Diagnosis Date  . CHF (congestive heart failure)   . Hypertension   . Murmur, cardiac   . Pulmonary hypertension   . Crohn's disease   . 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 Sinus bradycardia 01/29/2014  . High cholesterol   . OSA on CPAP   . Type II diabetes mellitus   . GERD (gastroesophageal reflux disease)   . Migraine     "1-2 times/yr"  (02/16/2014)  . Chronic back pain   . Skin cancer 2003    "3 cut off face" (02/16/2014)    Medications:  Scheduled:  . carvedilol  6.25 mg Oral BID WC  . ceFEPime (MAXIPIME) IV  1 g Intravenous Q24H  . furosemide  40 mg Oral Daily  . insulin aspart  0-9 Units Subcutaneous TID WC  . sodium chloride  3 mL Intravenous Q12H  . sodium chloride  3 mL Intravenous Q12H   Assessment: 66 yoF S/P pacemaker placement 2 days ago presents with a fever of unknown etiology over 102 with concerns that her pacemaker site may have become infected. She received one dose of vancomycin in ED.  ID recommended continuing vancomycin and starting cefepime. Her CrCl is ~25.  She was initiated on Cefepime  and will begin Vancomycin 750 mg IV Q 24 hours.  Goal of Therapy:  Vancomycin trough level 15-20 mcg/ml  Plan:  1) Start Vancomycin 750 mg IV Q 24 hours  2) Change to Cefepime 1 g IV Q 24 hours for renal dose adjustment 3) Will check vanc trough when at steady state 4) F/U Blood cultures  Theron Arista, PharmD Clinical Pharmacist - Resident Pager: [phone] 7/31/201510:29 AM

## 2014-02-16 NOTE — H&P (Signed)
Chief Complaint:  fever  HPI: 73 yo female s/p pacemaker placement 2 days ago, comes in with fever over 102.  No cough.  No n/v/d.  No rashes.  No abd pain, no cp.  No pain around surgical site unless palpate it.  No drainage from site.  No dysuria.  No uri symptoms or no sick contacts.  ua and cxr are clean.  Concern was that her pacemaker site is getting infected.  Cardiology has been called and will see in am.  One dose of vancomycin has been given.  Review of Systems:  Positive and negative as per HPI otherwise all other systems are negative  Past Medical History: Past Medical History  Diagnosis Date  . CHF (congestive heart failure)   . Hypertension   . Diabetes mellitus without complication   . Murmur, cardiac   . Pulmonary hypertension   . OSA on CPAP   . Crohn's disease    Past Surgical History  Procedure Laterality Date  . None      Medications: Prior to Admission medications   Medication Sig Start Date End Date Taking? Authorizing Provider  aspirin EC 81 MG EC tablet Take 1 tablet (81 mg total) by mouth daily. 02/01/14  Yes Charlynne Cousins, MD  carvedilol (COREG) 6.25 MG tablet Take 1 tablet (6.25 mg total) by mouth 2 (two) times daily with a meal. 02/01/14  Yes Charlynne Cousins, MD  furosemide (LASIX) 40 MG tablet Take 1 tablet (40 mg total) by mouth daily. 02/01/14  Yes Charlynne Cousins, MD  losartan (COZAAR) 50 MG tablet Take 1 tablet (50 mg total) by mouth daily. 02/01/14  Yes Charlynne Cousins, MD  metFORMIN (GLUCOPHAGE) 850 MG tablet Take 1 tablet (850 mg total) by mouth daily. 02/01/14  Yes Charlynne Cousins, MD  omeprazole (PRILOSEC) 20 MG capsule Take 20 mg by mouth 2 (two) times daily.    Yes Historical Provider, MD    Allergies:  No Known Allergies  Social History:  reports that she has never smoked. She does not have any smokeless tobacco history on file. She reports that she does not drink alcohol or use illicit drugs.  Family  History: Family History  Problem Relation Age of Onset  . Adopted: Yes    Physical Exam: Filed Vitals:   02/16/14 0100 02/16/14 0130 02/16/14 0145 02/16/14 0215  BP: 117/47 116/42 118/57 119/49  Pulse: 68 75 69 70  Temp:      TempSrc:      Resp: 16 16 18 16   SpO2: 93% 91% 95% 94%   General appearance: alert, cooperative and no distress Head: Normocephalic, without obvious abnormality, atraumatic Eyes: negative Nose: Nares normal. Septum midline. Mucosa normal. No drainage or sinus tenderness. Neck: no JVD and supple, symmetrical, trachea midline Lungs: clear to auscultation bilaterally Heart: regular rate and rhythm, S1, S2 normal, no murmur, click, rub or gallop Abdomen: soft, non-tender; bowel sounds normal; no masses,  no organomegaly Extremities: extremities normal, atraumatic, no cyanosis or edema Pulses: 2+ and symmetric Skin: Skin color, texture, turgor normal. No rashes or lesions pacemaker site c/d/i  Neurologic: Grossly normal   Labs on Admission:   Recent Labs  02/15/14 0531 02/16/14 0029  NA 140 135*  K 4.9 5.4*  CL 104 104  CO2 20 15*  GLUCOSE 91 124*  BUN 45* 48*  CREATININE 1.79* 1.77*  CALCIUM 9.2 9.2    Recent Labs  02/14/14 0306 02/16/14 0029  WBC 10.8* 20.0*  NEUTROABS  --  17.5*  HGB 14.9 14.6  HCT 46.9* 44.6  MCV 89.8 91.2  PLT 290 230    Recent Labs  02/13/14 0720  TROPONINI <0.30    Radiological Exams on Admission: Dg Chest 2 View  02/16/2014   CLINICAL DATA:  Weakness.  History of Crohn's disease.  EXAM: CHEST  2 VIEW  COMPARISON:  01/19/2014  FINDINGS: Cardiac pacemaker is unchanged. The heart size and mediastinal contours are within normal limits. Both lungs are clear. The visualized skeletal structures are unremarkable.  IMPRESSION: No active cardiopulmonary disease.   Electronically Signed   By: Lucienne Capers M.D.   On: 02/16/2014 00:08   Dg Chest 2 View  02/15/2014   CLINICAL DATA:  Pacemaker placement.  EXAM: CHEST   2 VIEW  COMPARISON:  One-view chest 02/12/2014.  FINDINGS: The heart is mildly enlarged. There is no edema or effusion to suggest failure. Minimal bibasilar atelectasis is evident. A dual-chamber pacemaker is in place via a left subclavian approach. The leads are in the right atrium and right ventricle. Mild curvature is again noted within the upper lumbar spine. The visualized soft tissues and bony thorax are otherwise unremarkable.  IMPRESSION: 1. Interval placement of dual lead pacemaker without radiographic evidence for complication. 2. Minimal bibasilar atelectasis. 3. Borderline cardiomegaly without failure.   Electronically Signed   By: Lawrence Santiago M.D.   On: 02/15/2014 07:48   Assessment/Plan 73 yo female with recent pacemaker placement who now has fever of uncertain etiology  Principal Problem:   Fever w leukocytosis-  Pt has been given dose of vancomycin.  ua and cxr are clear.  ???incision site, cardiology has been called and will see in am.  Vss.    Active Problems:  Stable unless o/w noted   OSA on CPAP   Pulmonary hypertension   Diastolic CHF   S/P placement of cardiac pacemaker    Heather [name] A 02/16/2014, 3:16 AM

## 2014-02-16 NOTE — Progress Notes (Signed)
The patient arrived to room 3E16 from the ED at 0410.  She is A&Ox4 and her VS are stable.  She is V-Paced on the monitor.  She is Spanish-speaking only and her daughter is at the bedside.  She is a standby assist when ambulating.  The call bell and phone were placed within reach.

## 2014-02-16 NOTE — Progress Notes (Signed)
UR completeCrystal K. Safiyya Stokes, RN, BSN, MSHL, CCM  02/16/2014 4:02 PMd

## 2014-02-16 NOTE — Consult Note (Signed)
ELECTROPHYSIOLOGY CONSULT NOTE  Patient ID: Heather [name], MRN: [id number], DOB/AGE: [birth year] 73 y.o. Admit date: 02/15/2014 Date of Consult: 02/16/2014  Primary Physician: No PCP Per Patient Primary Cardiologist: SK/TB  Chief Complaint: fever   HPI Heather [name] is a 73 y.o. female  Discharged yesterday morning and readmitted yesterday evening after she developed a fever. It was recorded at 101.5. White count had gone from 12,000-20,000.  She denies dysuria. There is no cough.  Evaluation included a normal chest x-ray. UA was negative. Blood cultures were sent and are pending.    Past Medical History  Diagnosis Date  . CHF (congestive heart failure)   . Hypertension   . Diabetes mellitus without complication   . Murmur, cardiac   . Pulmonary hypertension   . OSA on CPAP   . Crohn's disease   . 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 Sinus bradycardia 01/29/2014      Surgical History:  Past Surgical History  Procedure Laterality Date  . None       Home Meds: Prior to Admission medications   Medication Sig Start Date End Date Taking? Authorizing Provider  aspirin EC 81 MG EC tablet Take 1 tablet (81 mg total) by mouth daily. 02/01/14  Yes Charlynne Cousins, MD  carvedilol (COREG) 6.25 MG tablet Take 1 tablet (6.25 mg total) by mouth 2 (two) times daily with a meal. 02/01/14  Yes Charlynne Cousins, MD  furosemide (LASIX) 40 MG tablet Take 1 tablet (40 mg total) by mouth daily. 02/01/14  Yes Charlynne Cousins, MD  losartan (COZAAR) 50 MG tablet Take 1 tablet (50 mg total) by mouth daily. 02/01/14  Yes Charlynne Cousins, MD  metFORMIN (GLUCOPHAGE) 850 MG tablet Take 1 tablet (850 mg total) by mouth daily. 02/01/14  Yes Charlynne Cousins, MD  omeprazole (PRILOSEC) 20 MG capsule Take 20 mg by mouth 2 (two) times daily.    Yes Historical Provider, MD    Inpatient Medications:  . carvedilol  6.25 mg Oral BID WC  . furosemide  40 mg Oral Daily  . insulin aspart  0-9 Units  Subcutaneous TID WC  . losartan  50 mg Oral Daily  . sodium chloride  3 mL Intravenous Q12H  . sodium chloride  3 mL Intravenous Q12H     Allergies: No Known Allergies  History   Social History  . Marital Status: Widowed    Spouse Name: N/A    Number of Children: N/A  . Years of Education: N/A   Occupational History  . Not on file.   Social History Main Topics  . Smoking status: Never Smoker   . Smokeless tobacco: Not on file  . Alcohol Use: No  . Drug Use: No  . Sexual Activity: Not on file   Other Topics Concern  . Not on file   Social History Narrative   Moved here from [place] in October 2014.     Family History  Problem Relation Age of Onset  . Adopted: Yes     ROS:  Please see the history of present illness.     All other systems reviewed and negative.    Physical Exam:   Blood pressure 128/52, pulse 72, temperature 98.6 F (37 C), temperature source Oral, resp. rate 18, height 5' 2"  (1.575 m), weight 159 lb 3.2 oz (72.213 kg), SpO2 100.00%. General: Well developed, well nourished female in no acute distress. Head: Normocephalic, atraumatic, sclera non-icteric, no xanthomas, nares are without discharge. EENT: normal Lymph  Nodes:  none Back: without scoliosis/kyphosis, no CVA tendersness Neck: Negative for carotid bruits. JVD not elevated. Lungs: Clear bilaterally to auscultation without wheezes, rales, or rhonchi. Breathing is unlabored. Pacemaker pocket without tenderness or hematoma Heart: RRR with S1 S2. No   murmur , rubs, or gallops appreciated. Abdomen: Soft, non-tender, non-distended with normoactive bowel sounds. No hepatomegaly. No rebound/guarding. No obvious abdominal masses. Msk:  Strength and tone appear normal for age. Extremities: No clubbing or cyanosis. No  edema.  Distal pedal pulses are 2+ and equal bilaterally. Tender phlebitic site right antecubital Skin: Warm and Dry Neuro: Alert and oriented X 3. CN III-XII intact Grossly normal  sensory and motor function . Psych:  Responds to questions appropriately with a normal affect.      Labs: Cardiac Enzymes No results found for this basename: CKTOTAL, CKMB, TROPONINI,  in the last 72 hours CBC Lab Results  Component Value Date   WBC 20.0* 02/16/2014   HGB 14.6 02/16/2014   HCT 44.6 02/16/2014   MCV 91.2 02/16/2014   PLT 230 02/16/2014   PROTIME: No results found for this basename: LABPROT, INR,  in the last 72 hours Chemistry  Recent Labs Lab 02/13/14 0130  02/16/14 0029  NA 141  < > 135*  K 5.3  < > 5.4*  CL 105  < > 104  CO2 20  < > 15*  BUN 54*  < > 48*  CREATININE 1.83*  < > 1.77*  CALCIUM 9.2  < > 9.2  PROT 7.9  --   --   BILITOT 0.4  --   --   ALKPHOS 142*  --   --   ALT 40*  --   --   AST 28  --   --   GLUCOSE 88  < > 124*  < > = values in this interval not displayed. Lipids No results found for this basename: CHOL,  HDL,  LDLCALC,  TRIG   BNP Pro B Natriuretic peptide (BNP)  Date/Time Value Ref Range Status  02/12/2014  4:04 PM 1430.0* 0 - 125 pg/mL Final  01/29/2014  8:30 PM 1482.0* 0 - 125 pg/mL Final   Miscellaneous No results found for this basename: DDIMER    Radiology/Studies:  Dg Chest 2 View  02/16/2014   CLINICAL DATA:  Weakness.  History of Crohn's disease.  EXAM: CHEST  2 VIEW  COMPARISON:  01/19/2014  FINDINGS: Cardiac pacemaker is unchanged. The heart size and mediastinal contours are within normal limits. Both lungs are clear. The visualized skeletal structures are unremarkable.  IMPRESSION: No active cardiopulmonary disease.   Electronically Signed   By: Lucienne Capers M.D.   On: 02/16/2014 00:08   Dg Chest 2 View  02/15/2014   CLINICAL DATA:  Pacemaker placement.  EXAM: CHEST  2 VIEW  COMPARISON:  One-view chest 02/12/2014.  FINDINGS: The heart is mildly enlarged. There is no edema or effusion to suggest failure. Minimal bibasilar atelectasis is evident. A dual-chamber pacemaker is in place via a left subclavian approach. The  leads are in the right atrium and right ventricle. Mild curvature is again noted within the upper lumbar spine. The visualized soft tissues and bony thorax are otherwise unremarkable.  IMPRESSION: 1. Interval placement of dual lead pacemaker without radiographic evidence for complication. 2. Minimal bibasilar atelectasis. 3. Borderline cardiomegaly without failure.   Electronically Signed   By: Lawrence Santiago M.D.   On: 02/15/2014 07:48   Dg Chest 2 View  01/31/2014  CLINICAL DATA:  Shortness of breath  EXAM: CHEST  2 VIEW  COMPARISON:  01/29/2014  FINDINGS: Mild cardiac enlargement is stable. Vascular pattern is normal. No consolidation or effusion.  IMPRESSION: No acute abnormalities.   Electronically Signed   By: Skipper Cliche M.D.   On: 01/31/2014 22:14   US Renal  01/30/2014   CLINICAL DATA:  Elevated creatinine, evaluate for kidney disease. History of diabetes and hypertension.  EXAM: RENAL/URINARY TRACT ULTRASOUND COMPLETE  COMPARISON:  None.  FINDINGS: Right Kidney:  Length: 10.8 cm. Echogenicity within normal limits. No mass or hydronephrosis visualized.  Left Kidney:  Length: 10.3 cm. Echogenicity within normal limits. No hydronephrosis visualized. 2 cm cyst over the upper pole.  Bladder:  Appears normal for degree of bladder distention.  IMPRESSION: Normal size kidneys without evidence of hydronephrosis.  2 cm left upper pole renal cyst.   Electronically Signed   By: Marin Olp M.D.   On: 01/30/2014 09:41   Dg Chest Port 1 View  02/12/2014   CLINICAL DATA:  Dizziness and abdominal pain. Chest pain. Headache.  EXAM: PORTABLE CHEST - 1 VIEW  COMPARISON:  Two-view chest 01/31/2014  FINDINGS: The heart size is mildly enlarged. There is no edema or effusion to suggest failure. Costophrenic angles are sharp. Defibrillator pads are in place. No focal airspace disease is present. The visualized soft tissues and bony thorax are unremarkable.  IMPRESSION: 1. Mild cardiomegaly without failure. 2. No  acute cardiopulmonary disease.   Electronically Signed   By: Lawrence Santiago M.D.   On: 02/12/2014 16:33   Dg Chest Portable 1 View  01/29/2014   CLINICAL DATA:  Bradycardia. Chest pain. Hypertension. Heart failure.  EXAM: PORTABLE CHEST - 1 VIEW  COMPARISON:  None.  FINDINGS: Mildly enlarged cardiopericardial silhouette. Atherosclerotic aortic arch.  No overt edema. Slight pleural thickening at the right lung apex, likely benign/incidental.  Degenerative AC joint arthropathy bilaterally.  IMPRESSION: 1. Mildly enlarged cardiopericardial silhouette, without edema. 2. Atherosclerotic aortic arch.   Electronically Signed   By: Sherryl Barters M.D.   On: 01/29/2014 21:35    EKG: *    Assessment and Plan:  Fever/leukocytosis  Pacemaker implant-recent  Transvenous pacemaker-right groin  The patient presented with fever leukocytosis. Physical examination suggests only mild phlebitis in her right elbow to my examination. The concern will be the falls negative blood cultures which were derived because of periprocedural antibiotics.  I've taken the liberty of asking Dr. Megan Salon from ID to weigh in his we will is with the issue of what to do with her device in the absence of a definitive infection   Virl Axe

## 2014-02-16 NOTE — Progress Notes (Signed)
[facility] PROGRESS NOTE  Heather [name] [name] DOB: [birth year] DOA: 02/15/2014 PCP: No PCP Per Patient  Assessment/Plan: 1. Sepsis -Present on admission, evidenced by a white count of 20,000, temperature of 102 at home reported by patient, having temperature of 101.3 overnight, with source of infection possibly coming from recent right groin temporary pacer placement or permanent pacemaker implant. -Infectious disease consulted, was started on empiric broad-spectrum IV antibiotic therapy with cefepime and vancomycin -Blood cultures are pending at the time of this dictation -Continue supportive care, followup on cultures.  2.  Chronic diastolic congestive heart failure -Last transthoracic echocardiogram performed on 01/30/2014 showing ejection fraction of 60-65% without wall motion abnormalities, grade 2 diastolic dysfunction -Clinically compensated -Continue Lasix 40 mg by mouth daily and Coreg 6.25 mg by mouth twice a day  3. 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 Diabetes mellitus -Blood sugars controlled  Code Status: Full Code Family Communication:  Disposition Plan: Continue IV antibiotics, follow up on cultures   Consultants:  Cardiology  Infectious Disease  Antibiotics:  Cefepime  Vancomycin  HPI/Subjective: Patient is a pleasant 73 year old female with a past medical history of diastolic congestive heart failure, sinus node this function with bradycardia, he was receiving mid to the medicine service on 01/29/2014, discharged on 02/01/2014 at which time she was found to be bradycardic, felt to be as a consequence of digoxin toxicity. She was readmitted on 02/12/2014 presenting with generalized weakness, fatigue, found to be bradycardic have a heart rate of 30. During that hospitalization she underwent dual-chamber pacemaker implantation on 02/14/2014. She was discharged from the cardiology service on  02/15/2014. She was readmitted overnight, presenting with complaints of fever, having a temperature 102 at home. Initial labs revealed significant upward trend in her white count from 10,800 on 02/14/2014 to 20,000 on 02/16/2014. Patient was seen and evaluated by infectious disease, started on broad-spectrum empiric IV antibiotic therapy with cefepime and vancomycin. Chest x-ray did not reveal active cardiopulmonary disease, urinalysis was unremarkable. Pacer pocket infection was suspected although there was not obvious clinical evidence of infection.   Objective: Filed Vitals:   02/16/14 1410  BP: 123/49  Pulse: 70  Temp: 98.2 F (36.8 C)  Resp: 16    Intake/Output Summary (Last 24 hours) at 02/16/14 1535 Last data filed at 02/16/14 1435  Gross per 24 hour  Intake    410 ml  Output   1250 ml  Net   -840 ml   Filed Weights   02/16/14 0411  Weight: 72.213 kg (159 lb 3.2 oz)    Exam:   General:  Ill appearing, no acute distress, awake and alert  Cardiovascular: Regular rate and rhythm, normal S1S2  Respiratory: Clear to auscultation bilaterally  Abdomen: Soft nontender nondistended  Musculoskeletal: no edema  Skin: Pacer site with some associated erythema, there was no purulence or fluctuant mass noted.   Data Reviewed: Basic Metabolic Panel:  Recent Labs Lab 02/13/14 0130 02/13/14 1535 02/14/14 0306 02/15/14 0531 02/16/14 0029  NA 141 141 140 140 135*  K 5.3 5.2 5.5* 4.9 5.4*  CL 105 105 104 104 104  CO2 20 20 21 20  15*  GLUCOSE 88 100* 107* 91 124*  BUN 54* 48* 46* 45* 48*  CREATININE 1.83* 1.81* 1.77* 1.79* 1.77*  CALCIUM 9.2 9.2 9.3 9.2 9.2   Liver Function Tests:  Recent Labs Lab 02/12/14 1604 02/13/14 0130  AST 34 28  ALT 45* 40*  ALKPHOS 152* 142*  BILITOT 0.3 0.4  PROT 8.4* 7.9  ALBUMIN 4.1 3.8   No results found for this basename: LIPASE, AMYLASE,  in the last 168 hours No results found for this basename: AMMONIA,  in the last 168  hours CBC:  Recent Labs Lab 02/12/14 1604 02/14/14 0306 02/16/14 0029  WBC 11.7* 10.8* 20.0*  NEUTROABS 7.1  --  17.5*  HGB 14.0 14.9 14.6  HCT 43.7 46.9* 44.6  MCV 90.9 89.8 91.2  PLT 311 290 230   Cardiac Enzymes:  Recent Labs Lab 02/12/14 2126 02/13/14 0130 02/13/14 0720  TROPONINI <0.30 <0.30 <0.30   BNP (last 3 results)  Recent Labs  01/29/14 2030 02/12/14 1604  PROBNP 1482.0* 1430.0*   CBG:  Recent Labs Lab 02/14/14 2126 02/16/14 0422 02/16/14 0637 02/16/14 0656 02/16/14 1100  GLUCAP 95 114* 95 95 120*    Recent Results (from the past 240 hour(s))  MRSA PCR SCREENING     Status: None   Collection Time    02/12/14  7:53 PM      Result Value Ref Range Status   MRSA by PCR NEGATIVE  NEGATIVE Final   Comment:            The GeneXpert MRSA Assay (FDA     approved for NASAL specimens     only), is one component of a     comprehensive MRSA colonization     surveillance program. It is not     intended to diagnose MRSA     infection nor to guide or     monitor treatment for     MRSA infections.     Studies: Dg Chest 2 View  02/16/2014   CLINICAL DATA:  Weakness.  History of Crohn's disease.  EXAM: CHEST  2 VIEW  COMPARISON:  01/19/2014  FINDINGS: Cardiac pacemaker is unchanged. The heart size and mediastinal contours are within normal limits. Both lungs are clear. The visualized skeletal structures are unremarkable.  IMPRESSION: No active cardiopulmonary disease.   Electronically Signed   By: Lucienne Capers M.D.   On: 02/16/2014 00:08   Dg Chest 2 View  02/15/2014   CLINICAL DATA:  Pacemaker placement.  EXAM: CHEST  2 VIEW  COMPARISON:  One-view chest 02/12/2014.  FINDINGS: The heart is mildly enlarged. There is no edema or effusion to suggest failure. Minimal bibasilar atelectasis is evident. A dual-chamber pacemaker is in place via a left subclavian approach. The leads are in the right atrium and right ventricle. Mild curvature is again noted within  the upper lumbar spine. The visualized soft tissues and bony thorax are otherwise unremarkable.  IMPRESSION: 1. Interval placement of dual lead pacemaker without radiographic evidence for complication. 2. Minimal bibasilar atelectasis. 3. Borderline cardiomegaly without failure.   Electronically Signed   By: Lawrence Santiago M.D.   On: 02/15/2014 07:48    Scheduled Meds: . carvedilol  6.25 mg Oral BID WC  . ceFEPime (MAXIPIME) IV  1 g Intravenous Q24H  . feeding supplement (GLUCERNA SHAKE)  237 mL Oral Q24H  . furosemide  40 mg Oral Daily  . insulin aspart  0-9 Units Subcutaneous TID WC  . multivitamin with minerals  1 tablet Oral Daily  . sodium chloride  3 mL Intravenous Q12H  . sodium chloride  3 mL Intravenous Q12H  . [START ON 02/17/2014] vancomycin  750 mg Intravenous Q24H   Continuous Infusions:   Principal Problem:   Fever Active Problems:   Sinus bradycardia   DM (diabetes mellitus)   OSA on  CPAP   Pulmonary hypertension   CAD (coronary artery disease)   Hypertension   Diastolic CHF   Pacemaker-Medtronic   Crohn's disease    Time spent: 35 min    Kelvin Cellar  [facility] Pager [phone]. If 7PM-7AM, please contact night-coverage at [email or web address], password [facility] 02/16/2014, 3:35 PM  LOS: 1 day

## 2014-02-16 NOTE — Progress Notes (Signed)
INITIAL NUTRITION ASSESSMENT  DOCUMENTATION CODES Per approved criteria  -Not Applicable   INTERVENTION: Provide Glucerna Shakes once daily Provide Snacks BID Provide Multivitamin with minerals daily  NUTRITION DIAGNOSIS: Inadequate oral intake related to poor appetite as evidenced by 5% weight loss in 3 weeks and meal completion 25%.   Goal: Pt to meet >/= 90% of their estimated nutrition needs   Monitor:  PO intake, weight trend, labs  Reason for Assessment: Malnutrition Screening Tool, score of 3  73 y.o. female  Admitting Dx: Fever  ASSESSMENT: 73 yo female with history of CHF, HTN, diabetes, Crohn's disease, and OSA on CPAP; s/p pacemaker placement 2 days ago, comes in with fever over 102.  RD familiar with patient from previous admission. Pt had reported usual body weight of 185 lbs with unintentional weight loss due to poor appetite since starting Metformin. Pt continues to lose weight with additional 8 lb weight loss in the past 3 weeks. Per nursing notes pt is eating 25% of meals. 7/15 to 7/16 pt was eating 100% of meals while admitted.   Height: Ht Readings from Last 1 Encounters:  02/16/14 5' 2"  (1.575 m)    Weight: Wt Readings from Last 1 Encounters:  02/16/14 159 lb 3.2 oz (72.213 kg)    Ideal Body Weight: 110 lbs  % Ideal Body Weight: 145%  Wt Readings from Last 10 Encounters:  02/16/14 159 lb 3.2 oz (72.213 kg)  02/15/14 163 lb 2.3 oz (74 kg)  02/15/14 163 lb 2.3 oz (74 kg)  02/15/14 163 lb 2.3 oz (74 kg)  02/01/14 166 lb 1.6 oz (75.342 kg)  01/30/14 167 lb  Usual Body Weight: 185 lbs  % Usual Body Weight: 86%  BMI:  Body mass index is 29.11 kg/(m^2).  Estimated Nutritional Needs: Kcal: 1600-1800 Protein: 75-85 grams Fluid: 1.8 L/day  Skin: intact; closed incision on left chest  Diet Order: Cardiac  EDUCATION NEEDS: -No education needs identified at this time   Intake/Output Summary (Last 24 hours) at 02/16/14 0948 Last data  filed at 02/16/14 0946  Gross per 24 hour  Intake    240 ml  Output    200 ml  Net     40 ml    Last BM: 7/31  Labs:   Recent Labs Lab 02/14/14 0306 02/15/14 0531 02/16/14 0029  NA 140 140 135*  K 5.5* 4.9 5.4*  CL 104 104 104  CO2 21 20 15*  BUN 46* 45* 48*  CREATININE 1.77* 1.79* 1.77*  CALCIUM 9.3 9.2 9.2  GLUCOSE 107* 91 124*    CBG (last 3)   Recent Labs  02/16/14 0422 02/16/14 0637 02/16/14 0656  GLUCAP 114* 95 95    Scheduled Meds: . carvedilol  6.25 mg Oral BID WC  . furosemide  40 mg Oral Daily  . insulin aspart  0-9 Units Subcutaneous TID WC  . piperacillin-tazobactam (ZOSYN)  IV  3.375 g Intravenous Q8H  . sodium chloride  3 mL Intravenous Q12H  . sodium chloride  3 mL Intravenous Q12H    Continuous Infusions:   Past Medical History  Diagnosis Date  . CHF (congestive heart failure)   . Hypertension   . Murmur, cardiac   . Pulmonary hypertension   . Crohn's disease   . 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 Sinus bradycardia 01/29/2014  . High cholesterol   . OSA on CPAP   . Type II diabetes mellitus   . GERD (gastroesophageal reflux disease)   . Migraine     "  1-2 times/yr" (02/16/2014)  . Chronic back pain   . Skin cancer 2003    "3 cut off face" (02/16/2014)    Past Surgical History  Procedure Laterality Date  . Insert / replace / remove pacemaker  02/14/2014  . Vaginal hysterectomy  1972  . Cataract extraction Bilateral 1982  . Cardiac catheterization  1995; 2005  . Skin cancer excision  2003     "face X 3" (02/16/2014)    Pryor Ochoa RD, LDN Inpatient Clinical Dietitian Pager: [phone] After Hours Pager: [phone]

## 2014-02-17 DIAGNOSIS — E875 Hyperkalemia: Secondary | ICD-10-CM

## 2014-02-17 LAB — BASIC METABOLIC PANEL
Anion gap: 12 (ref 5–15)
BUN: 50 mg/dL — ABNORMAL HIGH (ref 6–23)
CO2: 22 mEq/L (ref 19–32)
Calcium: 9.2 mg/dL (ref 8.4–10.5)
Chloride: 100 mEq/L (ref 96–112)
Creatinine, Ser: 1.79 mg/dL — ABNORMAL HIGH (ref 0.50–1.10)
GFR calc Af Amer: 31 mL/min — ABNORMAL LOW (ref >90)
GFR calc non Af Amer: 27 mL/min — ABNORMAL LOW (ref >90)
Glucose, Bld: 157 mg/dL — ABNORMAL HIGH (ref 70–99)
Potassium: 4.7 mEq/L (ref 3.7–5.3)
Sodium: 134 mEq/L — ABNORMAL LOW (ref 137–147)

## 2014-02-17 LAB — CBC
HCT: 41.5 % (ref 36.0–46.0)
Hemoglobin: 13.5 g/dL (ref 12.0–15.0)
MCH: 29 pg (ref 26.0–34.0)
MCHC: 32.5 g/dL (ref 30.0–36.0)
MCV: 89.2 fL (ref 78.0–100.0)
Platelets: 230 10*3/uL (ref 150–400)
RBC: 4.65 MIL/uL (ref 3.87–5.11)
RDW: 14.6 % (ref 11.5–15.5)
WBC: 10.1 10*3/uL (ref 4.0–10.5)

## 2014-02-17 LAB — GLUCOSE, CAPILLARY
Glucose-Capillary: 110 mg/dL — ABNORMAL HIGH (ref 70–99)
Glucose-Capillary: 170 mg/dL — ABNORMAL HIGH (ref 70–99)
Glucose-Capillary: 124 mg/dL — ABNORMAL HIGH (ref 70–99)
Glucose-Capillary: 92 mg/dL (ref 70–99)

## 2014-02-17 MED ORDER — ARTIFICIAL TEARS OP OINT
TOPICAL_OINTMENT | Freq: Three times a day (TID) | OPHTHALMIC | Status: DC
  Administered 2014-02-17 – 2014-02-18 (×3): via OPHTHALMIC
  Filled 2014-02-17: qty 3.5

## 2014-02-17 NOTE — Progress Notes (Signed)
Patient ID: Heather [name], female   DOB: 09-14-1940, 73 y.o.   MRN: [id number]         [facility]    Date of Admission:  02/15/2014           Day 2 vancomycin and cefepime  Principal Problem:   Fever Active Problems:   Pacemaker-Medtronic   Sinus bradycardia   DM (diabetes mellitus)   Hypertension   Diastolic CHF   Hyperkalemia   . artificial tears   Both Eyes 3 times per day  . carvedilol  6.25 mg Oral BID WC  . ceFEPime (MAXIPIME) IV  1 g Intravenous Q24H  . feeding supplement (GLUCERNA SHAKE)  237 mL Oral Q24H  . furosemide  40 mg Oral Daily  . insulin aspart  0-9 Units Subcutaneous TID WC  . multivitamin with minerals  1 tablet Oral Daily  . sodium chloride  3 mL Intravenous Q12H  . sodium chloride  3 mL Intravenous Q12H  . vancomycin  750 mg Intravenous Q24H    Subjective: She is feeling better.  Past Medical History  Diagnosis Date  . CHF (congestive heart failure)   . Hypertension   . Murmur, cardiac   . Pulmonary hypertension   . Crohn's disease   . 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 Sinus bradycardia 01/29/2014  . High cholesterol   . OSA on CPAP   . Type II diabetes mellitus   . GERD (gastroesophageal reflux disease)   . Migraine     "1-2 times/yr" (02/16/2014)  . Chronic back pain   . Skin cancer 2003    "3 cut off face" (02/16/2014)    History  Substance Use Topics  . Smoking status: Never Smoker   . Smokeless tobacco: Never Used  . Alcohol Use: No    Family History  Problem Relation Age of Onset  . Adopted: Yes    No Known Allergies  Objective: Temp:  [97.8 F (36.6 C)-99 F (37.2 C)] 98.1 F (36.7 C) (08/01 0947) Pulse Rate:  [69-97] 70 (08/01 0947) Resp:  [14-20] 18 (08/01 0947) BP: (111-123)/(47-89) 111/47 mmHg (08/01 0947) SpO2:  [96 %-100 %] 98 % (08/01 0947) Weight:  [71.8 kg (158 lb 4.6 oz)] 71.8 kg (158 lb 4.6 oz) (08/01 0629)  General: She is seen today with her daughter present and Dr. Caryl Comes  who reports no change in the incision over her new pacemaker. She is smiling and in good spirits.  Lab Results Lab Results  Component Value Date   WBC 10.1 02/17/2014   HGB 13.5 02/17/2014   HCT 41.5 02/17/2014   MCV 89.2 02/17/2014   PLT 230 02/17/2014    Lab Results  Component Value Date   CREATININE 1.77* 02/16/2014   BUN 48* 02/16/2014   NA 135* 02/16/2014   K 5.4* 02/16/2014   CL 104 02/16/2014   CO2 15* 02/16/2014    Lab Results  Component Value Date   ALT 40* 02/13/2014   AST 28 02/13/2014   ALKPHOS 142* 02/13/2014   BILITOT 0.4 02/13/2014      Microbiology: Recent Results (from the past 240 hour(s))  MRSA PCR SCREENING     Status: None   Collection Time    02/12/14  7:53 PM      Result Value Ref Range Status   MRSA by PCR NEGATIVE  NEGATIVE Final   Comment:            The GeneXpert MRSA Assay (FDA  approved for NASAL specimens     only), is one component of a     comprehensive MRSA colonization     surveillance program. It is not     intended to diagnose MRSA     infection nor to guide or     monitor treatment for     MRSA infections.    Studies/Results: Dg Chest 2 View  02/16/2014   CLINICAL DATA:  Weakness.  History of Crohn's disease.  EXAM: CHEST  2 VIEW  COMPARISON:  01/19/2014  FINDINGS: Cardiac pacemaker is unchanged. The heart size and mediastinal contours are within normal limits. Both lungs are clear. The visualized skeletal structures are unremarkable.  IMPRESSION: No active cardiopulmonary disease.   Electronically Signed   By: Lucienne Capers M.D.   On: 02/16/2014 00:08    Assessment: The source for recent fever remains unclear. I favor continuing empiric antibiotic therapy pending further observation and culture results.  Plan: 1. Continue current antibiotics 2. I will followup tomorrow  Michel Bickers, MD [place] [facility] [phone] pager   [phone] cell 02/17/2014, 12:06 PM

## 2014-02-17 NOTE — Progress Notes (Signed)
[facility] PROGRESS NOTE  Heather [name] OBS:[id number] DOB: April 21, 1941 DOA: 02/15/2014 PCP: No PCP Per Patient  Assessment/Plan: 1. Sepsis -Present on admission, evidenced by a white count of 20,000, temperature of 102 at home reported by patient, having temperature of 101.3 overnight, with source of infection possibly coming from recent right groin temporary pacer placement or permanent pacemaker implant. -Infectious disease consulted, was started on empiric broad-spectrum IV antibiotic therapy with cefepime and vancomycin -Blood cultures are pending  -White count trending down to 10.1  2.  Chronic diastolic congestive heart failure -Last transthoracic echocardiogram performed on 01/30/2014 showing ejection fraction of 60-65% without wall motion abnormalities, grade 2 diastolic dysfunction -Clinically compensated -Continue Lasix 40 mg by mouth daily and Coreg 6.25 mg by mouth twice a day  3. 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 Diabetes mellitus -Blood sugars controlled  Code Status: Full Code Family Communication:  Disposition Plan: Continue IV antibiotics, follow up on cultures   Consultants:  Cardiology  Infectious Disease  Antibiotics:  Cefepime  Vancomycin  HPI/Subjective: Patient is a pleasant 73 year old female with a past medical history of diastolic congestive heart failure, sinus node this function with bradycardia, he was receiving mid to the medicine service on 01/29/2014, discharged on 02/01/2014 at which time she was found to be bradycardic, felt to be as a consequence of digoxin toxicity. She was readmitted on 02/12/2014 presenting with generalized weakness, fatigue, found to be bradycardic have a heart rate of 30. During that hospitalization she underwent dual-chamber pacemaker implantation on 02/14/2014. She was discharged from the cardiology service on 02/15/2014. She was readmitted overnight, presenting  with complaints of fever, having a temperature 102 at home. Initial labs revealed significant upward trend in her white count from 10,800 on 02/14/2014 to 20,000 on 02/16/2014. Patient was seen and evaluated by infectious disease, started on broad-spectrum empiric IV antibiotic therapy with cefepime and vancomycin. Chest x-ray did not reveal active cardiopulmonary disease, urinalysis was unremarkable. Pacer pocket infection was suspected although there was not obvious clinical evidence of infection.   Patient reports feeling a little better, still having poor appetitie.   Objective: Filed Vitals:   02/17/14 0947  BP: 111/47  Pulse: 70  Temp: 98.1 F (36.7 C)  Resp: 18    Intake/Output Summary (Last 24 hours) at 02/17/14 1400 Last data filed at 02/16/14 4765  Gross per 24 hour  Intake    240 ml  Output    300 ml  Net    -60 ml   Filed Weights   02/16/14 0411 02/17/14 0629  Weight: 72.213 kg (159 lb 3.2 oz) 71.8 kg (158 lb 4.6 oz)    Exam:   General:  Ill appearing, no acute distress, awake and alert  Cardiovascular: Regular rate and rhythm, normal S1S2  Respiratory: Clear to auscultation bilaterally  Abdomen: Soft nontender nondistended  Musculoskeletal: no edema  Skin: Pacer site with some associated erythema, there was no purulence or fluctuant mass noted.   Data Reviewed: Basic Metabolic Panel:  Recent Labs Lab 02/13/14 1535 02/14/14 0306 02/15/14 0531 02/16/14 0029 02/17/14 1128  NA 141 140 140 135* 134*  K 5.2 5.5* 4.9 5.4* 4.7  CL 105 104 104 104 100  CO2 20 21 20  15* 22  GLUCOSE 100* 107* 91 124* 157*  BUN 48* 46* 45* 48* 50*  CREATININE 1.81* 1.77* 1.79* 1.77* 1.79*  CALCIUM 9.2 9.3 9.2 9.2 9.2   Liver Function Tests:  Recent Labs Lab 02/12/14 1604 02/13/14  0130  AST 34 28  ALT 45* 40*  ALKPHOS 152* 142*  BILITOT 0.3 0.4  PROT 8.4* 7.9  ALBUMIN 4.1 3.8   No results found for this basename: LIPASE, AMYLASE,  in the last 168 hours No  results found for this basename: AMMONIA,  in the last 168 hours CBC:  Recent Labs Lab 02/12/14 1604 02/14/14 0306 02/16/14 0029 02/17/14 1128  WBC 11.7* 10.8* 20.0* 10.1  NEUTROABS 7.1  --  17.5*  --   HGB 14.0 14.9 14.6 13.5  HCT 43.7 46.9* 44.6 41.5  MCV 90.9 89.8 91.2 89.2  PLT 311 290 230 230   Cardiac Enzymes:  Recent Labs Lab 02/12/14 2126 02/13/14 0130 02/13/14 0720  TROPONINI <0.30 <0.30 <0.30   BNP (last 3 results)  Recent Labs  01/29/14 2030 02/12/14 1604  PROBNP 1482.0* 1430.0*   CBG:  Recent Labs Lab 02/16/14 1100 02/16/14 1624 02/16/14 2016 02/17/14 0626 02/17/14 1111  GLUCAP 120* 96 113* 110* 170*    Recent Results (from the past 240 hour(s))  MRSA PCR SCREENING     Status: None   Collection Time    02/12/14  7:53 PM      Result Value Ref Range Status   MRSA by PCR NEGATIVE  NEGATIVE Final   Comment:            The GeneXpert MRSA Assay (FDA     approved for NASAL specimens     only), is one component of a     comprehensive MRSA colonization     surveillance program. It is not     intended to diagnose MRSA     infection nor to guide or     monitor treatment for     MRSA infections.     Studies: Dg Chest 2 View  02/16/2014   CLINICAL DATA:  Weakness.  History of Crohn's disease.  EXAM: CHEST  2 VIEW  COMPARISON:  01/19/2014  FINDINGS: Cardiac pacemaker is unchanged. The heart size and mediastinal contours are within normal limits. Both lungs are clear. The visualized skeletal structures are unremarkable.  IMPRESSION: No active cardiopulmonary disease.   Electronically Signed   By: Lucienne Capers M.D.   On: 02/16/2014 00:08    Scheduled Meds: . artificial tears   Both Eyes 3 times per day  . carvedilol  6.25 mg Oral BID WC  . ceFEPime (MAXIPIME) IV  1 g Intravenous Q24H  . feeding supplement (GLUCERNA SHAKE)  237 mL Oral Q24H  . furosemide  40 mg Oral Daily  . insulin aspart  0-9 Units Subcutaneous TID WC  . multivitamin with  minerals  1 tablet Oral Daily  . sodium chloride  3 mL Intravenous Q12H  . sodium chloride  3 mL Intravenous Q12H  . vancomycin  750 mg Intravenous Q24H   Continuous Infusions:   Principal Problem:   Fever Active Problems:   Sinus bradycardia   DM (diabetes mellitus)   Hypertension   Diastolic CHF   Pacemaker-Medtronic   Hyperkalemia    Time spent: 25 min    Kelvin Cellar  [facility] Pager [phone]. If 7PM-7AM, please contact night-coverage at [email or web address], password [facility] 02/17/2014, 2:00 PM  LOS: 2 days

## 2014-02-17 NOTE — Progress Notes (Signed)
Patient Name: Heather [name]      SUBJECTIVE:feels better   Past Medical History  Diagnosis Date  . CHF (congestive heart failure)   . Hypertension   . Murmur, cardiac   . Pulmonary hypertension   . Crohn's disease   . 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 Sinus bradycardia 01/29/2014  . High cholesterol   . OSA on CPAP   . Type II diabetes mellitus   . GERD (gastroesophageal reflux disease)   . Migraine     "1-2 times/yr" (02/16/2014)  . Chronic back pain   . Skin cancer 2003    "3 cut off face" (02/16/2014)    Scheduled Meds:  Scheduled Meds: . carvedilol  6.25 mg Oral BID WC  . ceFEPime (MAXIPIME) IV  1 g Intravenous Q24H  . feeding supplement (GLUCERNA SHAKE)  237 mL Oral Q24H  . furosemide  40 mg Oral Daily  . insulin aspart  0-9 Units Subcutaneous TID WC  . multivitamin with minerals  1 tablet Oral Daily  . sodium chloride  3 mL Intravenous Q12H  . sodium chloride  3 mL Intravenous Q12H  . vancomycin  750 mg Intravenous Q24H   Continuous Infusions:  sodium chloride, acetaminophen, acetaminophen, sodium chloride    PHYSICAL EXAM Filed Vitals:   02/16/14 2312 02/17/14 0129 02/17/14 0629 02/17/14 0947  BP:  112/47 121/89 111/47  Pulse: 97 69 70 70  Temp:  98.6 F (37 C) 97.8 F (36.6 C) 98.1 F (36.7 C)  TempSrc:  Oral Oral Oral  Resp: 14 20 18 18   Height:      Weight:   158 lb 4.6 oz (71.8 kg)   SpO2: 99% 96% 98% 98%    NAD Clear RRR Wound ok  No edema  TELEMETRY: Reviewed telemetry pt in a pacing   Intake/Output Summary (Last 24 hours) at 02/17/14 1058 Last data filed at 02/16/14 1848  Gross per 24 hour  Intake    410 ml  Output    800 ml  Net   -390 ml    LABS: Basic Metabolic Panel:  Recent Labs Lab 02/12/14 1604 02/12/14 1710 02/13/14 0130 02/13/14 1535 02/14/14 0306 02/15/14 0531 02/16/14 0029  NA 140  --  141 141 140 140 135*  K 6.1* 6.4* 5.3 5.2 5.5* 4.9 5.4*  CL 103  --  105 105 104 104 104  CO2 21  --  20 20 21  20  15*  GLUCOSE 92  --  88 100* 107* 91 124*  BUN 59*  --  54* 48* 46* 45* 48*  CREATININE 2.30*  --  1.83* 1.81* 1.77* 1.79* 1.77*  CALCIUM 9.4  --  9.2 9.2 9.3 9.2 9.2   Cardiac Enzymes: No results found for this basename: CKTOTAL, CKMB, CKMBINDEX, TROPONINI,  in the last 72 hours CBC:  Recent Labs Lab 02/12/14 1604 02/14/14 0306 02/16/14 0029  WBC 11.7* 10.8* 20.0*  NEUTROABS 7.1  --  17.5*  HGB 14.0 14.9 14.6  HCT 43.7 46.9* 44.6  MCV 90.9 89.8 91.2  PLT 311 290 230   PROTIME: No results found for this basename: LABPROT, INR,  in the last 72 hours Liver Function Tests: No results found for this basename: AST, ALT, ALKPHOS, BILITOT, PROT, ALBUMIN,  in the last 72 hours No results found for this basename: LIPASE, AMYLASE,  in the last 72 hours BNP: BNP (last 3 results)  Recent Labs  01/29/14 2030 02/12/14 1604  PROBNP 1482.0* 1430.0*  ASSESSMENT AND PLAN:  Principal Problem:   Fever Active Problems:   Sinus bradycardia   DM (diabetes mellitus)   Hypertension   Diastolic CHF   Pacemaker-Medtronic   Hyperkalemia  have reordered K and CBC  Await ID input    Will see again monday   Signed, Virl Axe MD  02/17/2014

## 2014-02-18 LAB — GLUCOSE, CAPILLARY
Glucose-Capillary: 106 mg/dL — ABNORMAL HIGH (ref 70–99)
Glucose-Capillary: 130 mg/dL — ABNORMAL HIGH (ref 70–99)
Glucose-Capillary: 110 mg/dL — ABNORMAL HIGH (ref 70–99)
Glucose-Capillary: 119 mg/dL — ABNORMAL HIGH (ref 70–99)

## 2014-02-18 LAB — BASIC METABOLIC PANEL
Anion gap: 13 (ref 5–15)
BUN: 55 mg/dL — ABNORMAL HIGH (ref 6–23)
CO2: 23 mEq/L (ref 19–32)
Calcium: 9.1 mg/dL (ref 8.4–10.5)
Chloride: 98 mEq/L (ref 96–112)
Creatinine, Ser: 1.88 mg/dL — ABNORMAL HIGH (ref 0.50–1.10)
GFR calc Af Amer: 29 mL/min — ABNORMAL LOW (ref >90)
GFR calc non Af Amer: 25 mL/min — ABNORMAL LOW (ref >90)
Glucose, Bld: 102 mg/dL — ABNORMAL HIGH (ref 70–99)
Potassium: 4.9 mEq/L (ref 3.7–5.3)
Sodium: 134 mEq/L — ABNORMAL LOW (ref 137–147)

## 2014-02-18 MED ORDER — POLYVINYL ALCOHOL 1.4 % OP SOLN
1.0000 [drp] | Freq: Two times a day (BID) | OPHTHALMIC | Status: DC
  Administered 2014-02-18 – 2014-02-20 (×5): 1 [drp] via OPHTHALMIC
  Filled 2014-02-18: qty 15

## 2014-02-18 NOTE — Progress Notes (Signed)
Patient ID: Heather [name], female   DOB: 1941/05/01, 73 y.o.   MRN: [id number]         [facility] for Infectious Disease    Date of Admission:  02/15/2014   Total days of antibiotics 3         Principal Problem:   Fever Active Problems:   Pacemaker-Medtronic   Sinus bradycardia   DM (diabetes mellitus)   Hypertension   Diastolic CHF   Hyperkalemia   . carvedilol  6.25 mg Oral BID WC  . ceFEPime (MAXIPIME) IV  1 g Intravenous Q24H  . feeding supplement (GLUCERNA SHAKE)  237 mL Oral Q24H  . furosemide  40 mg Oral Daily  . insulin aspart  0-9 Units Subcutaneous TID WC  . multivitamin with minerals  1 tablet Oral Daily  . polyvinyl alcohol  1 drop Both Eyes BID  . sodium chloride  3 mL Intravenous Q12H  . sodium chloride  3 mL Intravenous Q12H  . vancomycin  750 mg Intravenous Q24H    Subjective: She did not sleep well because of problems with her CPAP machine and feels tired today. Her appetite is not good according to her daughter. Review of Systems: Pertinent items are noted in HPI.  Past Medical History  Diagnosis Date  . CHF (congestive heart failure)   . Hypertension   . Murmur, cardiac   . Pulmonary hypertension   . Crohn's disease   . 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 Sinus bradycardia 01/29/2014  . High cholesterol   . OSA on CPAP   . Type II diabetes mellitus   . GERD (gastroesophageal reflux disease)   . Migraine     "1-2 times/yr" (02/16/2014)  . Chronic back pain   . Skin cancer 2003    "3 cut off face" (02/16/2014)    History  Substance Use Topics  . Smoking status: Never Smoker   . Smokeless tobacco: Never Used  . Alcohol Use: No    Family History  Problem Relation Age of Onset  . Adopted: Yes    No Known Allergies  Objective: Temp:  [98.2 F (36.8 C)-98.5 F (36.9 C)] 98.2 F (36.8 C) (08/02 0500) Pulse Rate:  [64-78] 78 (08/02 0500) Resp:  [18-19] 19 (08/02 0500) BP: (116-134)/(54-58) 134/58 mmHg (08/02 0500) SpO2:  [97 %-99  %] 97 % (08/02 0500) Weight:  [71.8 kg (158 lb 4.6 oz)] 71.8 kg (158 lb 4.6 oz) (08/02 0500)  General: She is alert and in no distress Skin: No rash Lungs: Clear Cor: Regular S1 and S2 no murmur. Anterior chest pacemaker site appears normal Abdomen: Soft and nontender. Some bruising in her right groin but no signs of infection  Lab Results Lab Results  Component Value Date   WBC 10.1 02/17/2014   HGB 13.5 02/17/2014   HCT 41.5 02/17/2014   MCV 89.2 02/17/2014   PLT 230 02/17/2014    Lab Results  Component Value Date   CREATININE 1.88* 02/18/2014   BUN 55* 02/18/2014   NA 134* 02/18/2014   K 4.9 02/18/2014   CL 98 02/18/2014   CO2 23 02/18/2014    Lab Results  Component Value Date   ALT 40* 02/13/2014   AST 28 02/13/2014   ALKPHOS 142* 02/13/2014   BILITOT 0.4 02/13/2014      Microbiology: Recent Results (from the past 240 hour(s))  MRSA PCR SCREENING     Status: None   Collection Time    02/12/14  7:53 PM  Result Value Ref Range Status   MRSA by PCR NEGATIVE  NEGATIVE Final   Comment:            The GeneXpert MRSA Assay (FDA     approved for NASAL specimens     only), is one component of a     comprehensive MRSA colonization     surveillance program. It is not     intended to diagnose MRSA     infection nor to guide or     monitor treatment for     MRSA infections.  CULTURE, BLOOD (ROUTINE X 2)     Status: None   Collection Time    02/16/14 12:19 AM      Result Value Ref Range Status   Specimen Description BLOOD RIGHT ARM   Final   Special Requests BOTTLES DRAWN AEROBIC AND ANAEROBIC [facility]   Final   Culture  Setup Time     Final   Value: 02/16/2014 08:27     Performed at [facility]   Culture     Final   Value:        BLOOD CULTURE RECEIVED NO GROWTH TO DATE CULTURE WILL BE HELD FOR 5 DAYS BEFORE ISSUING A FINAL NEGATIVE REPORT     Performed at [facility]   Report Status PENDING   Incomplete  CULTURE, BLOOD (ROUTINE X 2)     Status: None   Collection Time      02/16/14 12:29 AM      Result Value Ref Range Status   Specimen Description BLOOD RIGHT FOREARM   Final   Special Requests BOTTLES DRAWN AEROBIC ONLY 6CC   Final   Culture  Setup Time     Final   Value: 02/16/2014 08:28     Performed at [facility]   Culture     Final   Value:        BLOOD CULTURE RECEIVED NO GROWTH TO DATE CULTURE WILL BE HELD FOR 5 DAYS BEFORE ISSUING A FINAL NEGATIVE REPORT     Performed at [facility]   Report Status PENDING   Incomplete   Assessment: Source for her recent fever remains unclear but there is no convincing evidence of blood stream infection or new pacemaker infection.  Plan: 1. Discontinue vancomycin and cefepime and observe off of antibiotics  Michel Bickers, MD [facility] for [place] [phone] pager   [phone] cell 02/18/2014, 12:46 PM

## 2014-02-18 NOTE — Progress Notes (Signed)
Patient family member had already applied CPAP when RT entered the room.  Patient family member asked if I could turn the pressure down.  Adjusted the auto settings to maximum of 15cmH2O and minimum of 4 cmH2O.  Patient is tolerating the CPAP at this time. RT will continue to monitor.

## 2014-02-18 NOTE — Progress Notes (Signed)
[facility] PROGRESS NOTE  Nelia Shi TSV:[id number] DOB: October 30, 1940 DOA: 02/15/2014 PCP: No PCP Per Patient  Assessment/Plan: 1. Fever -.Unclear origin as workup thus far has been negative.  -Blood cultures drawn on admission remain negative, surgical incision site appearing well with out evidence of active infection.  -CXR and UA having not reveling source of infection.  -ID recommending observation off of antimicrobial therapy  2.  Chronic diastolic congestive heart failure -Last transthoracic echocardiogram performed on 01/30/2014 showing ejection fraction of 60-65% without wall motion abnormalities, grade 2 diastolic dysfunction -Clinically compensated -Continue Lasix 40 mg by mouth daily and Coreg 6.25 mg by mouth twice a day  3. 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 Diabetes mellitus -Blood sugars controlled  Code Status: Full Code Family Communication:  Disposition Plan: Continue IV antibiotics, follow up on cultures   Consultants:  Cardiology  Infectious Disease  Antibiotics:  Cefepime  Vancomycin  HPI/Subjective: Patient is a pleasant 73 year old female with a past medical history of diastolic congestive heart failure, sinus node this function with bradycardia, he was receiving mid to the medicine service on 01/29/2014, discharged on 02/01/2014 at which time she was found to be bradycardic, felt to be as a consequence of digoxin toxicity. She was readmitted on 02/12/2014 presenting with generalized weakness, fatigue, found to be bradycardic have a heart rate of 30. During that hospitalization she underwent dual-chamber pacemaker implantation on 02/14/2014. She was discharged from the cardiology service on 02/15/2014. She was readmitted overnight, presenting with complaints of fever, having a temperature 102 at home. Initial labs revealed significant upward trend in her white count from 10,800 on 02/14/2014 to  20,000 on 02/16/2014. Patient was seen and evaluated by infectious disease, started on broad-spectrum empiric IV antibiotic therapy with cefepime and vancomycin. Chest x-ray did not reveal active cardiopulmonary disease, urinalysis was unremarkable. Pacer pocket infection was suspected although there was not obvious clinical evidence of infection.  appetitie Patient reports feeling, she ambulated down the hallway with me. Remains afebrile   Objective: Filed Vitals:   02/18/14 0500  BP: 134/58  Pulse: 78  Temp: 98.2 F (36.8 C)  Resp: 19    Intake/Output Summary (Last 24 hours) at 02/18/14 1346 Last data filed at 02/18/14 1100  Gross per 24 hour  Intake    480 ml  Output    500 ml  Net    -20 ml   Filed Weights   02/16/14 0411 02/17/14 0629 02/18/14 0500  Weight: 72.213 kg (159 lb 3.2 oz) 71.8 kg (158 lb 4.6 oz) 71.8 kg (158 lb 4.6 oz)    Exam:   General:  Ill appearing, no acute distress, awake and alert  Cardiovascular: Regular rate and rhythm, normal S1S2  Respiratory: Clear to auscultation bilaterally  Abdomen: Soft nontender nondistended  Musculoskeletal: no edema  Skin: Pacer site with some associated erythema, there was no purulence or fluctuant mass noted.   Data Reviewed: Basic Metabolic Panel:  Recent Labs Lab 02/14/14 0306 02/15/14 0531 02/16/14 0029 02/17/14 1128 02/18/14 0414  NA 140 140 135* 134* 134*  K 5.5* 4.9 5.4* 4.7 4.9  CL 104 104 104 100 98  CO2 21 20 15* 22 23  GLUCOSE 107* 91 124* 157* 102*  BUN 46* 45* 48* 50* 55*  CREATININE 1.77* 1.79* 1.77* 1.79* 1.88*  CALCIUM 9.3 9.2 9.2 9.2 9.1   Liver Function Tests:  Recent Labs Lab 02/12/14 1604 02/13/14 0130  AST 34 28  ALT 45* 40*  ALKPHOS 152* 142*  BILITOT 0.3 0.4  PROT 8.4* 7.9  ALBUMIN 4.1 3.8   No results found for this basename: LIPASE, AMYLASE,  in the last 168 hours No results found for this basename: AMMONIA,  in the last 168 hours CBC:  Recent Labs Lab  02/12/14 1604 02/14/14 0306 02/16/14 0029 02/17/14 1128  WBC 11.7* 10.8* 20.0* 10.1  NEUTROABS 7.1  --  17.5*  --   HGB 14.0 14.9 14.6 13.5  HCT 43.7 46.9* 44.6 41.5  MCV 90.9 89.8 91.2 89.2  PLT 311 290 230 230   Cardiac Enzymes:  Recent Labs Lab 02/12/14 2126 02/13/14 0130 02/13/14 0720  TROPONINI <0.30 <0.30 <0.30   BNP (last 3 results)  Recent Labs  01/29/14 2030 02/12/14 1604  PROBNP 1482.0* 1430.0*   CBG:  Recent Labs Lab 02/17/14 1111 02/17/14 1636 02/17/14 2120 02/18/14 0631 02/18/14 1113  GLUCAP 170* 124* 92 106* 130*    Recent Results (from the past 240 hour(s))  MRSA PCR SCREENING     Status: None   Collection Time    02/12/14  7:53 PM      Result Value Ref Range Status   MRSA by PCR NEGATIVE  NEGATIVE Final   Comment:            The GeneXpert MRSA Assay (FDA     approved for NASAL specimens     only), is one component of a     comprehensive MRSA colonization     surveillance program. It is not     intended to diagnose MRSA     infection nor to guide or     monitor treatment for     MRSA infections.  CULTURE, BLOOD (ROUTINE X 2)     Status: None   Collection Time    02/16/14 12:19 AM      Result Value Ref Range Status   Specimen Description BLOOD RIGHT ARM   Final   Special Requests BOTTLES DRAWN AEROBIC AND ANAEROBIC [facility]   Final   Culture  Setup Time     Final   Value: 02/16/2014 08:27     Performed at [facility]   Culture     Final   Value:        BLOOD CULTURE RECEIVED NO GROWTH TO DATE CULTURE WILL BE HELD FOR 5 DAYS BEFORE ISSUING A FINAL NEGATIVE REPORT     Performed at [facility]   Report Status PENDING   Incomplete  CULTURE, BLOOD (ROUTINE X 2)     Status: None   Collection Time    02/16/14 12:29 AM      Result Value Ref Range Status   Specimen Description BLOOD RIGHT FOREARM   Final   Special Requests BOTTLES DRAWN AEROBIC ONLY 6CC   Final   Culture  Setup Time     Final   Value: 02/16/2014 08:28      Performed at [facility]   Culture     Final   Value:        BLOOD CULTURE RECEIVED NO GROWTH TO DATE CULTURE WILL BE HELD FOR 5 DAYS BEFORE ISSUING A FINAL NEGATIVE REPORT     Performed at [facility]   Report Status PENDING   Incomplete     Studies: No results found.  Scheduled Meds: . carvedilol  6.25 mg Oral BID WC  . feeding supplement (GLUCERNA SHAKE)  237 mL Oral Q24H  . furosemide  40 mg Oral Daily  .  insulin aspart  0-9 Units Subcutaneous TID WC  . multivitamin with minerals  1 tablet Oral Daily  . polyvinyl alcohol  1 drop Both Eyes BID  . sodium chloride  3 mL Intravenous Q12H  . sodium chloride  3 mL Intravenous Q12H   Continuous Infusions:   Principal Problem:   Fever Active Problems:   Sinus bradycardia   DM (diabetes mellitus)   Hypertension   Diastolic CHF   Pacemaker-Medtronic   Hyperkalemia    Time spent: 25 min    Kelvin Cellar  [facility] Pager [phone]. If 7PM-7AM, please contact night-coverage at [email or web address], password [facility] 02/18/2014, 1:46 PM  LOS: 3 days

## 2014-02-19 DIAGNOSIS — I2789 Other specified pulmonary heart diseases: Secondary | ICD-10-CM

## 2014-02-19 LAB — BASIC METABOLIC PANEL
Anion gap: 13 (ref 5–15)
BUN: 54 mg/dL — ABNORMAL HIGH (ref 6–23)
CO2: 24 mEq/L (ref 19–32)
Calcium: 9.2 mg/dL (ref 8.4–10.5)
Chloride: 100 mEq/L (ref 96–112)
Creatinine, Ser: 1.73 mg/dL — ABNORMAL HIGH (ref 0.50–1.10)
GFR calc Af Amer: 32 mL/min — ABNORMAL LOW (ref >90)
GFR calc non Af Amer: 28 mL/min — ABNORMAL LOW (ref >90)
Glucose, Bld: 102 mg/dL — ABNORMAL HIGH (ref 70–99)
Potassium: 5.1 mEq/L (ref 3.7–5.3)
Sodium: 137 mEq/L (ref 137–147)

## 2014-02-19 LAB — GLUCOSE, CAPILLARY
Glucose-Capillary: 97 mg/dL (ref 70–99)
Glucose-Capillary: 118 mg/dL — ABNORMAL HIGH (ref 70–99)
Glucose-Capillary: 132 mg/dL — ABNORMAL HIGH (ref 70–99)
Glucose-Capillary: 109 mg/dL — ABNORMAL HIGH (ref 70–99)

## 2014-02-19 LAB — CBC
HCT: 43.4 % (ref 36.0–46.0)
Hemoglobin: 14.4 g/dL (ref 12.0–15.0)
MCH: 29.5 pg (ref 26.0–34.0)
MCHC: 33.2 g/dL (ref 30.0–36.0)
MCV: 88.9 fL (ref 78.0–100.0)
Platelets: 270 10*3/uL (ref 150–400)
RBC: 4.88 MIL/uL (ref 3.87–5.11)
RDW: 14.3 % (ref 11.5–15.5)
WBC: 11.1 10*3/uL — ABNORMAL HIGH (ref 4.0–10.5)

## 2014-02-19 MED ORDER — TRAZODONE HCL 50 MG PO TABS
50.0000 mg | ORAL_TABLET | Freq: Every day | ORAL | Status: DC
  Administered 2014-02-19: 50 mg via ORAL
  Filled 2014-02-19 (×2): qty 1

## 2014-02-19 NOTE — Progress Notes (Signed)
Patient ID: Heather [name], female   DOB: [birth year], 73 y.o.   MRN: [id number]         [facility]    Date of Admission:  02/15/2014   Off of antibiotics x 24 hours         Principal Problem:   Fever Active Problems:   Pacemaker-Medtronic   Sinus bradycardia   DM (diabetes mellitus)   Hypertension   Diastolic CHF   Hyperkalemia   . carvedilol  6.25 mg Oral BID WC  . feeding supplement (GLUCERNA SHAKE)  237 mL Oral Q24H  . furosemide  40 mg Oral Daily  . insulin aspart  0-9 Units Subcutaneous TID WC  . multivitamin with minerals  1 tablet Oral Daily  . polyvinyl alcohol  1 drop Both Eyes BID  . sodium chloride  3 mL Intravenous Q12H  . traZODone  50 mg Oral QHS    Subjective: She is feeling better today. She has been up walking in the hallway.  Objective: Temp:  [97.4 F (36.3 C)-99.2 F (37.3 C)] 97.4 F (36.3 C) (08/03 0657) Pulse Rate:  [70] 70 (08/03 0657) Resp:  [16-20] 18 (08/03 0657) BP: (122-137)/(54-60) 137/58 mmHg (08/03 0657) SpO2:  [96 %-99 %] 96 % (08/03 0657) Weight:  [71.351 kg (157 lb 4.8 oz)] 71.351 kg (157 lb 4.8 oz) (08/03 0659)  General: She is alert and in no distress Skin: No rash Lungs: Clear Cor: Regular S1 and S2 no murmur. Anterior chest pacemaker site appears normal Abdomen: Soft and nontender.   Lab Results Lab Results  Component Value Date   WBC 11.1* 02/19/2014   HGB 14.4 02/19/2014   HCT 43.4 02/19/2014   MCV 88.9 02/19/2014   PLT 270 02/19/2014    Lab Results  Component Value Date   CREATININE 1.73* 02/19/2014   BUN 54* 02/19/2014   NA 137 02/19/2014   K 5.1 02/19/2014   CL 100 02/19/2014   CO2 24 02/19/2014    Lab Results  Component Value Date   ALT 40* 02/13/2014   AST 28 02/13/2014   ALKPHOS 142* 02/13/2014   BILITOT 0.4 02/13/2014      Microbiology: Recent Results (from the past 240 hour(s))  MRSA PCR SCREENING     Status: None   Collection Time    02/12/14  7:53 PM      Result Value Ref Range Status   MRSA by PCR NEGATIVE  NEGATIVE Final   Comment:            The GeneXpert MRSA Assay (FDA     approved for NASAL specimens     only), is one component of a     comprehensive MRSA colonization     surveillance program. It is not     intended to diagnose MRSA     infection nor to guide or     monitor treatment for     MRSA infections.  CULTURE, BLOOD (ROUTINE X 2)     Status: None   Collection Time    02/16/14 12:19 AM      Result Value Ref Range Status   Specimen Description BLOOD RIGHT ARM   Final   Special Requests BOTTLES DRAWN AEROBIC AND ANAEROBIC [facility]   Final   Culture  Setup Time     Final   Value: 02/16/2014 08:27     Performed at [facility]   Culture     Final   Value:  BLOOD CULTURE RECEIVED NO GROWTH TO DATE CULTURE WILL BE HELD FOR 5 DAYS BEFORE ISSUING A FINAL NEGATIVE REPORT     Performed at [facility]   Report Status PENDING   Incomplete  CULTURE, BLOOD (ROUTINE X 2)     Status: None   Collection Time    02/16/14 12:29 AM      Result Value Ref Range Status   Specimen Description BLOOD RIGHT FOREARM   Final   Special Requests BOTTLES DRAWN AEROBIC ONLY 6CC   Final   Culture  Setup Time     Final   Value: 02/16/2014 08:28     Performed at [facility]   Culture     Final   Value:        BLOOD CULTURE RECEIVED NO GROWTH TO DATE CULTURE WILL BE HELD FOR 5 DAYS BEFORE ISSUING A FINAL NEGATIVE REPORT     Performed at [facility]   Report Status PENDING   Incomplete   Assessment: She remains afebrile off of antibiotics without clear evidence for infection.  Plan: 1. Continue observation off of antibiotics 2. It is okay with me to discharge her home later today 3. I will arrange followup in her clinic this week  Michel Bickers, MD [facility] [place] [phone] pager   [phone] cell 02/19/2014, 12:49 PM

## 2014-02-19 NOTE — Progress Notes (Signed)
[facility] PROGRESS NOTE  Heather [name] ZSM:[id number] DOB: January 04, 1941 DOA: 02/15/2014 PCP: No PCP Per Patient  Assessment/Plan: 1. Fever -.Unclear origin as workup thus far has been negative.  -Blood cultures drawn on admission remain negative, surgical incision site appearing well with out evidence of active infection.  -CXR and UA having not reveling source of infection.  -Antibiotic therapy stopped on 02/18/2014 -She remains stable. Afebrile, white at 11,100.   2.  Chronic diastolic congestive heart failure -Last transthoracic echocardiogram performed on 01/30/2014 showing ejection fraction of 60-65% without wall motion abnormalities, grade 2 diastolic dysfunction -Clinically compensated -Continue Lasix 40 mg by mouth daily and Coreg 6.25 mg by mouth twice a day  3. 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 Diabetes mellitus -Blood sugars controlled  Code Status: Full Code Family Communication:  Disposition Plan: Anticipate discharge in the next 24 hours   Consultants:  Cardiology  Infectious Disease  Antibiotics:  Cefepime stopped on 02/18/2014  Vancomycin stopped on 02/18/2014  HPI/Subjective: Patient is a pleasant 73 year old female with a past medical history of diastolic congestive heart failure, sinus node this function with bradycardia, he was receiving mid to the medicine service on 01/29/2014, discharged on 02/01/2014 at which time she was found to be bradycardic, felt to be as a consequence of digoxin toxicity. She was readmitted on 02/12/2014 presenting with generalized weakness, fatigue, found to be bradycardic have a heart rate of 30. During that hospitalization she underwent dual-chamber pacemaker implantation on 02/14/2014. She was discharged from the cardiology service on 02/15/2014. She was readmitted overnight, presenting with complaints of fever, having a temperature 102 at home. Initial labs revealed  significant upward trend in her white count from 10,800 on 02/14/2014 to 20,000 on 02/16/2014. Patient was seen and evaluated by infectious disease, started on broad-spectrum empiric IV antibiotic therapy with cefepime and vancomycin. Chest x-ray did not reveal active cardiopulmonary disease, urinalysis was unremarkable. Pacer pocket infection was suspected although there was not obvious clinical evidence of infection.  appetitie Patient reports feeling, she ambulated down the hallway with me. Remains afebrile   Objective: Filed Vitals:   02/19/14 0657  BP: 137/58  Pulse: 70  Temp: 97.4 F (36.3 C)  Resp: 18    Intake/Output Summary (Last 24 hours) at 02/19/14 1318 Last data filed at 02/19/14 0920  Gross per 24 hour  Intake    483 ml  Output    750 ml  Net   -267 ml   Filed Weights   02/17/14 0629 02/18/14 0500 02/19/14 0659  Weight: 71.8 kg (158 lb 4.6 oz) 71.8 kg (158 lb 4.6 oz) 71.351 kg (157 lb 4.8 oz)    Exam:   General:  Ill appearing, no acute distress, awake and alert  Cardiovascular: Regular rate and rhythm, normal S1S2  Respiratory: Clear to auscultation bilaterally  Abdomen: Soft nontender nondistended  Musculoskeletal: no edema  Skin: Pacer site with some associated erythema, there was no purulence or fluctuant mass noted.   Data Reviewed: Basic Metabolic Panel:  Recent Labs Lab 02/15/14 0531 02/16/14 0029 02/17/14 1128 02/18/14 0414 02/19/14 0355  NA 140 135* 134* 134* 137  K 4.9 5.4* 4.7 4.9 5.1  CL 104 104 100 98 100  CO2 20 15* 22 23 24   GLUCOSE 91 124* 157* 102* 102*  BUN 45* 48* 50* 55* 54*  CREATININE 1.79* 1.77* 1.79* 1.88* 1.73*  CALCIUM 9.2 9.2 9.2 9.1 9.2   Liver Function Tests:  Recent Labs Lab 02/12/14 1604  02/13/14 0130  AST 34 28  ALT 45* 40*  ALKPHOS 152* 142*  BILITOT 0.3 0.4  PROT 8.4* 7.9  ALBUMIN 4.1 3.8   No results found for this basename: LIPASE, AMYLASE,  in the last 168 hours No results found for this  basename: AMMONIA,  in the last 168 hours CBC:  Recent Labs Lab 02/12/14 1604 02/14/14 0306 02/16/14 0029 02/17/14 1128 02/19/14 1200  WBC 11.7* 10.8* 20.0* 10.1 11.1*  NEUTROABS 7.1  --  17.5*  --   --   HGB 14.0 14.9 14.6 13.5 14.4  HCT 43.7 46.9* 44.6 41.5 43.4  MCV 90.9 89.8 91.2 89.2 88.9  PLT 311 290 230 230 270   Cardiac Enzymes:  Recent Labs Lab 02/12/14 2126 02/13/14 0130 02/13/14 0720  TROPONINI <0.30 <0.30 <0.30   BNP (last 3 results)  Recent Labs  01/29/14 2030 02/12/14 1604  PROBNP 1482.0* 1430.0*   CBG:  Recent Labs Lab 02/18/14 1113 02/18/14 1614 02/18/14 2201 02/19/14 0557 02/19/14 1056  GLUCAP 130* 110* 119* 97 118*    Recent Results (from the past 240 hour(s))  MRSA PCR SCREENING     Status: None   Collection Time    02/12/14  7:53 PM      Result Value Ref Range Status   MRSA by PCR NEGATIVE  NEGATIVE Final   Comment:            The GeneXpert MRSA Assay (FDA     approved for NASAL specimens     only), is one component of a     comprehensive MRSA colonization     surveillance program. It is not     intended to diagnose MRSA     infection nor to guide or     monitor treatment for     MRSA infections.  CULTURE, BLOOD (ROUTINE X 2)     Status: None   Collection Time    02/16/14 12:19 AM      Result Value Ref Range Status   Specimen Description BLOOD RIGHT ARM   Final   Special Requests BOTTLES DRAWN AEROBIC AND ANAEROBIC [facility]   Final   Culture  Setup Time     Final   Value: 02/16/2014 08:27     Performed at [facility]   Culture     Final   Value:        BLOOD CULTURE RECEIVED NO GROWTH TO DATE CULTURE WILL BE HELD FOR 5 DAYS BEFORE ISSUING A FINAL NEGATIVE REPORT     Performed at [facility]   Report Status PENDING   Incomplete  CULTURE, BLOOD (ROUTINE X 2)     Status: None   Collection Time    02/16/14 12:29 AM      Result Value Ref Range Status   Specimen Description BLOOD RIGHT FOREARM   Final   Special  Requests BOTTLES DRAWN AEROBIC ONLY 6CC   Final   Culture  Setup Time     Final   Value: 02/16/2014 08:28     Performed at [facility]   Culture     Final   Value:        BLOOD CULTURE RECEIVED NO GROWTH TO DATE CULTURE WILL BE HELD FOR 5 DAYS BEFORE ISSUING A FINAL NEGATIVE REPORT     Performed at [facility]   Report Status PENDING   Incomplete     Studies: No results found.  Scheduled Meds: . carvedilol  6.25 mg Oral  BID WC  . feeding supplement (GLUCERNA SHAKE)  237 mL Oral Q24H  . furosemide  40 mg Oral Daily  . insulin aspart  0-9 Units Subcutaneous TID WC  . multivitamin with minerals  1 tablet Oral Daily  . polyvinyl alcohol  1 drop Both Eyes BID  . sodium chloride  3 mL Intravenous Q12H  . traZODone  50 mg Oral QHS   Continuous Infusions:   Principal Problem:   Fever Active Problems:   Sinus bradycardia   DM (diabetes mellitus)   Hypertension   Diastolic CHF   Pacemaker-Medtronic   Hyperkalemia    Time spent: 25 min    Kelvin Cellar  [facility] Pager [phone]. If 7PM-7AM, please contact night-coverage at [email or web address], password [facility] 02/19/2014, 1:18 PM  LOS: 4 days

## 2014-02-19 NOTE — Progress Notes (Signed)
   ELECTROPHYSIOLOGY ROUNDING NOTE    Patient Name: Heather [name] Date of Encounter: 02/19/2014    SUBJECTIVE:Feels improved this morning. No chest pain or shortness of breath.  Still with decreased appetite.  Ambulated yesterday without difficulty  TELEMETRY: Reviewed telemetry pt in sinus rhythm with occasional atrial pacing Filed Vitals:   02/18/14 2202 02/18/14 2207 02/19/14 0657 02/19/14 0659  BP: 129/54  137/58   Pulse:   70   Temp: 98.7 F (37.1 C)  97.4 F (36.3 C)   TempSrc: Oral  Oral   Resp: 20 16 18    Height:      Weight:    157 lb 4.8 oz (71.351 kg)  SpO2: 98% 98% 96%     Intake/Output Summary (Last 24 hours) at 02/19/14 0728 Last data filed at 02/19/14 0658  Gross per 24 hour  Intake    240 ml  Output   1250 ml  Net  -1010 ml    CURRENT MEDICATIONS: . carvedilol  6.25 mg Oral BID WC  . feeding supplement (GLUCERNA SHAKE)  237 mL Oral Q24H  . furosemide  40 mg Oral Daily  . insulin aspart  0-9 Units Subcutaneous TID WC  . multivitamin with minerals  1 tablet Oral Daily  . polyvinyl alcohol  1 drop Both Eyes BID  . sodium chloride  3 mL Intravenous Q12H    LABS: Basic Metabolic Panel:  Recent Labs  02/18/14 0414 02/19/14 0355  NA 134* 137  K 4.9 5.1  CL 98 100  CO2 23 24  GLUCOSE 102* 102*  BUN 55* 54*  CREATININE 1.88* 1.73*  CALCIUM 9.1 9.2   CBC:  Recent Labs  02/17/14 1128  WBC 10.1  HGB 13.5  HCT 41.5  MCV 89.2  PLT 230    Radiology/Studies:  Dg Chest 2 View 02/16/2014   CLINICAL DATA:  Weakness.  History of Crohn's disease.  EXAM: CHEST  2 VIEW  COMPARISON:  01/19/2014  FINDINGS: Cardiac pacemaker is unchanged. The heart size and mediastinal contours are within normal limits. Both lungs are clear. The visualized skeletal structures are unremarkable.  IMPRESSION: No active cardiopulmonary disease.   Electronically Signed   By: Lucienne Capers M.D.   On: 02/16/2014 00:08    PHYSICAL EXAM Well developed and nourished in no acute  distress HENT normal Neck supple with JVP-flat Clear Regular rate and rhythm, no murmurs or gallops Abd-soft with active BS No Clubbing cyanosis edema Skin-warm and dry A & Oriented  Grossly normal sensory and motor function   Principal Problem:   Fever Active Problems:   Sinus bradycardia   DM (diabetes mellitus)   Hypertension   Diastolic CHF   Pacemaker-Medtronic   Hyperkalemia   Dysgeusia  No evidence of infection Will defer to ID but would wonder about folowing 48hrs off aBx and then the question will be how do we do and maintain surveillance post discharge

## 2014-02-20 LAB — BASIC METABOLIC PANEL
Anion gap: 15 (ref 5–15)
BUN: 58 mg/dL — ABNORMAL HIGH (ref 6–23)
CO2: 22 mEq/L (ref 19–32)
Calcium: 9.2 mg/dL (ref 8.4–10.5)
Chloride: 97 mEq/L (ref 96–112)
Creatinine, Ser: 1.62 mg/dL — ABNORMAL HIGH (ref 0.50–1.10)
GFR calc Af Amer: 35 mL/min — ABNORMAL LOW (ref >90)
GFR calc non Af Amer: 30 mL/min — ABNORMAL LOW (ref >90)
Glucose, Bld: 105 mg/dL — ABNORMAL HIGH (ref 70–99)
Potassium: 5.2 mEq/L (ref 3.7–5.3)
Sodium: 134 mEq/L — ABNORMAL LOW (ref 137–147)

## 2014-02-20 LAB — GLUCOSE, CAPILLARY: Glucose-Capillary: 114 mg/dL — ABNORMAL HIGH (ref 70–99)

## 2014-02-20 MED ORDER — GLIPIZIDE 5 MG PO TABS: 2.5000 mg | ORAL_TABLET | Freq: Every day | ORAL | Status: DC

## 2014-02-20 MED ORDER — TRAZODONE HCL 50 MG PO TABS: 50.0000 mg | ORAL_TABLET | Freq: Every day | ORAL | Status: DC

## 2014-02-20 NOTE — Progress Notes (Signed)
Patient and family given discharge instructions and all questions answered.  Patient discharged via wheelchair with all belongings.

## 2014-02-20 NOTE — Care Management Note (Signed)
    Page 1 of 1   02/20/2014     11:45:39 AM CARE MANAGEMENT NOTE 02/20/2014  Patient:  Heather [name], Heather [name]   Account Number:  [id number]  Date Initiated:  02/20/2014  Documentation initiated by:  Fuller Mandril  Subjective/Objective Assessment:   73 yo female s/p pacemaker placement 2 days ago, comes in with fever over 102.// Home with daughter.     Action/Plan:   IV abx.//Access for [facility] services   Anticipated DC Date:  02/20/2014   Anticipated DC Plan:  HOME/SELF CARE      DC Planning Services  CM consult      Choice offered to / List presented to:  C-4 Adult Children        HH arranged  [place] - 11 Patient Refused      Status of service:  Completed, signed off Medicare Important Message given?   (If response is "NO", the following Medicare IM given date fields will be blank) Date Medicare IM given:   Medicare IM given by:   Date Additional Medicare IM given:   Additional Medicare IM given by:    Discharge Disposition:  HOME/SELF CARE  Per UR Regulation:  Reviewed for med. necessity/level of care/duration of stay  If discussed at [place] of Stay Meetings, dates discussed:    Comments:  02/20/14 [place], RN, BSN, [place] [phone] I have recommended that this patient have [place] but she declines at this time. I have discussed the risks and benefits of this service with her. The patient verbalizes understanding.

## 2014-02-20 NOTE — Discharge Summary (Addendum)
Physician Discharge Summary  Heather [name]:[id number] DOB: [birth year] DOA: 02/15/2014  PCP: No PCP Per Patient  Admit date: 02/15/2014 Discharge date: 02/20/2014  Time spent: 35 minutes  Recommendations for Outpatient Follow-up:  1. Please repeat CBC and BMP on hospital follow up 2. Follow up on blood sugars    Discharge Diagnoses:  Principal Problem:   Fever Active Problems:   Sinus bradycardia   DM (diabetes mellitus)   Hypertension   Diastolic CHF   Pacemaker-Medtronic   Hyperkalemia SIRS, patient presenting with 2 criteria Temp of 102 and white count of 20,000 without a known focus of infection  Discharge Condition: Stable  Diet recommendation: Heart Healthy  Filed Weights   02/18/14 0500 02/19/14 0659 02/20/14 0536  Weight: 71.8 kg (158 lb 4.6 oz) 71.351 kg (157 lb 4.8 oz) 71.169 kg (156 lb 14.4 oz)    History of present illness:  73 yo female s/p pacemaker placement 2 days ago, comes in with fever over 102. No cough. No n/v/d. No rashes. No abd pain, no cp. No pain around surgical site unless palpate it. No drainage from site. No dysuria. No uri symptoms or no sick contacts. ua and cxr are clean. Concern was that her pacemaker site is getting infected. Cardiology has been called and will see in am. One dose of vancomycin has been given.  Hospital Course:  Patient is a pleasant 73 year old female with a past medical history of diastolic congestive heart failure, sinus node this function with bradycardia, he was receiving mid to the medicine service on 01/29/2014, discharged on 02/01/2014 at which time she was found to be bradycardic, felt to be as a consequence of digoxin toxicity. She was readmitted on 02/12/2014 presenting with generalized weakness, fatigue, found to be bradycardic have a heart rate of 30. During that hospitalization she underwent dual-chamber pacemaker implantation on 02/14/2014. She was discharged from the cardiology service on 02/15/2014. She was  readmitted on 02/16/2014 t, presenting with complaints of fever, having a temperature 102 at home. Initial labs revealed significant upward trend in her white count from 10,800 on 02/14/2014 to 20,000 on 02/16/2014. Patient was seen and evaluated by infectious disease, started on broad-spectrum empiric IV antibiotic therapy with cefepime and vancomycin. Chest x-ray did not reveal active cardiopulmonary disease, urinalysis was unremarkable. Pacer pocket infection was suspected although there was not obvious clinical evidence of infection.  IV antibiotic therapy was discontinued on 02/18/2014. She was monitored for 48 hours and remained afebrile. Infectious disease recommending observation off on antimicrobial therapy and will be followed up in the office. Patient discharged stable condition on 02/20/2014.  Consultations:  Infectious disease  Cardiology  Discharge Exam: Filed Vitals:   02/20/14 0657  BP: 149/69  Pulse:   Temp:   Resp:     General: Patient is in no acute distress she is awake alert and oriented x3, states feeling better Cardiovascular: Regular rate and rhythm normal S1-S2, no extremity edema Respiratory: Normal respiratory effort, lungs are clear to auscultation bilaterally Abdomen: Soft nontender non-distended Extremity: No edema  Discharge Instructions You were cared for by a hospitalist during your hospital stay. If you have any questions about your discharge medications or the care you received while you were in the hospital after you are discharged, you can call the unit and asked to speak with the hospitalist on call if the hospitalist that took care of you is not available. Once you are discharged, your primary care physician will handle any further medical issues. Please note  that NO REFILLS for any discharge medications will be authorized once you are discharged, as it is imperative that you return to your primary care physician (or establish a relationship with a  primary care physician if you do not have one) for your aftercare needs so that they can reassess your need for medications and monitor your lab values.  Discharge Instructions   (HEART FAILURE PATIENTS) Call MD:  Anytime you have any of the following symptoms: 1) 3 pound weight gain in 24 hours or 5 pounds in 1 week 2) shortness of breath, with or without a dry hacking cough 3) swelling in the hands, feet or stomach 4) if you have to sleep on extra pillows at night in order to breathe.    Complete by:  As directed      Call MD for:  difficulty breathing, headache or visual disturbances    Complete by:  As directed      Call MD for:  extreme fatigue    Complete by:  As directed      Call MD for:  hives    Complete by:  As directed      Call MD for:  persistant dizziness or light-headedness    Complete by:  As directed      Call MD for:  persistant nausea and vomiting    Complete by:  As directed      Call MD for:  redness, tenderness, or signs of infection (pain, swelling, redness, odor or green/yellow discharge around incision site)    Complete by:  As directed      Call MD for:  severe uncontrolled pain    Complete by:  As directed      Call MD for:  temperature >100.4    Complete by:  As directed      Diet - low sodium heart healthy    Complete by:  As directed      Increase activity slowly    Complete by:  As directed             Medication List    STOP taking these medications       metFORMIN 850 MG tablet  Commonly known as:  GLUCOPHAGE      TAKE these medications       aspirin 81 MG EC tablet  Take 1 tablet (81 mg total) by mouth daily.     carvedilol 6.25 MG tablet  Commonly known as:  COREG  Take 1 tablet (6.25 mg total) by mouth 2 (two) times daily with a meal.     furosemide 40 MG tablet  Commonly known as:  LASIX  Take 1 tablet (40 mg total) by mouth daily.     glipiZIDE 5 MG tablet  Commonly known as:  GLUCOTROL  Take 0.5 tablets (2.5 mg total) by  mouth daily before breakfast.     losartan 50 MG tablet  Commonly known as:  COZAAR  Take 1 tablet (50 mg total) by mouth daily.     omeprazole 20 MG capsule  Commonly known as:  PRILOSEC  Take 20 mg by mouth 2 (two) times daily.     traZODone 50 MG tablet  Commonly known as:  DESYREL  Take 1 tablet (50 mg total) by mouth at bedtime.       No Known Allergies     Follow-up Information   Follow up with No PCP Per Patient.   Specialty:  General Practice      Follow  up with Virl Axe, MD In 1 week.   Specialty:  Cardiology   Contact information:   [address] [address] [phone]       Follow up with Michel Bickers, MD In 1 week.   Specialty:  Infectious Diseases   Contact information:   [address]  [place] [address] [phone]        The results of significant diagnostics from this hospitalization (including imaging, microbiology, ancillary and laboratory) are listed below for reference.    Significant Diagnostic Studies: Dg Chest 2 View  02/16/2014   CLINICAL DATA:  Weakness.  History of Crohn's disease.  EXAM: CHEST  2 VIEW  COMPARISON:  01/19/2014  FINDINGS: Cardiac pacemaker is unchanged. The heart size and mediastinal contours are within normal limits. Both lungs are clear. The visualized skeletal structures are unremarkable.  IMPRESSION: No active cardiopulmonary disease.   Electronically Signed   By: Lucienne Capers M.D.   On: 02/16/2014 00:08   Dg Chest 2 View  02/15/2014   CLINICAL DATA:  Pacemaker placement.  EXAM: CHEST  2 VIEW  COMPARISON:  One-view chest 02/12/2014.  FINDINGS: The heart is mildly enlarged. There is no edema or effusion to suggest failure. Minimal bibasilar atelectasis is evident. A dual-chamber pacemaker is in place via a left subclavian approach. The leads are in the right atrium and right ventricle. Mild curvature is again noted within the upper lumbar spine. The visualized soft  tissues and bony thorax are otherwise unremarkable.  IMPRESSION: 1. Interval placement of dual lead pacemaker without radiographic evidence for complication. 2. Minimal bibasilar atelectasis. 3. Borderline cardiomegaly without failure.   Electronically Signed   By: Lawrence Santiago M.D.   On: 02/15/2014 07:48   Dg Chest 2 View  01/31/2014   CLINICAL DATA:  Shortness of breath  EXAM: CHEST  2 VIEW  COMPARISON:  01/29/2014  FINDINGS: Mild cardiac enlargement is stable. Vascular pattern is normal. No consolidation or effusion.  IMPRESSION: No acute abnormalities.   Electronically Signed   By: Skipper Cliche M.D.   On: 01/31/2014 22:14   US Renal  01/30/2014   CLINICAL DATA:  Elevated creatinine, evaluate for kidney disease. History of diabetes and hypertension.  EXAM: RENAL/URINARY TRACT ULTRASOUND COMPLETE  COMPARISON:  None.  FINDINGS: Right Kidney:  Length: 10.8 cm. Echogenicity within normal limits. No mass or hydronephrosis visualized.  Left Kidney:  Length: 10.3 cm. Echogenicity within normal limits. No hydronephrosis visualized. 2 cm cyst over the upper pole.  Bladder:  Appears normal for degree of bladder distention.  IMPRESSION: Normal size kidneys without evidence of hydronephrosis.  2 cm left upper pole renal cyst.   Electronically Signed   By: Marin Olp M.D.   On: 01/30/2014 09:41   Dg Chest Port 1 View  02/12/2014   CLINICAL DATA:  Dizziness and abdominal pain. Chest pain. Headache.  EXAM: PORTABLE CHEST - 1 VIEW  COMPARISON:  Two-view chest 01/31/2014  FINDINGS: The heart size is mildly enlarged. There is no edema or effusion to suggest failure. Costophrenic angles are sharp. Defibrillator pads are in place. No focal airspace disease is present. The visualized soft tissues and bony thorax are unremarkable.  IMPRESSION: 1. Mild cardiomegaly without failure. 2. No acute cardiopulmonary disease.   Electronically Signed   By: Lawrence Santiago M.D.   On: 02/12/2014 16:33   Dg Chest Portable 1  View  01/29/2014   CLINICAL DATA:  Bradycardia. Chest pain. Hypertension. Heart  failure.  EXAM: PORTABLE CHEST - 1 VIEW  COMPARISON:  None.  FINDINGS: Mildly enlarged cardiopericardial silhouette. Atherosclerotic aortic arch.  No overt edema. Slight pleural thickening at the right lung apex, likely benign/incidental.  Degenerative AC joint arthropathy bilaterally.  IMPRESSION: 1. Mildly enlarged cardiopericardial silhouette, without edema. 2. Atherosclerotic aortic arch.   Electronically Signed   By: Sherryl Barters M.D.   On: 01/29/2014 21:35    Microbiology: Recent Results (from the past 240 hour(s))  MRSA PCR SCREENING     Status: None   Collection Time    02/12/14  7:53 PM      Result Value Ref Range Status   MRSA by PCR NEGATIVE  NEGATIVE Final   Comment:            The GeneXpert MRSA Assay (FDA     approved for NASAL specimens     only), is one component of a     comprehensive MRSA colonization     surveillance program. It is not     intended to diagnose MRSA     infection nor to guide or     monitor treatment for     MRSA infections.  CULTURE, BLOOD (ROUTINE X 2)     Status: None   Collection Time    02/16/14 12:19 AM      Result Value Ref Range Status   Specimen Description BLOOD RIGHT ARM   Final   Special Requests BOTTLES DRAWN AEROBIC AND ANAEROBIC 6CC   Final   Culture  Setup Time     Final   Value: 02/16/2014 08:27     Performed at [facility]   Culture     Final   Value:        BLOOD CULTURE RECEIVED NO GROWTH TO DATE CULTURE WILL BE HELD FOR 5 DAYS BEFORE ISSUING A FINAL NEGATIVE REPORT     Performed at [facility]   Report Status PENDING   Incomplete  CULTURE, BLOOD (ROUTINE X 2)     Status: None   Collection Time    02/16/14 12:29 AM      Result Value Ref Range Status   Specimen Description BLOOD RIGHT FOREARM   Final   Special Requests BOTTLES DRAWN AEROBIC ONLY 6CC   Final   Culture  Setup Time     Final   Value: 02/16/2014 08:28      Performed at [facility]   Culture     Final   Value:        BLOOD CULTURE RECEIVED NO GROWTH TO DATE CULTURE WILL BE HELD FOR 5 DAYS BEFORE ISSUING A FINAL NEGATIVE REPORT     Performed at [facility]   Report Status PENDING   Incomplete     Labs: Basic Metabolic Panel:  Recent Labs Lab 02/16/14 0029 02/17/14 1128 02/18/14 0414 02/19/14 0355 02/20/14 0528  NA 135* 134* 134* 137 134*  K 5.4* 4.7 4.9 5.1 5.2  CL 104 100 98 100 97  CO2 15* 22 23 24 22   GLUCOSE 124* 157* 102* 102* 105*  BUN 48* 50* 55* 54* 58*  CREATININE 1.77* 1.79* 1.88* 1.73* 1.62*  CALCIUM 9.2 9.2 9.1 9.2 9.2   Liver Function Tests: No results found for this basename: AST, ALT, ALKPHOS, BILITOT, PROT, ALBUMIN,  in the last 168 hours No results found for this basename: LIPASE, AMYLASE,  in the last 168 hours No results found for this basename: AMMONIA,  in the last  168 hours CBC:  Recent Labs Lab 02/14/14 0306 02/16/14 0029 02/17/14 1128 02/19/14 1200  WBC 10.8* 20.0* 10.1 11.1*  NEUTROABS  --  17.5*  --   --   HGB 14.9 14.6 13.5 14.4  HCT 46.9* 44.6 41.5 43.4  MCV 89.8 91.2 89.2 88.9  PLT 290 230 230 270   Cardiac Enzymes: No results found for this basename: CKTOTAL, CKMB, CKMBINDEX, TROPONINI,  in the last 168 hours BNP: BNP (last 3 results)  Recent Labs  01/29/14 2030 02/12/14 1604  PROBNP 1482.0* 1430.0*   CBG:  Recent Labs Lab 02/19/14 0557 02/19/14 1056 02/19/14 1703 02/19/14 2051 02/20/14 0541  GLUCAP 97 118* 132* 109* 114*       Signed:  Shemika Robbs  [facility] 02/20/2014, 9:43 AM

## 2014-02-22 LAB — CULTURE, BLOOD (ROUTINE X 2)
Culture: NO GROWTH
Culture: NO GROWTH

## 2014-02-26 ENCOUNTER — Ambulatory Visit (INDEPENDENT_AMBULATORY_CARE_PROVIDER_SITE_OTHER): Payer: PRIVATE HEALTH INSURANCE | Admitting: *Deleted

## 2014-02-26 DIAGNOSIS — I498 Other specified cardiac arrhythmias: Secondary | ICD-10-CM

## 2014-02-26 DIAGNOSIS — R001 Bradycardia, unspecified: Secondary | ICD-10-CM

## 2014-02-26 LAB — MDC_IDC_ENUM_SESS_TYPE_INCLINIC
Battery Voltage: 3.09 V
Brady Statistic AP VP Percent: 0.04 %
Brady Statistic AP VS Percent: 99.88 %
Brady Statistic AS VP Percent: 0 %
Brady Statistic AS VS Percent: 0.07 %
Brady Statistic RA Percent Paced: 99.93 %
Brady Statistic RV Percent Paced: 0.04 %
Date Time Interrogation Session: 20150810164553
Lead Channel Impedance Value: 380 Ohm
Lead Channel Impedance Value: 513 Ohm
Lead Channel Impedance Value: 532 Ohm
Lead Channel Impedance Value: 627 Ohm
Lead Channel Pacing Threshold Amplitude: 0.75 V
Lead Channel Pacing Threshold Amplitude: 0.875 V
Lead Channel Pacing Threshold Pulse Width: 0.4 ms
Lead Channel Pacing Threshold Pulse Width: 0.4 ms
Lead Channel Sensing Intrinsic Amplitude: 10.125 mV
Lead Channel Sensing Intrinsic Amplitude: 10.25 mV
Lead Channel Sensing Intrinsic Amplitude: 2.625 mV
Lead Channel Sensing Intrinsic Amplitude: 3.5 mV
Lead Channel Setting Pacing Amplitude: 3.5 V
Lead Channel Setting Pacing Amplitude: 3.5 V
Lead Channel Setting Pacing Pulse Width: 0.4 ms
Lead Channel Setting Sensing Sensitivity: 2 mV
Zone Setting Detection Interval: 400 ms
Zone Setting Detection Interval: 400 ms

## 2014-02-26 NOTE — Progress Notes (Signed)
Wound check appointment. No steri-strips, dermabond present. Wound without redness or edema. Incision edges approximated, wound well healed. Normal device function. Thresholds, sensing, and impedances consistent with implant measurements. Device programmed at 3.5V/auto capture programmed on for extra safety margin until 3 month visit. Histogram distribution appropriate for patient and level of activity. No mode switches or high ventricular rates noted. Patient educated about wound care, arm mobility, lifting restrictions. ROV w/ Dr. Caryl Comes 05/22/14.

## 2014-03-12 ENCOUNTER — Ambulatory Visit (INDEPENDENT_AMBULATORY_CARE_PROVIDER_SITE_OTHER): Payer: Self-pay | Admitting: Cardiology

## 2014-03-12 ENCOUNTER — Telehealth: Payer: Self-pay | Admitting: *Deleted

## 2014-03-12 ENCOUNTER — Encounter: Payer: Self-pay | Admitting: Cardiology

## 2014-03-12 VITALS — BP 112/64 | HR 90 | Ht 62.0 in | Wt 154.0 lb

## 2014-03-12 DIAGNOSIS — I359 Nonrheumatic aortic valve disorder, unspecified: Secondary | ICD-10-CM

## 2014-03-12 DIAGNOSIS — I509 Heart failure, unspecified: Secondary | ICD-10-CM

## 2014-03-12 DIAGNOSIS — I5033 Acute on chronic diastolic (congestive) heart failure: Secondary | ICD-10-CM

## 2014-03-12 DIAGNOSIS — N289 Disorder of kidney and ureter, unspecified: Secondary | ICD-10-CM

## 2014-03-12 DIAGNOSIS — I2510 Atherosclerotic heart disease of native coronary artery without angina pectoris: Secondary | ICD-10-CM

## 2014-03-12 DIAGNOSIS — I10 Essential (primary) hypertension: Secondary | ICD-10-CM

## 2014-03-12 DIAGNOSIS — I350 Nonrheumatic aortic (valve) stenosis: Secondary | ICD-10-CM

## 2014-03-12 LAB — CBC WITH DIFFERENTIAL/PLATELET
Basophils Absolute: 0.1 10*3/uL (ref 0.0–0.1)
Basophils Relative: 1.3 % (ref 0.0–3.0)
Eosinophils Absolute: 0.1 10*3/uL (ref 0.0–0.7)
Eosinophils Relative: 1.7 % (ref 0.0–5.0)
HCT: 43.7 % (ref 36.0–46.0)
Hemoglobin: 14.3 g/dL (ref 12.0–15.0)
Lymphocytes Relative: 24.5 % (ref 12.0–46.0)
Lymphs Abs: 1.9 10*3/uL (ref 0.7–4.0)
MCHC: 32.8 g/dL (ref 30.0–36.0)
MCV: 88.9 fl (ref 78.0–100.0)
Monocytes Absolute: 0.6 10*3/uL (ref 0.1–1.0)
Monocytes Relative: 7 % (ref 3.0–12.0)
Neutro Abs: 5.2 10*3/uL (ref 1.4–7.7)
Neutrophils Relative %: 65.5 % (ref 43.0–77.0)
Platelets: 259 10*3/uL (ref 150.0–400.0)
RBC: 4.91 Mil/uL (ref 3.87–5.11)
RDW: 14.7 % (ref 11.5–15.5)
WBC: 7.9 10*3/uL (ref 4.0–10.5)

## 2014-03-12 LAB — BASIC METABOLIC PANEL
BUN: 67 mg/dL — ABNORMAL HIGH (ref 6–23)
CO2: 26 mEq/L (ref 19–32)
Calcium: 9.8 mg/dL (ref 8.4–10.5)
Chloride: 102 mEq/L (ref 96–112)
Creatinine, Ser: 2.3 mg/dL — ABNORMAL HIGH (ref 0.4–1.2)
GFR: 22.58 mL/min — ABNORMAL LOW (ref >60.00)
Glucose, Bld: 106 mg/dL — ABNORMAL HIGH (ref 70–99)
Potassium: 5.1 mEq/L (ref 3.5–5.1)
Sodium: 136 mEq/L (ref 135–145)

## 2014-03-12 NOTE — Assessment & Plan Note (Signed)
The patient is on furosemide 40 mg daily.  She is not having any symptoms of CHF.  We are checking a basal metabolic panel today.

## 2014-03-12 NOTE — Progress Notes (Signed)
Heather [name] Date of Birth:  30-Dec-1940 [facility] [address] [place], [place]  [address] [phone]        Fax   [phone]   History of Present Illness: This pleasant 73 year old woman from [place] is seen for a office visit.  We had previously seen her at the hospital.  She presented on 01/29/14 with weakness and junctional bradycardia and was found to be digitalis toxic with a digoxin level of 1.9.  Her digoxin was stopped.  Her heart rate gradually improved and she was discharged home.  For her high blood pressure she was on carvedilol.  She had an echocardiogram on 01/30/14 which showed an ejection fraction of 01-75%, grade 2 diastolic dysfunction, moderate aortic stenosis, and enlarged left atrium at 57 mm. The patient was readmitted on 02/12/14 with marked sinus bradycardia.  Her heart rate was in the 30s and she required a temporary pacing wire.  Her carvedilol was stopped but her heart rate did not improve.  Therefore on 02/14/14 she underwent placement of a Medtronic dual-chamber pacemaker by Dr. Caryl Comes. Since her pacemaker she has felt well.  Current Outpatient Prescriptions  Medication Sig Dispense Refill  . aspirin EC 81 MG EC tablet Take 1 tablet (81 mg total) by mouth daily.  30 tablet  0  . carvedilol (COREG) 6.25 MG tablet Take 1 tablet (6.25 mg total) by mouth 2 (two) times daily with a meal.  60 tablet  3  . furosemide (LASIX) 40 MG tablet Take 1 tablet (40 mg total) by mouth daily.  30 tablet  3  . glipiZIDE (GLUCOTROL) 5 MG tablet Take 2.5 mg by mouth as needed.      Marland Kitchen losartan (COZAAR) 50 MG tablet Take 1 tablet (50 mg total) by mouth daily.  30 tablet  0  . omeprazole (PRILOSEC) 20 MG capsule Take 20 mg by mouth 2 (two) times daily.       . traZODone (DESYREL) 50 MG tablet Take 1 tablet (50 mg total) by mouth at bedtime.  30 tablet  1   No current facility-administered medications for this visit.    No Known  Allergies  Patient Active Problem List   Diagnosis Date Noted  . Hyperkalemia 02/17/2014  . Fever 02/16/2014  . 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 Crohn's disease 02/16/2014  . Sinus bradycardia 01/29/2014  . DM (diabetes mellitus) 01/29/2014  . OSA on CPAP 01/29/2014  . Pulmonary hypertension 01/29/2014  . CAD (coronary artery disease) 01/29/2014  . Hypertension 01/29/2014  . Diastolic CHF 05/13/8526    History  Smoking status  . Never Smoker   Smokeless tobacco  . Never Used    History  Alcohol Use No    Family History  Problem Relation Age of Onset  . Adopted: Yes    Review of Systems: Constitutional: no fever chills diaphoresis or fatigue or change in weight.  Head and neck: no hearing loss, no epistaxis, no photophobia or visual disturbance. Respiratory: No cough, shortness of breath or wheezing. Cardiovascular: No chest pain peripheral edema, palpitations. Gastrointestinal: No abdominal distention, no abdominal pain, no change in bowel habits hematochezia or melena. Genitourinary: No dysuria, no frequency, no urgency, no nocturia. Musculoskeletal:No arthralgias, no back pain, no gait disturbance or myalgias. Neurological: No dizziness, no headaches, no numbness, no seizures, no syncope, no weakness, no tremors. Hematologic: No lymphadenopathy, no easy bruising. Psychiatric: No confusion, no hallucinations, no sleep disturbance.    Physical Exam: Filed Vitals:  03/12/14 0925  BP: 112/64  Pulse: 90   the general appearance reveals a well-developed well-nourished middle-aged woman in no distress.The head and neck exam reveals pupils equal and reactive.  Extraocular movements are full.  There is no scleral icterus.  The mouth and pharynx are normal.  The neck is supple.  The carotids reveal no bruits.  The jugular venous pressure is normal.  The  thyroid is not enlarged.  There is no lymphadenopathy.  The chest is clear to percussion and auscultation.  There are  no rales or rhonchi.  Expansion of the chest is symmetrical.  The pacemaker pocket in the left upper quadrant appears to be healing well.  No evidence of infection.  The precordium is quiet.  The first heart sound is normal.  The second heart sound is physiologically split.  There is no murmur gallop rub or click.  There is no abnormal lift or heave.  The abdomen is soft and nontender.  The bowel sounds are normal.  The liver and spleen are not enlarged.  There are no abdominal masses.  There are no abdominal bruits.  Extremities reveal good pedal pulses.  There is no phlebitis or edema.  There is no cyanosis or clubbing.  Strength is normal and symmetrical in all extremities.  There is no lateralizing weakness.  There are no sensory deficits.  The skin is warm and dry.  There is no rash.    Assessment / Plan: 1. sick sinus syndrome with symptomatic sinus bradycardia, now with functioning dual-chamber pacemaker inserted 02/14/14 (Medtronic) 2. diabetes mellitus 3. essential hypertension 4. chronic diastolic heart failure  Plan: Continue same medication.  We are checking a CBC and a basal metabolic panel today. She is in the process of obtaining a primary care provider.  She does not have one yet.

## 2014-03-12 NOTE — Patient Instructions (Signed)
Will obtain labs today and call you with the results (cbc/bmet)  Your physician recommends that you continue on your current medications as directed. Please refer to the Current Medication list given to you today.  Your physician recommends that you schedule a follow-up appointment in: 4 month ov/ekg

## 2014-03-12 NOTE — Assessment & Plan Note (Signed)
The patient has not been having any chest pain or angina pectoris

## 2014-03-12 NOTE — Telephone Encounter (Signed)
Message copied by Earvin Hansen on Mon Mar 12, 2014  5:21 PM ------      Message from: Darlin Coco      Created: Mon Mar 12, 2014  1:40 PM       Please report.  The white count is normal now.  No sign of infection.  No anemia.      The kidney function is not as good.  The kidneys are too dry.  Hold Lasix for one day and then resume at a lower dose of just 20 mg daily.  Recheck another basal metabolic panel in one week.  Drink plenty of water. ------

## 2014-03-12 NOTE — Telephone Encounter (Signed)
Advised daughter of change and follow up labs, verbalized understanding.

## 2014-03-12 NOTE — Assessment & Plan Note (Signed)
The patient is not having any headaches or dizziness.  No palpitations.  No peripheral edema.

## 2014-03-12 NOTE — Assessment & Plan Note (Signed)
The patient has a history of diabetes.  She is not having any hypoglycemic episodes.  She checks her blood sugar each morning.  She only takes her glipizide if her blood sugar is elevated.  Recently her blood sugars have been normal she has not had to take any glipizide.

## 2014-03-14 ENCOUNTER — Encounter: Payer: Self-pay | Admitting: Internal Medicine

## 2014-03-14 ENCOUNTER — Ambulatory Visit (INDEPENDENT_AMBULATORY_CARE_PROVIDER_SITE_OTHER): Payer: Self-pay | Admitting: Internal Medicine

## 2014-03-14 VITALS — BP 108/72 | HR 93 | Temp 98.4°F | Ht 62.0 in | Wt 155.0 lb

## 2014-03-14 DIAGNOSIS — Z23 Encounter for immunization: Secondary | ICD-10-CM

## 2014-03-14 NOTE — Progress Notes (Signed)
Patient ID: Heather [name], female   DOB: 1941-06-15, 73 y.o.   MRN: [id number]         [facility]  Patient Active Problem List   Diagnosis Date Noted  . Fever 02/16/2014    Priority: High  . 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    Priority: Medium  . Hyperkalemia 02/17/2014  . Crohn's disease 02/16/2014  . Sinus bradycardia 01/29/2014  . Type II or unspecified type diabetes mellitus without mention of complication, uncontrolled 01/29/2014  . OSA on CPAP 01/29/2014  . Pulmonary hypertension 01/29/2014  . CAD (coronary artery disease) 01/29/2014  . Hypertension 01/29/2014  . Diastolic CHF 27/74/1287    Patient's Medications  New Prescriptions   No medications on file  Previous Medications   ASPIRIN EC 81 MG EC TABLET    Take 1 tablet (81 mg total) by mouth daily.   CARVEDILOL (COREG) 6.25 MG TABLET    Take 1 tablet (6.25 mg total) by mouth 2 (two) times daily with a meal.   FUROSEMIDE (LASIX) 40 MG TABLET    Take 40 mg by mouth as directed. 1/2 tablet daily   GLIPIZIDE (GLUCOTROL) 5 MG TABLET    Take 2.5 mg by mouth as needed.   LOSARTAN (COZAAR) 50 MG TABLET    Take 1 tablet (50 mg total) by mouth daily.   OMEPRAZOLE (PRILOSEC) 20 MG CAPSULE    Take 20 mg by mouth 2 (two) times daily.    TRAZODONE (DESYREL) 50 MG TABLET    Take 1 tablet (50 mg total) by mouth at bedtime.  Modified Medications   No medications on file  Discontinued Medications   No medications on file    Subjective: Heather [name] is a 73 y.o. female native of [place] who immigrated here 10 months ago. She has diastolic heart failure and sinus node dysfunction with bradycardia. She also has aortic stenosis. She was hospitalized from July of 13-16 with digoxin toxicity. She was rehospitalized on July 27 with bradycardia and fatigue. She had a temporary pacemaker inserted through her right groin then had a permanent pacemaker placed on July 29. She was readmitted one day later after  developing a temperature to 101.3 and a white count of 20,000. Blood cultures were obtained and she was started on empiric antibiotics. She had no further fevers and her blood cultures were negative so antibiotics were stopped and she was discharged home. She's not had any fever since discharge. She is feeling much better with increased energy since the pacemaker was placed.    Review of Systems: Pertinent items are noted in HPI.  Past Medical History  Diagnosis Date  . CHF (congestive heart failure)   . Hypertension   . Murmur, cardiac   . Pulmonary hypertension   . Crohn's disease   . 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 Sinus bradycardia 01/29/2014  . High cholesterol   . OSA on CPAP   . Type II diabetes mellitus   . GERD (gastroesophageal reflux disease)   . Migraine     "1-2 times/yr" (02/16/2014)  . Chronic back pain   . Skin cancer 2003    "3 cut off face" (02/16/2014)    History  Substance Use Topics  . Smoking status: Never Smoker   . Smokeless tobacco: Never Used  . Alcohol Use: No    Family History  Problem Relation Age of Onset  . Adopted: Yes    No Known Allergies  Objective: Temp: 98.4 F (36.9 C) (08/26 1526) Temp src: Oral (  08/26 1526) BP: 108/72 mmHg (08/26 1526) Pulse Rate: 93 (08/26 1526)  General: She is smiling and appears. Her daughter is with her. Skin: The left anterior chest pacemaker site is healing nicely without evidence of infection Lungs: Clear Cor: Regular S1 and S2 without murmurs   Assessment: The cause of her recent transient fever is unclear but she certainly has no evidence of bacteremia or pacemaker infection.  Plan: 1. Continue observation off of antibiotics 2. Followup here as needed   Michel Bickers, MD [facility] [place] [phone] pager   [phone] cell 03/14/2014, 3:41 PM

## 2014-03-15 ENCOUNTER — Encounter: Payer: Self-pay | Admitting: Internal Medicine

## 2014-03-20 ENCOUNTER — Other Ambulatory Visit (INDEPENDENT_AMBULATORY_CARE_PROVIDER_SITE_OTHER): Payer: Self-pay

## 2014-03-20 DIAGNOSIS — N289 Disorder of kidney and ureter, unspecified: Secondary | ICD-10-CM

## 2014-03-20 LAB — BASIC METABOLIC PANEL
BUN: 51 mg/dL — ABNORMAL HIGH (ref 6–23)
CO2: 25 mEq/L (ref 19–32)
Calcium: 9.5 mg/dL (ref 8.4–10.5)
Chloride: 104 mEq/L (ref 96–112)
Creatinine, Ser: 2.1 mg/dL — ABNORMAL HIGH (ref 0.4–1.2)
GFR: 25.14 mL/min — ABNORMAL LOW (ref >60.00)
Glucose, Bld: 125 mg/dL — ABNORMAL HIGH (ref 70–99)
Potassium: 4.9 mEq/L (ref 3.5–5.1)
Sodium: 137 mEq/L (ref 135–145)

## 2014-03-21 ENCOUNTER — Telehealth: Payer: Self-pay | Admitting: Cardiology

## 2014-03-21 DIAGNOSIS — Z79899 Other long term (current) drug therapy: Secondary | ICD-10-CM

## 2014-03-21 NOTE — Telephone Encounter (Signed)
Left message to call back  

## 2014-03-21 NOTE — Telephone Encounter (Signed)
Message copied by Earvin Hansen on Wed Mar 21, 2014  5:55 PM ------      Message from: Darlin Coco      Created: Tue Mar 20, 2014  9:58 PM       Kidneys better but still too dry.  Reduce lasix to 20 mg QOD ------

## 2014-03-21 NOTE — Telephone Encounter (Signed)
°  Pt's daughter is returning your call for test results. Please call back today.

## 2014-03-22 NOTE — Telephone Encounter (Signed)
Advised daughter and will recheck labs in 2 weeks

## 2014-04-03 ENCOUNTER — Other Ambulatory Visit (INDEPENDENT_AMBULATORY_CARE_PROVIDER_SITE_OTHER): Payer: Self-pay

## 2014-04-03 DIAGNOSIS — Z79899 Other long term (current) drug therapy: Secondary | ICD-10-CM

## 2014-04-03 LAB — BASIC METABOLIC PANEL
BUN: 45 mg/dL — ABNORMAL HIGH (ref 6–23)
CO2: 25 mEq/L (ref 19–32)
Calcium: 9.8 mg/dL (ref 8.4–10.5)
Chloride: 107 mEq/L (ref 96–112)
Creatinine, Ser: 1.8 mg/dL — ABNORMAL HIGH (ref 0.4–1.2)
GFR: 28.66 mL/min — ABNORMAL LOW (ref >60.00)
Glucose, Bld: 99 mg/dL (ref 70–99)
Potassium: 5 mEq/L (ref 3.5–5.1)
Sodium: 138 mEq/L (ref 135–145)

## 2014-04-05 ENCOUNTER — Telehealth: Payer: Self-pay | Admitting: Cardiology

## 2014-04-05 ENCOUNTER — Other Ambulatory Visit: Payer: PRIVATE HEALTH INSURANCE

## 2014-04-05 NOTE — Telephone Encounter (Signed)
Left message to call back  

## 2014-04-05 NOTE — Telephone Encounter (Signed)
Advised daughter

## 2014-04-05 NOTE — Telephone Encounter (Signed)
Message copied by Earvin Hansen on Thu Apr 05, 2014 11:16 AM ------      Message from: Darlin Coco      Created: Tue Apr 03, 2014  4:05 PM       Kidney function is better.  Continue current medication. ------

## 2014-04-05 NOTE — Telephone Encounter (Signed)
New message     Talk to [facility]

## 2014-05-17 ENCOUNTER — Encounter: Payer: Self-pay | Admitting: *Deleted

## 2014-05-18 ENCOUNTER — Ambulatory Visit: Payer: Self-pay

## 2014-05-22 ENCOUNTER — Encounter: Payer: Self-pay | Admitting: Internal Medicine

## 2014-05-22 ENCOUNTER — Ambulatory Visit (INDEPENDENT_AMBULATORY_CARE_PROVIDER_SITE_OTHER): Payer: PRIVATE HEALTH INSURANCE | Admitting: Internal Medicine

## 2014-05-22 VITALS — BP 132/68 | HR 76 | Ht 62.0 in | Wt 154.0 lb

## 2014-05-22 DIAGNOSIS — R001 Bradycardia, unspecified: Secondary | ICD-10-CM

## 2014-05-22 DIAGNOSIS — Z950 Presence of cardiac pacemaker: Secondary | ICD-10-CM

## 2014-05-22 DIAGNOSIS — Z45018 Encounter for adjustment and management of other part of cardiac pacemaker: Secondary | ICD-10-CM

## 2014-05-22 LAB — MDC_IDC_ENUM_SESS_TYPE_INCLINIC
Battery Remaining Longevity: 131 mo
Battery Voltage: 3.05 V
Brady Statistic AP VP Percent: 0.07 %
Brady Statistic AP VS Percent: 99.73 %
Brady Statistic AS VP Percent: 0 %
Brady Statistic AS VS Percent: 0.2 %
Brady Statistic RA Percent Paced: 99.8 %
Brady Statistic RV Percent Paced: 0.07 %
Date Time Interrogation Session: 20151103172153
Lead Channel Impedance Value: 418 Ohm
Lead Channel Impedance Value: 532 Ohm
Lead Channel Impedance Value: 551 Ohm
Lead Channel Impedance Value: 627 Ohm
Lead Channel Pacing Threshold Amplitude: 0.625 V
Lead Channel Pacing Threshold Amplitude: 0.625 V
Lead Channel Pacing Threshold Pulse Width: 0.4 ms
Lead Channel Pacing Threshold Pulse Width: 0.4 ms
Lead Channel Sensing Intrinsic Amplitude: 10 mV
Lead Channel Sensing Intrinsic Amplitude: 2.25 mV
Lead Channel Sensing Intrinsic Amplitude: 2.25 mV
Lead Channel Sensing Intrinsic Amplitude: 8.5 mV
Lead Channel Setting Pacing Amplitude: 1.5 V
Lead Channel Setting Pacing Amplitude: 2 V
Lead Channel Setting Pacing Pulse Width: 0.4 ms
Lead Channel Setting Sensing Sensitivity: 2 mV
Zone Setting Detection Interval: 400 ms
Zone Setting Detection Interval: 400 ms

## 2014-05-22 NOTE — Progress Notes (Signed)
      Patient Care Team: No Pcp Per Patient as PCP - General (General Practice)   HPI  Heather [name] is a 73 y.o. female Seen in follow-up for sinus node dysfunction for which she underwent pacemaker implantation 7/15  She then developed post implant fever. She was watched without evidence of systemic infection  The patient denies chest pain, shortness of breath, nocturnal dyspnea, orthopnea or peripheral edema.  There have been no palpitations, lightheadedness or syncope.    Past Medical History  Diagnosis Date  . CHF (congestive heart failure)   . Hypertension   . Murmur, cardiac   . Pulmonary hypertension   . Crohn's disease   . 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 Sinus bradycardia 01/29/2014  . High cholesterol   . OSA on CPAP   . Type II diabetes mellitus   . GERD (gastroesophageal reflux disease)   . Migraine     "1-2 times/yr" (02/16/2014)  . Chronic back pain   . Skin cancer 2003    "3 cut off face" (02/16/2014)    Past Surgical History  Procedure Laterality Date  . Insert / replace / remove pacemaker  02/14/2014  . Vaginal hysterectomy  1972  . Cataract extraction Bilateral 1982  . Cardiac catheterization  1995; 2005  . Skin cancer excision  2003     "face X 3" (02/16/2014)    Current Outpatient Prescriptions  Medication Sig Dispense Refill  . aspirin EC 81 MG EC tablet Take 1 tablet (81 mg total) by mouth daily. 30 tablet 0  . carvedilol (COREG) 6.25 MG tablet Take 1 tablet (6.25 mg total) by mouth 2 (two) times daily with a meal. 60 tablet 3  . diphenhydramine-acetaminophen (TYLENOL PM) 25-500 MG TABS Take 1 tablet by mouth at bedtime as needed (for sleep).    . furosemide (LASIX) 40 MG tablet Take 20 mg by mouth every other day. (1/2 of 40)    . losartan (COZAAR) 50 MG tablet Take 1 tablet (50 mg total) by mouth daily. 30 tablet 0  . omeprazole (PRILOSEC) 20 MG capsule Take 20 mg by mouth 2 (two) times daily.      No current facility-administered  medications for this visit.    No Known Allergies  Review of Systems negative except from HPI and PMH  Physical Exam BP 132/68 mmHg  Pulse 76  Ht 5' 2"  (1.575 m)  Wt 154 lb (69.854 kg)  BMI 28.16 kg/m2 Well developed and well nourished in no acute distress HENT normal E scleral and icterus clear Neck Supple JVP flat; carotids brisk and full Clear to ausculation Device pocket well healed; without hematoma or erythema.  There is no tethering Regular rate and rhythm, no murmurs gallops or rub Soft with active bowel sounds No clubbing cyanosis  Edema Alert and oriented, grossly normal motor and sensory function Skin Warm and Dry  ECG Atrial pacing /19/09/37  Assessment and  Plan  Sinus node dysfunction  Pacemaker implantation-St. Medtronic  The patient's device was interrogated and the information was fully reviewed.  The device was reprogrammed to  Maximize longevity

## 2014-05-22 NOTE — Patient Instructions (Signed)
Your physician recommends that you continue on your current medications as directed. Please refer to the Current Medication list given to you today.  Remote monitoring is used to monitor your Pacemaker of ICD from home. This monitoring reduces the number of office visits required to check your device to one time per year. It allows [place] to keep an eye on the functioning of your device to ensure it is working properly. You are scheduled for a device check from home on 08/23/14. You may send your transmission at any time that day. If you have a wireless device, the transmission will be sent automatically. After your physician reviews your transmission, you will receive a postcard with your next transmission date.  Your physician wants you to follow-up in: 9 months with Dr. Caryl Comes.  You will receive a reminder letter in the mail two months in advance. If you don't receive a letter, please call our office to schedule the follow-up appointment.

## 2014-06-11 ENCOUNTER — Ambulatory Visit: Payer: Self-pay | Attending: Family Medicine

## 2014-06-28 ENCOUNTER — Encounter (HOSPITAL_COMMUNITY): Payer: Self-pay | Admitting: Cardiovascular Disease

## 2014-07-16 ENCOUNTER — Ambulatory Visit: Payer: PRIVATE HEALTH INSURANCE | Admitting: Cardiology

## 2014-08-23 ENCOUNTER — Encounter: Payer: Self-pay | Admitting: *Deleted

## 2014-08-23 ENCOUNTER — Telehealth: Payer: Self-pay | Admitting: Cardiology

## 2014-08-23 NOTE — Telephone Encounter (Signed)
LMOVM reminding pt to send remote transmission.   

## 2014-08-24 ENCOUNTER — Encounter: Payer: Self-pay | Admitting: Cardiology

## 2014-09-20 ENCOUNTER — Encounter: Payer: Self-pay | Admitting: Cardiology

## 2014-09-20 ENCOUNTER — Ambulatory Visit (INDEPENDENT_AMBULATORY_CARE_PROVIDER_SITE_OTHER): Payer: Medicaid Other | Admitting: Cardiology

## 2014-09-20 VITALS — BP 120/64 | HR 72 | Ht 62.0 in | Wt 159.6 lb

## 2014-09-20 DIAGNOSIS — Z950 Presence of cardiac pacemaker: Secondary | ICD-10-CM

## 2014-09-20 DIAGNOSIS — I5033 Acute on chronic diastolic (congestive) heart failure: Secondary | ICD-10-CM

## 2014-09-20 DIAGNOSIS — I10 Essential (primary) hypertension: Secondary | ICD-10-CM

## 2014-09-20 NOTE — Patient Instructions (Signed)
Will obtain labs today and call you with the results (bmet)  Your physician recommends that you continue on your current medications as directed. Please refer to the Current Medication list given to you today.  Your physician wants you to follow-up in: 6 month ov/ekg/bmet  You will receive a reminder letter in the mail two months in advance. If you don't receive a letter, please call our office to schedule the follow-up appointment.

## 2014-09-20 NOTE — Progress Notes (Signed)
Cardiology Office Note   Date:  09/20/2014   ID:  Heather [name], DOB 09-Jan-1941, MRN [id number]  PCP:  Garret Reddish, MD  Cardiologist:   Darlin Coco, MD   No chief complaint on file.     History of Present Illness: Heather [name] is a 74 y.o. female who presents for follow-up office visit  This pleasant 74 year old woman from [place] is seen for a office visit. We had previously seen her at the hospital. She presented on 01/29/14 with weakness and junctional bradycardia and was found to be digitalis toxic with a digoxin level of 1.9. Her digoxin was stopped. Her heart rate gradually improved and she was discharged home. For her high blood pressure she was on carvedilol. She had an echocardiogram on 01/30/14 which showed an ejection fraction of 25-85%, grade 2 diastolic dysfunction, moderate aortic stenosis, and enlarged left atrium at 57 mm. The patient was readmitted on 02/12/14 with marked sinus bradycardia. Her heart rate was in the 30s and she required a temporary pacing wire. Her carvedilol was stopped but her heart rate did not improve. Therefore on 02/14/14 she underwent placement of a Medtronic dual-chamber pacemaker by Dr. Caryl Comes. Since her pacemaker she has felt well. She has been on long-term spironolactone for her previous left ventricular dysfunction.  We are checking a basal metabolic panel today. She denies any chest pain or shortness of breath.  No palpitations.  No dizziness or syncope.  Past Medical History  Diagnosis Date  . CHF (congestive heart failure)   . Hypertension   . Murmur, cardiac   . Pulmonary hypertension   . Crohn's disease   . 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 Sinus bradycardia 01/29/2014  . High cholesterol   . OSA on CPAP   . Type II diabetes mellitus   . GERD (gastroesophageal reflux disease)   . Migraine     "1-2 times/yr" (02/16/2014)  . Chronic back pain   . Skin cancer 2003    "3 cut off face" (02/16/2014)     Past Surgical History  Procedure Laterality Date  . Insert / replace / remove pacemaker  02/14/2014  . Vaginal hysterectomy  1972  . Cataract extraction Bilateral 1982  . Cardiac catheterization  1995; 2005  . Skin cancer excision  2003     "face X 3" (02/16/2014)  . Temporary pacemaker insertion N/A 02/12/2014    Procedure: TEMPORARY PACEMAKER INSERTION;  Surgeon: Lorretta Harp, MD;  Location: [facility] CATH LAB;  Service: Cardiovascular;  Laterality: N/A;  . Permanent pacemaker insertion N/A 02/14/2014    Procedure: PERMANENT PACEMAKER INSERTION;  Surgeon: Deboraha Sprang, MD;  Location: [facility] CATH LAB;  Service: Cardiovascular;  Laterality: N/A;     Current Outpatient Prescriptions  Medication Sig Dispense Refill  . aspirin EC 81 MG EC tablet Take 1 tablet (81 mg total) by mouth daily. 30 tablet 0  . carvedilol (COREG) 6.25 MG tablet Take 1 tablet (6.25 mg total) by mouth 2 (two) times daily with a meal. 60 tablet 3  . diphenhydramine-acetaminophen (TYLENOL PM) 25-500 MG TABS Take 1 tablet by mouth at bedtime as needed (for sleep).    . furosemide (LASIX) 40 MG tablet Take 20 mg by mouth every other day. (1/2 of 40)    . losartan (COZAAR) 50 MG tablet Take 1 tablet (50 mg total) by mouth daily. 30 tablet 0  . omeprazole (PRILOSEC) 20 MG capsule Take 20 mg by mouth 2 (two) times daily.     Marland Kitchen  spironolactone (ALDACTONE) 25 MG tablet Take 25 mg by mouth every morning.     No current facility-administered medications for this visit.    Allergies:   Review of patient's allergies indicates no known allergies.    Social History:  The patient  reports that she has never smoked. She has never used smokeless tobacco. She reports that she does not drink alcohol or use illicit drugs.   Family History:  The patient's family history includes Heart disease in her mother; Hypertension in her mother. She was adopted.    ROS:  Please see the history of present illness.   Otherwise, review of systems  are positive for none.   All other systems are reviewed and negative.    PHYSICAL EXAM: VS:  BP 120/64 mmHg  Pulse 72  Ht 5' 2"  (1.575 m)  Wt 159 lb 9.6 oz (72.394 kg)  BMI 29.18 kg/m2 , BMI Body mass index is 29.18 kg/(m^2). GEN: Well nourished, well developed, in no acute distress HEENT: normal Neck: no JVD, carotid bruits, or masses Cardiac: RRR; no murmurs, rubs, or gallops,no edema  Respiratory:  clear to auscultation bilaterally, normal work of breathing GI: soft, nontender, nondistended, + BS MS: no deformity or atrophy Skin: warm and dry, no rash Neuro:  Strength and sensation are intact Psych: euthymic mood, full affect   EKG:  EKG is ordered today. The ekg ordered today demonstrates atrial paced rhythm.  Heart rate is 72.   Recent Labs: 01/30/2014: Magnesium 2.3 02/12/2014: Pro B Natriuretic peptide (BNP) 1430.0*; TSH 1.610 02/13/2014: ALT 40* 03/12/2014: Hemoglobin 14.3; Platelets 259.0 04/03/2014: BUN 45*; Creatinine 1.8*; Potassium 5.0; Sodium 138    Lipid Panel No results found for: CHOL, TRIG, HDL, CHOLHDL, VLDL, LDLCALC, LDLDIRECT    Wt Readings from Last 3 Encounters:  09/20/14 159 lb 9.6 oz (72.394 kg)  05/22/14 154 lb (69.854 kg)  03/14/14 155 lb (70.308 kg)         ASSESSMENT AND PLAN:  1. sick sinus syndrome with symptomatic sinus bradycardia, now with functioning dual-chamber pacemaker inserted 02/14/14 (Medtronic) 2. diabetes mellitus 3. essential hypertension 4. chronic diastolic heart failure   Current medicines are reviewed at length with the patient today.  The patient does not have concerns regarding medicines.  The following changes have been made:  no change  Labs/ tests ordered today include: Basal metabolic panel to check on renal function and potassium while on spironolactone and losartan.  She is also on Lasix.    Orders Placed This Encounter  Procedures  . Basic metabolic panel  . Basic metabolic panel  . EKG 12-Lead      Disposition:   FU with Dr. Mare Ferrari in 6 months for office visit EKG and basal metabolic panel   Signed, Darlin Coco, MD  09/20/2014 4:46 PM    [facility] [address], [place], [place]  [address] Phone: [phone]; Fax: [phone]

## 2014-09-21 LAB — BASIC METABOLIC PANEL
BUN: 34 mg/dL — ABNORMAL HIGH (ref 6–23)
CO2: 25 mEq/L (ref 19–32)
Calcium: 9.6 mg/dL (ref 8.4–10.5)
Chloride: 110 mEq/L (ref 96–112)
Creatinine, Ser: 1.8 mg/dL — ABNORMAL HIGH (ref 0.40–1.20)
GFR: 29.18 mL/min — ABNORMAL LOW (ref >60.00)
Glucose, Bld: 155 mg/dL — ABNORMAL HIGH (ref 70–99)
Potassium: 5 mEq/L (ref 3.5–5.1)
Sodium: 138 mEq/L (ref 135–145)

## 2014-09-21 NOTE — Progress Notes (Signed)
Quick Note:  Please report to patient. The recent labs are stable. Continue same medication and careful diet. ______ 

## 2014-11-02 ENCOUNTER — Ambulatory Visit (INDEPENDENT_AMBULATORY_CARE_PROVIDER_SITE_OTHER): Payer: Self-pay | Admitting: *Deleted

## 2014-11-02 DIAGNOSIS — R001 Bradycardia, unspecified: Secondary | ICD-10-CM

## 2014-11-02 NOTE — Progress Notes (Signed)
Remote pacemaker transmission.   

## 2014-11-04 LAB — MDC_IDC_ENUM_SESS_TYPE_REMOTE
Battery Remaining Longevity: 113 mo
Battery Voltage: 3.02 V
Brady Statistic AP VP Percent: 0.1 %
Brady Statistic AP VS Percent: 98.84 %
Brady Statistic AS VP Percent: 0 %
Brady Statistic AS VS Percent: 1.06 %
Brady Statistic RA Percent Paced: 98.94 %
Brady Statistic RV Percent Paced: 0.1 %
Date Time Interrogation Session: 20160415121421
Lead Channel Impedance Value: 399 Ohm
Lead Channel Impedance Value: 456 Ohm
Lead Channel Impedance Value: 513 Ohm
Lead Channel Impedance Value: 551 Ohm
Lead Channel Pacing Threshold Amplitude: 0.625 V
Lead Channel Pacing Threshold Amplitude: 0.625 V
Lead Channel Pacing Threshold Pulse Width: 0.4 ms
Lead Channel Pacing Threshold Pulse Width: 0.4 ms
Lead Channel Sensing Intrinsic Amplitude: 1.875 mV
Lead Channel Sensing Intrinsic Amplitude: 8.75 mV
Lead Channel Setting Pacing Amplitude: 1.5 V
Lead Channel Setting Pacing Amplitude: 2 V
Lead Channel Setting Pacing Pulse Width: 0.4 ms
Lead Channel Setting Sensing Sensitivity: 2 mV
Zone Setting Detection Interval: 400 ms
Zone Setting Detection Interval: 400 ms

## 2014-11-09 ENCOUNTER — Other Ambulatory Visit: Payer: Self-pay

## 2014-11-09 MED ORDER — SPIRONOLACTONE 25 MG PO TABS: 25.0000 mg | ORAL_TABLET | Freq: Every morning | ORAL | Status: DC

## 2014-11-27 ENCOUNTER — Encounter: Payer: Self-pay | Admitting: Cardiology

## 2014-11-29 ENCOUNTER — Encounter: Payer: Self-pay | Admitting: Internal Medicine

## 2015-01-25 ENCOUNTER — Telehealth: Payer: Self-pay | Admitting: Cardiology

## 2015-01-25 ENCOUNTER — Other Ambulatory Visit: Payer: Self-pay | Admitting: *Deleted

## 2015-01-25 MED ORDER — SPIRONOLACTONE 25 MG PO TABS: 25.0000 mg | ORAL_TABLET | Freq: Every morning | ORAL | Status: DC

## 2015-01-25 MED ORDER — LOSARTAN POTASSIUM 50 MG PO TABS: 50.0000 mg | ORAL_TABLET | Freq: Every day | ORAL | Status: DC

## 2015-01-25 MED ORDER — CARVEDILOL 6.25 MG PO TABS: 6.2500 mg | ORAL_TABLET | Freq: Two times a day (BID) | ORAL | Status: DC

## 2015-01-25 NOTE — Telephone Encounter (Signed)
Walk in pt form-pt needs refills-placed in refill box

## 2015-01-29 ENCOUNTER — Ambulatory Visit: Payer: Self-pay | Attending: Internal Medicine

## 2015-02-03 ENCOUNTER — Encounter (HOSPITAL_COMMUNITY): Payer: Self-pay | Admitting: Emergency Medicine

## 2015-02-03 ENCOUNTER — Emergency Department (INDEPENDENT_AMBULATORY_CARE_PROVIDER_SITE_OTHER)
Admission: EM | Admit: 2015-02-03 | Discharge: 2015-02-03 | Disposition: A | Discharge status: Home / Self Care | Payer: Self-pay | Source: Home / Self Care | Attending: Family Medicine | Admitting: Family Medicine

## 2015-02-03 DIAGNOSIS — J069 Acute upper respiratory infection, unspecified: Secondary | ICD-10-CM

## 2015-02-03 MED ORDER — IPRATROPIUM BROMIDE 0.06 % NA SOLN: 2.0000 | Freq: Four times a day (QID) | NASAL | Status: DC

## 2015-02-03 MED ORDER — OLOPATADINE HCL 0.1 % OP SOLN: 1.0000 [drp] | Freq: Two times a day (BID) | OPHTHALMIC | Status: DC

## 2015-02-03 NOTE — ED Provider Notes (Signed)
CSN: [id number]     Arrival date & time 02/03/15  1301 History   First MD Initiated Contact with Patient 02/03/15 1313     Chief Complaint  Patient presents with  . URI   (Consider location/radiation/quality/duration/timing/severity/associated sxs/prior Treatment) Patient is a 74 y.o. female presenting with URI. The history is provided by the patient and a friend. A language interpreter was used (friend).  URI Presenting symptoms: congestion, cough and rhinorrhea   Presenting symptoms: no fever   Severity:  Mild Onset quality:  Gradual Chronicity:  New Relieved by:  None tried Worsened by:  Nothing tried Associated symptoms: no wheezing   Risk factors: chronic cardiac disease     Past Medical History  Diagnosis Date  . CHF (congestive heart failure)   . Hypertension   . Murmur, cardiac   . Pulmonary hypertension   . Crohn's disease   . 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 Sinus bradycardia 01/29/2014  . High cholesterol   . OSA on CPAP   . Type II diabetes mellitus   . GERD (gastroesophageal reflux disease)   . Migraine     "1-2 times/yr" (02/16/2014)  . Chronic back pain   . Skin cancer 2003    "3 cut off face" (02/16/2014)   Past Surgical History  Procedure Laterality Date  . Insert / replace / remove pacemaker  02/14/2014  . Vaginal hysterectomy  1972  . Cataract extraction Bilateral 1982  . Cardiac catheterization  1995; 2005  . Skin cancer excision  2003     "face X 3" (02/16/2014)  . Temporary pacemaker insertion N/A 02/12/2014    Procedure: TEMPORARY PACEMAKER INSERTION;  Surgeon: Lorretta Harp, MD;  Location: [facility] CATH LAB;  Service: Cardiovascular;  Laterality: N/A;  . Permanent pacemaker insertion N/A 02/14/2014    Procedure: PERMANENT PACEMAKER INSERTION;  Surgeon: Deboraha Sprang, MD;  Location: [facility] CATH LAB;  Service: Cardiovascular;  Laterality: N/A;   Family History  Problem Relation Age of Onset  . Adopted: Yes  . Hypertension Mother   . Heart disease  Mother    History  Substance Use Topics  . Smoking status: Never Smoker   . Smokeless tobacco: Never Used  . Alcohol Use: No   OB History    No data available     Review of Systems  Constitutional: Negative.  Negative for fever.  HENT: Positive for congestion, postnasal drip and rhinorrhea.   Respiratory: Positive for cough. Negative for shortness of breath and wheezing.   Cardiovascular: Negative.     Allergies  Review of patient's allergies indicates no known allergies.  Home Medications   Prior to Admission medications   Medication Sig Start Date End Date Taking? Authorizing Provider  aspirin EC 81 MG EC tablet Take 1 tablet (81 mg total) by mouth daily. 02/01/14  Yes Charlynne Cousins, MD  carvedilol (COREG) 6.25 MG tablet Take 1 tablet (6.25 mg total) by mouth 2 (two) times daily with a meal. 01/25/15  Yes Darlin Coco, MD  furosemide (LASIX) 40 MG tablet Take 20 mg by mouth every other day. (1/2 of 40) 02/01/14  Yes Charlynne Cousins, MD  losartan (COZAAR) 50 MG tablet Take 1 tablet (50 mg total) by mouth daily. 01/25/15  Yes Darlin Coco, MD  omeprazole (PRILOSEC) 20 MG capsule Take 20 mg by mouth 2 (two) times daily.    Yes Historical Provider, MD  spironolactone (ALDACTONE) 25 MG tablet Take 1 tablet (25 mg total) by mouth every morning. 01/25/15  Yes  Darlin Coco, MD  diphenhydramine-acetaminophen (TYLENOL PM) 25-500 MG TABS Take 1 tablet by mouth at bedtime as needed (for sleep).    Historical Provider, MD  ipratropium (ATROVENT) 0.06 % nasal spray Place 2 sprays into both nostrils 4 (four) times daily. 02/03/15   Billy Fischer, MD  olopatadine (PATANOL) 0.1 % ophthalmic solution Place 1 drop into both eyes 2 (two) times daily. 02/03/15   Billy Fischer, MD   BP 130/78 mmHg  Pulse 85  Temp(Src) 98.3 F (36.8 C) (Oral)  Resp 18  SpO2 97% Physical Exam  Constitutional: She is oriented to person, place, and time. She appears well-developed and well-nourished.   HENT:  Head: Normocephalic.  Right Ear: External ear normal.  Left Ear: External ear normal.  Nose: Nose normal.  Mouth/Throat: Oropharynx is clear and moist.  Eyes: EOM are normal. Pupils are equal, round, and reactive to light. Right conjunctiva is injected. Left conjunctiva is injected.  Neck: Normal range of motion. Neck supple.  Cardiovascular: Normal rate, regular rhythm, normal heart sounds and intact distal pulses.   Pulmonary/Chest: Effort normal and breath sounds normal.  Lymphadenopathy:    She has no cervical adenopathy.  Neurological: She is alert and oriented to person, place, and time.  Skin: Skin is warm and dry.  Nursing note and vitals reviewed.   ED Course  Procedures (including critical care time) Labs Review Labs Reviewed - No data to display  Imaging Review No results found.   MDM   1. URI (upper respiratory infection)       Billy Fischer, MD 02/03/15 1325

## 2015-02-03 NOTE — ED Notes (Signed)
C/o cold sx onset 5 days Sx include cough, congestion, runny nose and also reports bilateral eye irritation Alert, no signs of acute distress.

## 2015-02-04 ENCOUNTER — Telehealth: Payer: Self-pay | Admitting: Cardiology

## 2015-02-04 ENCOUNTER — Ambulatory Visit (INDEPENDENT_AMBULATORY_CARE_PROVIDER_SITE_OTHER): Payer: Self-pay | Admitting: *Deleted

## 2015-02-04 DIAGNOSIS — R001 Bradycardia, unspecified: Secondary | ICD-10-CM

## 2015-02-04 NOTE — Telephone Encounter (Signed)
LMOVM reminding pt to send remote transmission.   

## 2015-02-05 DIAGNOSIS — R001 Bradycardia, unspecified: Secondary | ICD-10-CM

## 2015-02-05 NOTE — Progress Notes (Signed)
Remote pacemaker transmission.   

## 2015-02-20 LAB — CUP PACEART REMOTE DEVICE CHECK
Battery Remaining Longevity: 112 mo
Battery Voltage: 3.02 V
Brady Statistic AP VP Percent: 0.04 %
Brady Statistic AP VS Percent: 97.62 %
Brady Statistic AS VP Percent: 0 %
Brady Statistic AS VS Percent: 2.33 %
Brady Statistic RA Percent Paced: 97.67 %
Brady Statistic RV Percent Paced: 0.05 %
Date Time Interrogation Session: 20160719133153
Lead Channel Impedance Value: 399 Ohm
Lead Channel Impedance Value: 494 Ohm
Lead Channel Impedance Value: 513 Ohm
Lead Channel Impedance Value: 551 Ohm
Lead Channel Pacing Threshold Amplitude: 0.625 V
Lead Channel Pacing Threshold Amplitude: 0.75 V
Lead Channel Pacing Threshold Pulse Width: 0.4 ms
Lead Channel Pacing Threshold Pulse Width: 0.4 ms
Lead Channel Sensing Intrinsic Amplitude: 11.25 mV
Lead Channel Sensing Intrinsic Amplitude: 11.25 mV
Lead Channel Sensing Intrinsic Amplitude: 2.25 mV
Lead Channel Sensing Intrinsic Amplitude: 2.25 mV
Lead Channel Setting Pacing Amplitude: 1.5 V
Lead Channel Setting Pacing Amplitude: 2 V
Lead Channel Setting Pacing Pulse Width: 0.4 ms
Lead Channel Setting Sensing Sensitivity: 2 mV
Zone Setting Detection Interval: 400 ms
Zone Setting Detection Interval: 400 ms

## 2015-03-05 ENCOUNTER — Encounter: Payer: Self-pay | Admitting: Cardiology

## 2015-03-12 ENCOUNTER — Encounter: Payer: Self-pay | Admitting: Internal Medicine

## 2015-03-13 IMAGING — CR DG CHEST 2V
2 series · 2 of 2 positions shown · non-contrast
Comparison: 01/19/2014

CLINICAL DATA: Weakness.  History of Crohn's disease.

EXAM:
CHEST  2 VIEW

[x chest ap]
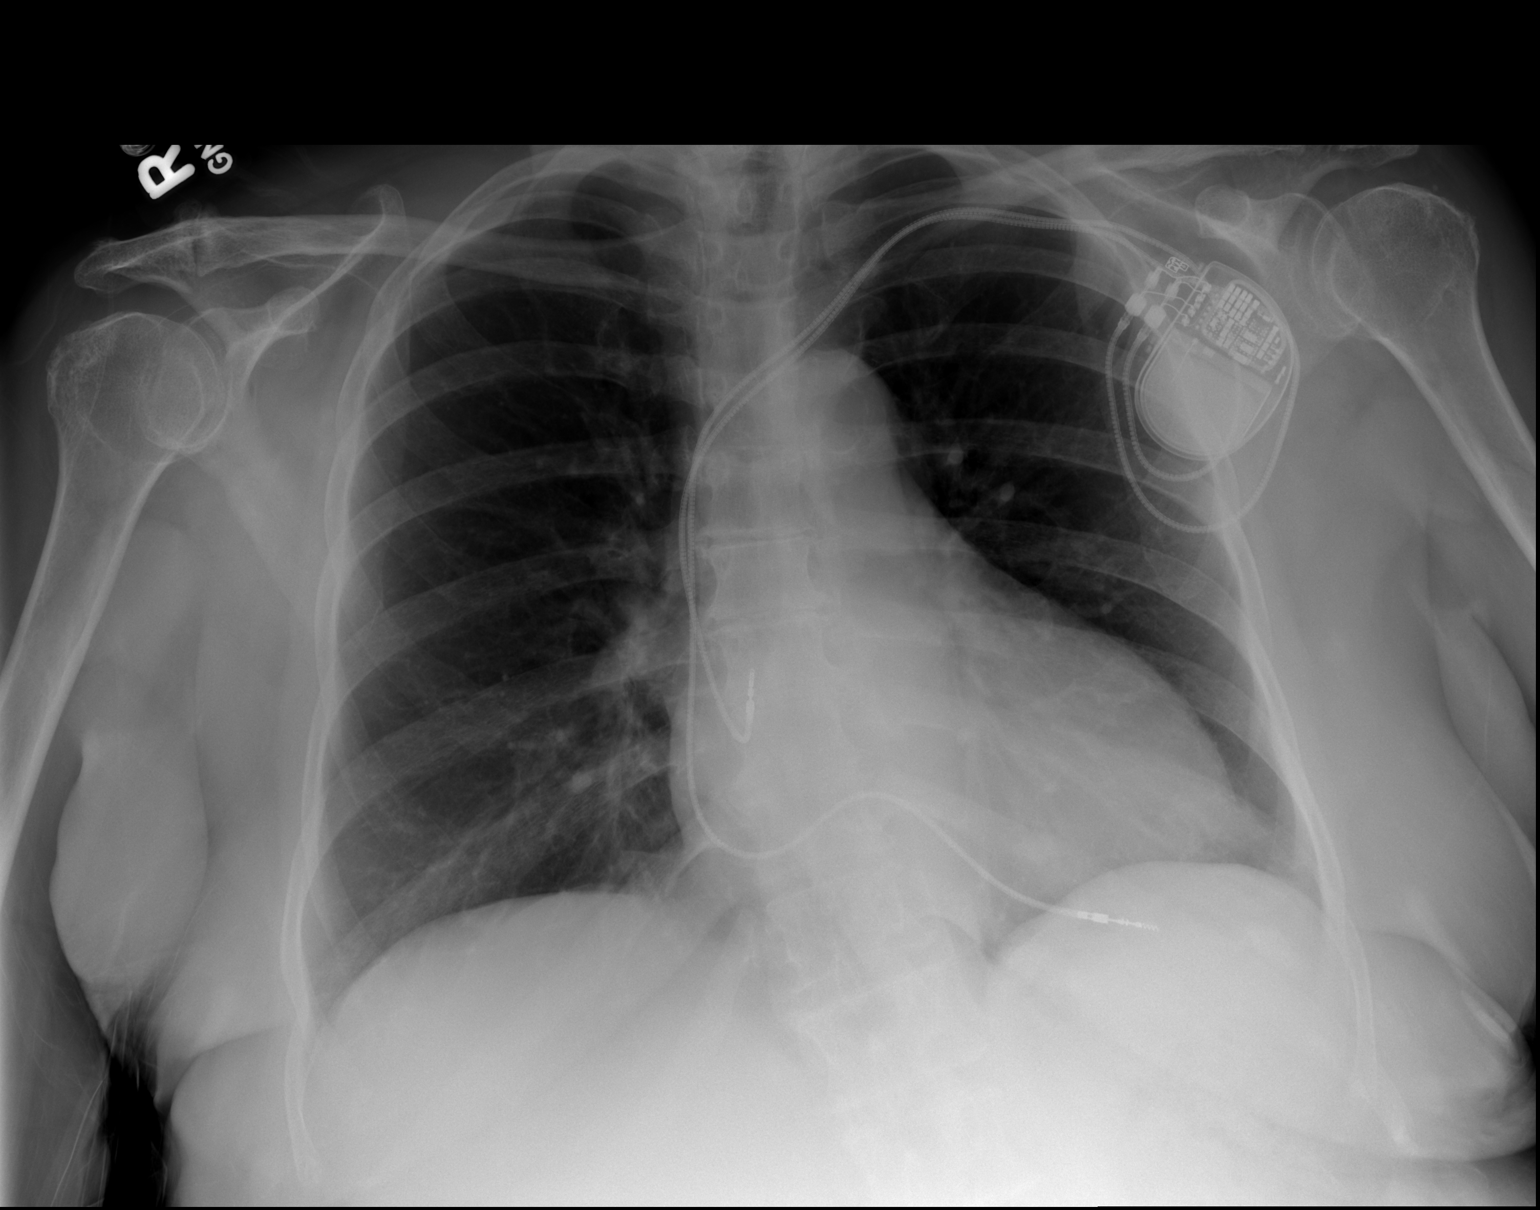

[w chest lat]
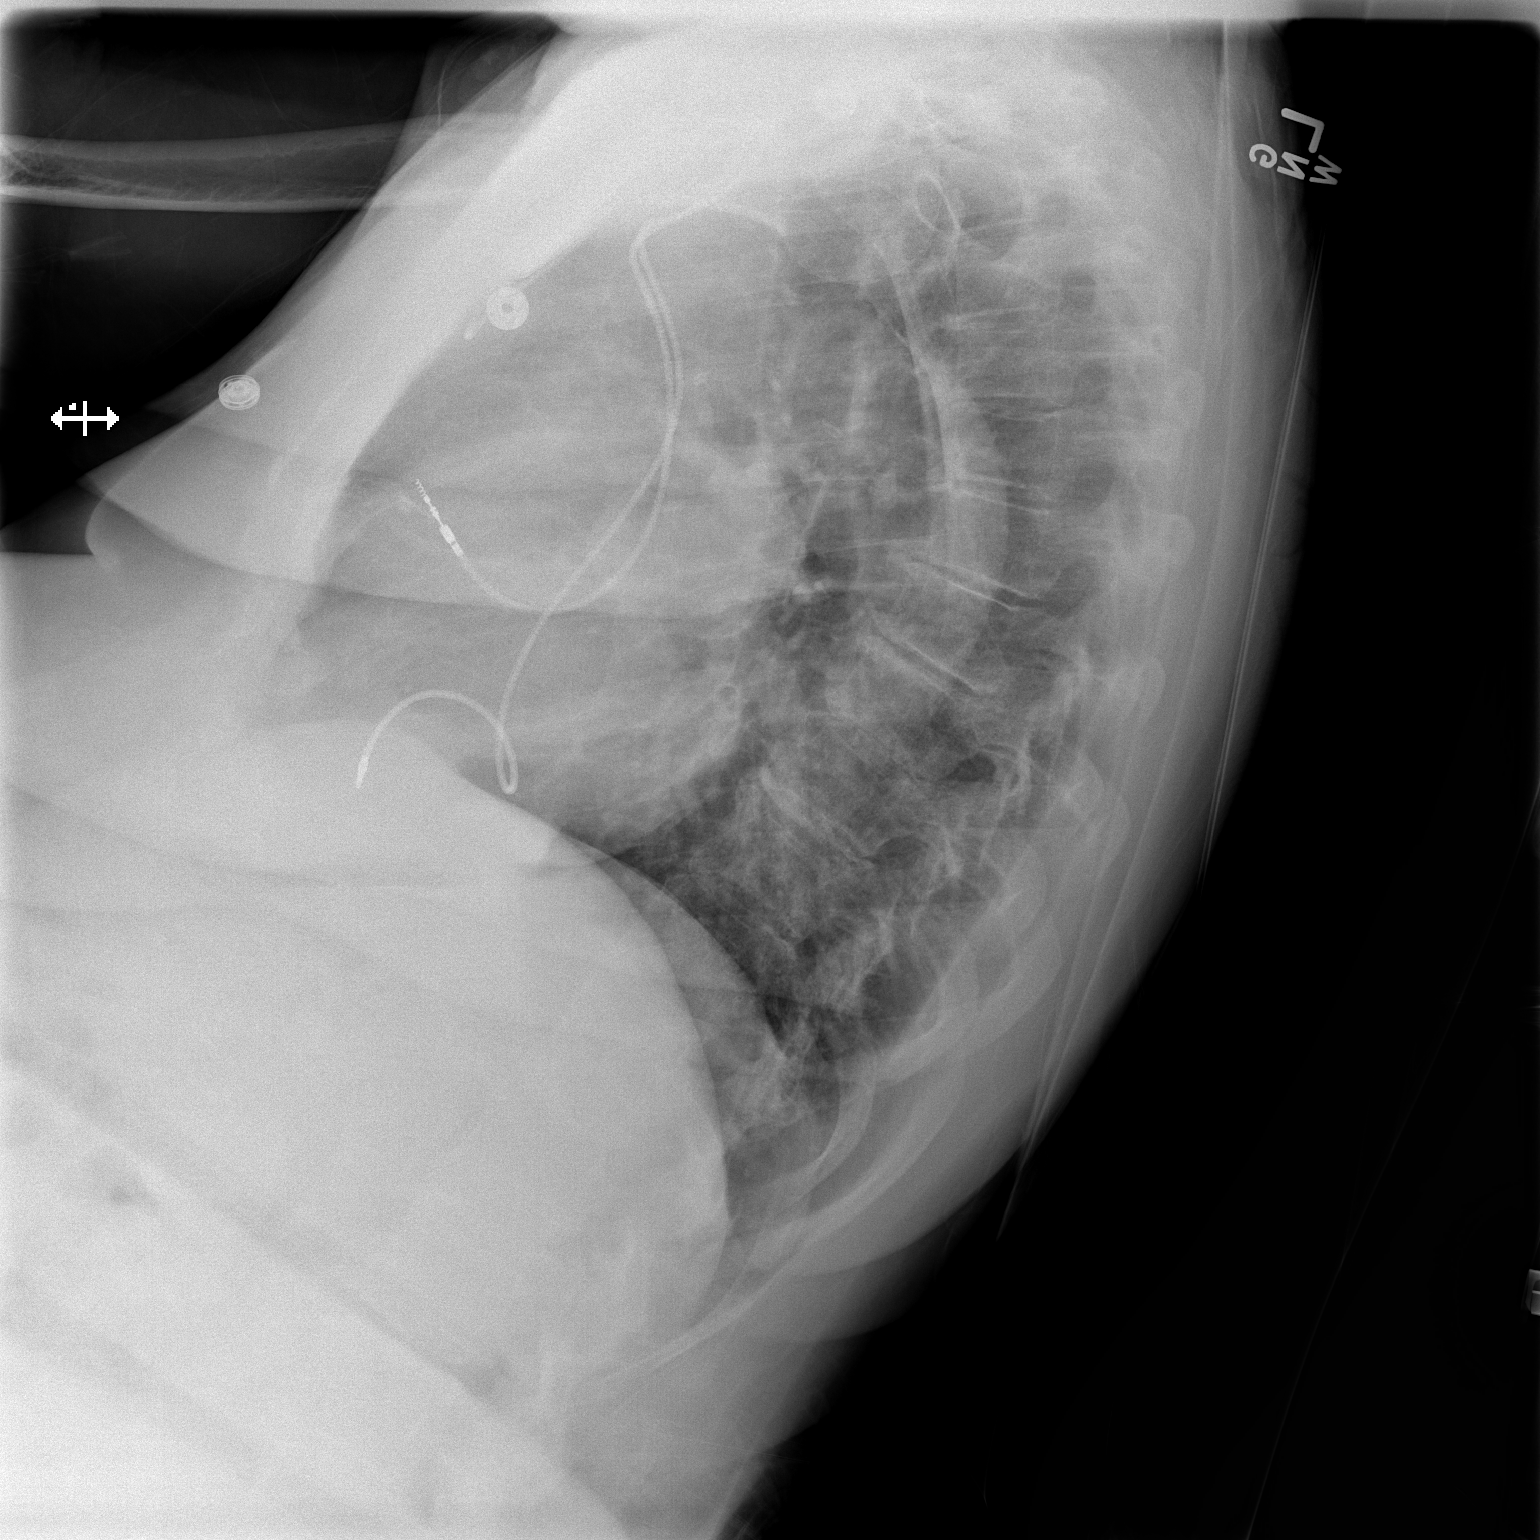

[2 of 2 positions shown; findings below may reference images not displayed]

FINDINGS: Cardiac pacemaker is unchanged. The heart size and mediastinal
contours are within normal limits. Both lungs are clear. The
visualized skeletal structures are unremarkable.
IMPRESSION: No active cardiopulmonary disease.

## 2015-03-15 ENCOUNTER — Encounter: Payer: Self-pay | Admitting: *Deleted

## 2015-04-17 ENCOUNTER — Encounter: Payer: Self-pay | Admitting: *Deleted

## 2015-05-06 ENCOUNTER — Encounter: Payer: Self-pay | Admitting: Family Medicine

## 2015-05-06 ENCOUNTER — Ambulatory Visit: Payer: No Typology Code available for payment source | Attending: Family Medicine | Admitting: Family Medicine

## 2015-05-06 VITALS — BP 121/74 | HR 76 | Temp 98.4°F | Resp 16 | Ht 62.0 in | Wt 162.0 lb

## 2015-05-06 DIAGNOSIS — K5090 Crohn's disease, unspecified, without complications: Secondary | ICD-10-CM | POA: Insufficient documentation

## 2015-05-06 DIAGNOSIS — N189 Chronic kidney disease, unspecified: Secondary | ICD-10-CM | POA: Insufficient documentation

## 2015-05-06 DIAGNOSIS — I130 Hypertensive heart and chronic kidney disease with heart failure and stage 1 through stage 4 chronic kidney disease, or unspecified chronic kidney disease: Secondary | ICD-10-CM | POA: Insufficient documentation

## 2015-05-06 DIAGNOSIS — Z0000 Encounter for general adult medical examination without abnormal findings: Secondary | ICD-10-CM

## 2015-05-06 DIAGNOSIS — Z79899 Other long term (current) drug therapy: Secondary | ICD-10-CM | POA: Insufficient documentation

## 2015-05-06 DIAGNOSIS — I272 Other secondary pulmonary hypertension: Secondary | ICD-10-CM | POA: Insufficient documentation

## 2015-05-06 DIAGNOSIS — Z7982 Long term (current) use of aspirin: Secondary | ICD-10-CM | POA: Insufficient documentation

## 2015-05-06 DIAGNOSIS — Z85828 Personal history of other malignant neoplasm of skin: Secondary | ICD-10-CM | POA: Insufficient documentation

## 2015-05-06 DIAGNOSIS — E875 Hyperkalemia: Secondary | ICD-10-CM

## 2015-05-06 DIAGNOSIS — E1122 Type 2 diabetes mellitus with diabetic chronic kidney disease: Secondary | ICD-10-CM

## 2015-05-06 DIAGNOSIS — M25551 Pain in right hip: Secondary | ICD-10-CM | POA: Insufficient documentation

## 2015-05-06 DIAGNOSIS — I509 Heart failure, unspecified: Secondary | ICD-10-CM | POA: Insufficient documentation

## 2015-05-06 DIAGNOSIS — G4733 Obstructive sleep apnea (adult) (pediatric): Secondary | ICD-10-CM | POA: Insufficient documentation

## 2015-05-06 DIAGNOSIS — E7800 Pure hypercholesterolemia, unspecified: Secondary | ICD-10-CM | POA: Insufficient documentation

## 2015-05-06 DIAGNOSIS — I10 Essential (primary) hypertension: Secondary | ICD-10-CM

## 2015-05-06 DIAGNOSIS — E119 Type 2 diabetes mellitus without complications: Secondary | ICD-10-CM | POA: Insufficient documentation

## 2015-05-06 DIAGNOSIS — M25552 Pain in left hip: Secondary | ICD-10-CM

## 2015-05-06 DIAGNOSIS — Z950 Presence of cardiac pacemaker: Secondary | ICD-10-CM | POA: Insufficient documentation

## 2015-05-06 DIAGNOSIS — Z8249 Family history of ischemic heart disease and other diseases of the circulatory system: Secondary | ICD-10-CM | POA: Insufficient documentation

## 2015-05-06 DIAGNOSIS — L821 Other seborrheic keratosis: Secondary | ICD-10-CM | POA: Insufficient documentation

## 2015-05-06 DIAGNOSIS — N184 Chronic kidney disease, stage 4 (severe): Secondary | ICD-10-CM

## 2015-05-06 DIAGNOSIS — K219 Gastro-esophageal reflux disease without esophagitis: Secondary | ICD-10-CM | POA: Insufficient documentation

## 2015-05-06 LAB — POCT GLYCOSYLATED HEMOGLOBIN (HGB A1C): Hemoglobin A1C: 5.8

## 2015-05-06 LAB — COMPLETE METABOLIC PANEL WITH GFR
ALT: 59 U/L — ABNORMAL HIGH (ref 6–29)
AST: 29 U/L (ref 10–35)
Albumin: 4.1 g/dL (ref 3.6–5.1)
Alkaline Phosphatase: 203 U/L — ABNORMAL HIGH (ref 33–130)
BUN: 45 mg/dL — ABNORMAL HIGH (ref 7–25)
CO2: 22 mmol/L (ref 20–31)
Calcium: 9.7 mg/dL (ref 8.6–10.4)
Chloride: 110 mmol/L (ref 98–110)
Creat: 2.16 mg/dL — ABNORMAL HIGH (ref 0.60–0.93)
GFR, Est African American: 25 mL/min — ABNORMAL LOW (ref >=60)
GFR, Est Non African American: 22 mL/min — ABNORMAL LOW (ref >=60)
Glucose, Bld: 87 mg/dL (ref 65–99)
Potassium: 6.5 mmol/L (ref 3.5–5.3)
Sodium: 141 mmol/L (ref 135–146)
Total Bilirubin: 0.4 mg/dL (ref 0.2–1.2)
Total Protein: 7.9 g/dL (ref 6.1–8.1)

## 2015-05-06 LAB — GLUCOSE, POCT (MANUAL RESULT ENTRY): POC Glucose: 89 mg/dl (ref 70–99)

## 2015-05-06 MED ORDER — PANTOPRAZOLE SODIUM 40 MG PO TBEC: 40.0000 mg | DELAYED_RELEASE_TABLET | Freq: Every day | ORAL | Status: DC

## 2015-05-06 MED ORDER — FUROSEMIDE 40 MG PO TABS: 20.0000 mg | ORAL_TABLET | ORAL | Status: DC

## 2015-05-06 MED ORDER — HYDROCHLOROTHIAZIDE 12.5 MG PO CAPS: 12.5000 mg | ORAL_CAPSULE | Freq: Every day | ORAL | Status: DC

## 2015-05-06 MED ORDER — HYDROCHLOROTHIAZIDE 12.5 MG PO CAPS
12.5000 mg | ORAL_CAPSULE | Freq: Every day | ORAL | Status: DC
Start: 2015-05-06 — End: 2015-05-08

## 2015-05-06 MED ORDER — ACETAMINOPHEN ER 650 MG PO TBCR: 650.0000 mg | EXTENDED_RELEASE_TABLET | Freq: Three times a day (TID) | ORAL | Status: DC | PRN

## 2015-05-06 MED ORDER — CARVEDILOL 6.25 MG PO TABS: 6.2500 mg | ORAL_TABLET | Freq: Two times a day (BID) | ORAL | Status: DC

## 2015-05-06 MED ORDER — LOSARTAN POTASSIUM 50 MG PO TABS: 50.0000 mg | ORAL_TABLET | Freq: Every day | ORAL | Status: DC

## 2015-05-06 MED ORDER — NIFEDIPINE ER 30 MG PO TB24: 30.0000 mg | ORAL_TABLET | Freq: Every day | ORAL | Status: DC

## 2015-05-06 MED ORDER — SPIRONOLACTONE 25 MG PO TABS: 25.0000 mg | ORAL_TABLET | Freq: Every morning | ORAL | Status: DC

## 2015-05-06 NOTE — Assessment & Plan Note (Signed)
A; SKs on skin P; reassurance

## 2015-05-06 NOTE — Assessment & Plan Note (Signed)
A; BP well controlled Med: compliant P: refilled all meds

## 2015-05-06 NOTE — Assessment & Plan Note (Signed)
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 Avoiding NSAID due to CKD P: Tylenol Exercise  Hip x-ray

## 2015-05-06 NOTE — Progress Notes (Signed)
Subjective:  Patient ID: Heather [name], female    DOB: 30-Jul-1940  Age: 74 y.o. MRN: [id number]  CC: Leg Pain; Medication Refill; and Establish Care   HPI [facility] presents for   1. Leg pain: r side x 6 months. No injury. R lateral hip pain. No knee pain. No back pain. No rash. Ibuprofen and aleve help temporarily. Minimal exercise.   2. Diabetes: check CBGs on Sundays. No medications. CBGs are in 90s. No vision changes.   3. Moles: on arm and back. Getting larger and darker. No pain. Has hx of biopsy of facial moles.   4. HTN; in setting of CKD and CHF. S/p pacemaker. Followed by cardiology. No HA, CP or SOB.   Current Outpatient Prescriptions on File Prior to Visit  Medication Sig Dispense Refill  . aspirin EC 81 MG EC tablet Take 1 tablet (81 mg total) by mouth daily. 30 tablet 0  . diphenhydramine-acetaminophen (TYLENOL PM) 25-500 MG TABS Take 1 tablet by mouth at bedtime as needed (for sleep).    Marland Kitchen ipratropium (ATROVENT) 0.06 % nasal spray Place 2 sprays into both nostrils 4 (four) times daily. 15 mL 12  . olopatadine (PATANOL) 0.1 % ophthalmic solution Place 1 drop into both eyes 2 (two) times daily. 5 mL 1   No current facility-administered medications on file prior to visit.   Past Medical History  Diagnosis Date  . CHF (congestive heart failure)   . Hypertension   . Murmur, cardiac   . Pulmonary hypertension   . Crohn's disease   . 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 Sinus bradycardia 01/29/2014  . High cholesterol   . OSA on CPAP   . Type II diabetes mellitus   . GERD (gastroesophageal reflux disease)   . Migraine     "1-2 times/yr" (02/16/2014)  . Chronic back pain   . Skin cancer 2003    "3 cut off face" (02/16/2014)    Past Surgical History  Procedure Laterality Date  . Insert / replace / remove pacemaker  02/14/2014  . Vaginal hysterectomy  1972  . Cataract extraction Bilateral 1982  . Cardiac catheterization  1995; 2005  . Skin cancer excision   2003     "face X 3" (02/16/2014)  . Temporary pacemaker insertion N/A 02/12/2014    Procedure: TEMPORARY PACEMAKER INSERTION;  Surgeon: Lorretta Harp, MD;  Location: [facility] CATH LAB;  Service: Cardiovascular;  Laterality: N/A;  . Permanent pacemaker insertion N/A 02/14/2014    Procedure: PERMANENT PACEMAKER INSERTION;  Surgeon: Deboraha Sprang, MD;  Location: [facility] CATH LAB;  Service: Cardiovascular;  Laterality: N/A;    Family History  Problem Relation Age of Onset  . Adopted: Yes  . Hypertension Mother   . Heart disease Mother     Social History  Substance Use Topics  . Smoking status: Never Smoker   . Smokeless tobacco: Never Used  . Alcohol Use: No    ROS Review of Systems  Constitutional: Negative for fever and chills.  Eyes: Negative for visual disturbance.  Respiratory: Negative for shortness of breath.   Cardiovascular: Negative for chest pain.  Gastrointestinal: Negative for abdominal pain and blood in stool.  Musculoskeletal: Positive for myalgias and arthralgias. Negative for back pain.  Skin: Negative for rash.  Allergic/Immunologic: Negative for immunocompromised state.  Hematological: Negative for adenopathy. Does not bruise/bleed easily.  Psychiatric/Behavioral: Negative for suicidal ideas and dysphoric mood.    Objective:   Today's Vitals: BP 121/74 mmHg  Pulse 76  Temp(Src)  98.4 F (36.9 C) (Oral)  Resp 16  Ht 5' 2"  (1.575 m)  Wt 162 lb (73.483 kg)  BMI 29.62 kg/m2  SpO2 98%  Physical Exam  Constitutional: She is oriented to person, place, and time. She appears well-developed and well-nourished. No distress.  HENT:  Head: Normocephalic and atraumatic.  Cardiovascular: Normal rate, regular rhythm, normal heart sounds and intact distal pulses.   Pulmonary/Chest: Effort normal and breath sounds normal.  Musculoskeletal: She exhibits no edema.       Legs: Neurological: She is alert and oriented to person, place, and time.  Skin: Skin is warm and dry. No  rash noted.  Stuck on hyperpigmented moles on L upper back and L arm   Psychiatric: She has a normal mood and affect.   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 Avoiding NSAID due to CKD P: Tylenol Exercise  Hip x-ray   Diabetes mellitus type 2, controlled ([place]) diet controlled diabetes  Hypertension A; BP well controlled Med: compliant P: refilled all meds     Outpatient Encounter Prescriptions as of 05/06/2015  Medication Sig  . aspirin EC 81 MG EC tablet Take 1 tablet (81 mg total) by mouth daily.  . carvedilol (COREG) 6.25 MG tablet Take 1 tablet (6.25 mg total) by mouth 2 (two) times daily with a meal.  . diphenhydramine-acetaminophen (TYLENOL PM) 25-500 MG TABS Take 1 tablet by mouth at bedtime as needed (for sleep).  . hydrochlorothiazide (MICROZIDE) 12.5 MG capsule Take 1 capsule (12.5 mg total) by mouth daily.  Marland Kitchen ipratropium (ATROVENT) 0.06 % nasal spray Place 2 sprays into both nostrils 4 (four) times daily.  Marland Kitchen losartan (COZAAR) 50 MG tablet Take 1 tablet (50 mg total) by mouth daily.  Marland Kitchen NIFEdipine (PROCARDIA-XL/ADALAT CC) 30 MG 24 hr tablet Take 1 tablet (30 mg total) by mouth daily.  Marland Kitchen olopatadine (PATANOL) 0.1 % ophthalmic solution Place 1 drop into both eyes 2 (two) times daily.  . pantoprazole (PROTONIX) 40 MG tablet Take 1 tablet (40 mg total) by mouth daily.  . [DISCONTINUED] carvedilol (COREG) 6.25 MG tablet Take 1 tablet (6.25 mg total) by mouth 2 (two) times daily with a meal.  . [DISCONTINUED] hydrochlorothiazide (MICROZIDE) 12.5 MG capsule Take 12.5 mg by mouth daily.  . [DISCONTINUED] hydrochlorothiazide (MICROZIDE) 12.5 MG capsule Take 1 capsule (12.5 mg total) by mouth daily.  . [DISCONTINUED] losartan (COZAAR) 50 MG tablet Take 1 tablet (50 mg total) by mouth daily.  .  [DISCONTINUED] NIFEdipine (PROCARDIA-XL/ADALAT CC) 30 MG 24 hr tablet Take 30 mg by mouth daily.  . [DISCONTINUED] pantoprazole (PROTONIX) 40 MG tablet Take 40 mg by mouth daily.  Marland Kitchen acetaminophen (TYLENOL 8 HOUR) 650 MG CR tablet Take 1 tablet (650 mg total) by mouth every 8 (eight) hours as needed for pain.  Marland Kitchen spironolactone (ALDACTONE) 25 MG tablet Take 1 tablet (25 mg total) by mouth every morning.  . [DISCONTINUED] furosemide (LASIX) 40 MG tablet Take 20 mg by mouth every other day. (1/2 of 40)  . [DISCONTINUED] furosemide (LASIX) 40 MG tablet Take 0.5 tablets (20 mg total) by mouth every other day. (1/2 of 40)  . [DISCONTINUED] omeprazole (PRILOSEC) 20 MG capsule Take 20 mg by mouth 2 (two) times daily.   . [DISCONTINUED] spironolactone (ALDACTONE) 25 MG tablet Take 1  tablet (25 mg total) by mouth every morning. (Patient not taking: Reported on 05/06/2015)   No facility-administered encounter medications on file as of 05/06/2015.    Follow-up: No Follow-up on file.    Boykin Nearing MD

## 2015-05-06 NOTE — Progress Notes (Signed)
Medicine refills  Complaining of mold on back and are  Hx skin cancer  Lt leg pain, Pain scale #5

## 2015-05-06 NOTE — Assessment & Plan Note (Signed)
diet controlled diabetes

## 2015-05-06 NOTE — Patient Instructions (Addendum)
Heather [name] was seen today for leg pain, medication refill and establish care.  Diagnoses and all orders for this visit:  Controlled type 2 diabetes mellitus with chronic kidney disease, without long-term current use of insulin, unspecified CKD stage (HCC) -     HgB A1c -     Glucose (CBG) -     Microalbumin/Creatinine Ratio, Urine -     COMPLETE METABOLIC PANEL WITH GFR  CKD (chronic kidney disease) stage 4, GFR 15-29 ml/min (HCC) -     COMPLETE METABOLIC PANEL WITH GFR  Essential hypertension -     spironolactone (ALDACTONE) 25 MG tablet; Take 1 tablet (25 mg total) by mouth every morning. -     pantoprazole (PROTONIX) 40 MG tablet; Take 1 tablet (40 mg total) by mouth daily. -     Discontinue: furosemide (LASIX) 40 MG tablet; Take 0.5 tablets (20 mg total) by mouth every other day. (1/2 of 40) -     Discontinue: hydrochlorothiazide (MICROZIDE) 12.5 MG capsule; Take 1 capsule (12.5 mg total) by mouth daily. -     NIFEdipine (PROCARDIA-XL/ADALAT CC) 30 MG 24 hr tablet; Take 1 tablet (30 mg total) by mouth daily. -     losartan (COZAAR) 50 MG tablet; Take 1 tablet (50 mg total) by mouth daily. -     carvedilol (COREG) 6.25 MG tablet; Take 1 tablet (6.25 mg total) by mouth 2 (two) times daily with a meal. -     hydrochlorothiazide (MICROZIDE) 12.5 MG capsule; Take 1 capsule (12.5 mg total) by mouth daily.  Right hip pain -     DG HIPS BILAT WITH PELVIS 2V; Future -     acetaminophen (TYLENOL 8 HOUR) 650 MG CR tablet; Take 1 tablet (650 mg total) by mouth every 8 (eight) hours as needed for pain.  exercise- yoga, recumbent bike and water aerobics   F/u in 6 weeks for hip pain F/u in 3 months for diabetes   Dr. Adrian Blackwater

## 2015-05-07 ENCOUNTER — Telehealth: Payer: Self-pay

## 2015-05-07 DIAGNOSIS — E875 Hyperkalemia: Secondary | ICD-10-CM | POA: Insufficient documentation

## 2015-05-07 LAB — MICROALBUMIN / CREATININE URINE RATIO
Creatinine, Urine: 225 mg/dL (ref 20–320)
Microalb Creat Ratio: 3 mcg/mg creat (ref <30)
Microalb, Ur: 0.6 mg/dL

## 2015-05-07 MED ORDER — FUROSEMIDE 40 MG PO TABS: 40.0000 mg | ORAL_TABLET | Freq: Every day | ORAL | Status: DC

## 2015-05-07 NOTE — Assessment & Plan Note (Signed)
A; patient with hyperkalemia on BMP P: D/c aldactone Start lasix 40 mg daily Come in for dose of kayexalate 15 gm PO x one  Repeat BMP on 05/09/15

## 2015-05-07 NOTE — Addendum Note (Signed)
Addended by: Boykin Nearing on: 05/07/2015 09:14 AM   Modules accepted: Orders, Medications

## 2015-05-07 NOTE — Telephone Encounter (Signed)
LVM to return call    Notes Recorded by Boykin Nearing, MD on 05/07/2015 at 9:05 AM Elevated potassium with CKD patient taking aldactone Stop aldactone  Start lasix  Please come in for kayexalate to bind potassium

## 2015-05-08 ENCOUNTER — Ambulatory Visit: Payer: Self-pay | Attending: Family Medicine | Admitting: Pharmacist

## 2015-05-08 ENCOUNTER — Other Ambulatory Visit: Payer: Self-pay | Admitting: *Deleted

## 2015-05-08 DIAGNOSIS — E875 Hyperkalemia: Secondary | ICD-10-CM

## 2015-05-08 DIAGNOSIS — I10 Essential (primary) hypertension: Secondary | ICD-10-CM

## 2015-05-08 DIAGNOSIS — M25551 Pain in right hip: Secondary | ICD-10-CM

## 2015-05-08 MED ORDER — SODIUM POLYSTYRENE SULFONATE 15 GM/60ML PO SUSP: 15.0000 g | Freq: Once | ORAL | Status: DC

## 2015-05-08 MED ORDER — HYDROCHLOROTHIAZIDE 12.5 MG PO CAPS: 12.5000 mg | ORAL_CAPSULE | Freq: Every day | ORAL | Status: DC

## 2015-05-08 MED ORDER — ASPIRIN 81 MG PO TBEC: 81.0000 mg | DELAYED_RELEASE_TABLET | Freq: Every day | ORAL | Status: DC

## 2015-05-08 MED ORDER — ACETAMINOPHEN ER 650 MG PO TBCR: 650.0000 mg | EXTENDED_RELEASE_TABLET | Freq: Three times a day (TID) | ORAL | Status: DC | PRN

## 2015-05-08 MED ORDER — ASPIRIN 81 MG PO TBEC: 81.0000 mg | DELAYED_RELEASE_TABLET | Freq: Every day | ORAL | Status: AC

## 2015-05-08 MED ORDER — NIFEDIPINE ER 30 MG PO TB24: 30.0000 mg | ORAL_TABLET | Freq: Every day | ORAL | Status: DC

## 2015-05-08 MED ORDER — CARVEDILOL 6.25 MG PO TABS: 6.2500 mg | ORAL_TABLET | Freq: Two times a day (BID) | ORAL | Status: DC

## 2015-05-08 MED ORDER — LOSARTAN POTASSIUM 50 MG PO TABS: 50.0000 mg | ORAL_TABLET | Freq: Every day | ORAL | Status: DC

## 2015-05-08 MED ORDER — PANTOPRAZOLE SODIUM 40 MG PO TBEC: 40.0000 mg | DELAYED_RELEASE_TABLET | Freq: Every day | ORAL | Status: DC

## 2015-05-08 NOTE — Progress Notes (Signed)
Patient came to clinic today for administration of sodium polystyrene (Kayexalate) for hyperkalemia.  Per Dr. Adrian Blackwater, patient was prescribed 15 g (60 ml) and this was dispensed from the pharmacy.  Patient drank the entire 60 ml in my presence with no issues or adverse reactions.  Patient to follow up with Dr. Adrian Blackwater as directed.

## 2015-05-08 NOTE — Patient Instructions (Signed)
Sodium Polystyrene Sulfonate powder for reconstitution to suspension Qu es este medicamento? El POLIESTIRENO elimina el potasio en el cuerpo al [employer] intestinales. Se utiliza para tratar el exceso de potasio en el cuerpo. Este medicamento puede ser utilizado para otros usos; si tiene alguna pregunta consulte con su proveedor de atencin mdica o con su farmacutico. Qu le debo informar a mi profesional de la salud antes de tomar este medicamento? Necesita saber si usted presenta alguno de los siguientes problemas o situaciones: -bloqueo intestinal -estreimiento -heces secos y duros que no pasan por el recto (impactacin) -enfermedad cardiaca -alta presin sangunea -bajos niveles de calcio o de potasio en la sangre -dieta con restriccin de sodio -problemas con los intestinos despus de operacin -una reaccin alrgica o inusual al poliestireno, a otros medicamentos, alimentos, colorantes o conservantes -si est embarazada o buscando quedar embarazada -si est amamantando a un beb Cmo debo utilizar este medicamento? Generalmente este medicamento se administra en un hospital o en un entorno clnico. Se puede administrar por va oral o rectal. Siga las instrucciones de la etiqueta del medicamento. Debe mezclar el polvo con agua, jarabe u otro lquido antes de usar. Use la mezcla durante 24 horas. Use su dosis a intervalos regulares. No use su medicamento con una frecuencia mayor a la indicada. Hable con su pediatra para informarse acerca del uso de este medicamento en nios. Puede requerir atencin especial. Sobredosis: Pngase en contacto inmediatamente con un centro toxicolgico o una sala de urgencia si usted cree que haya tomado demasiado medicamento. ATENCIN: [employer] es solo para usted. No comparta este medicamento con nadie. Qu sucede si me olvido de una dosis? Si olvida una dosis, sela lo antes posible. Si es casi la hora de la prxima dosis, use slo esa  dosis. No use dosis adicionales o dobles. Qu puede interactuar con este medicamento? No tome esta medicina con ninguno de los siguientes medicamentos: -sorbitol, a menos que su mdico se lo indique Esta medicina tambin puede interactuar con los siguientes medicamentos: -anticidos con aluminio o magnesio -digoxina -laxantes con aluminio o magnesio -litio -tiroxina [place] otros medicamentos oral 6 horas antes o 6 horas despus de Fish farm manager, para evitar la disminucin del absorcin. Puede ser que esta lista no menciona todas las posibles interacciones. Informe a su profesional de [employer]	[place] de [employer] productos a base de hierbas, medicamentos de [place] o suplementos nutritivos que est tomando. Si usted fuma, consume bebidas alcohlicas o si utiliza drogas ilegales, indqueselo tambin a su profesional de [employer]	[place]. Algunas sustancias pueden interactuar con su medicamento. A qu debo estar atento al usar [employer]? Visite a su mdico para chequear su evolucin peridicamente. Necesitar realizarse [employer] de laboratorio y otros anlisis mientras est tomando este medicamento. Puede ser necesario seguir una dieta especial mientras est tomando este medicamento. Consulte con su profesional de la [employer] alimentos y vitaminas que toma. Consulte a su mdico acerca de que hacer si experimenta estreimiento. Qu efectos secundarios puedo tener al [employer] este medicamento? Efectos secundarios que debe informar a su mdico o a Barrister's clerk de la salud tan pronto como sea posible: -Chief of Staff como erupcin cutnea, picazn o urticarias, hinchazn de la cara, labios o lengua -heces de color negro o de aspecto alquitranado -dolor en el recto o en la parte baja del estmago -signos y sntomas de bajo nivel de magnesio, tales como calambres, dolores o debilidad musculares; temblores; convulsiones; o pulso cardiaco rpido, irregular -signos y sntomas de bajo  nivel de  potasio, tales como calambres o dolores musculares; dolor en el pecho; mareos; sensacin de desmayos o aturdimiento, cadas; palpitaciones; problemas respiratorios; o pulso cardiaco rpido, irregular Efectos secundarios que, por lo general, no requieren atencin mdica (debe informarlos a su mdico o a su profesional de la salud si persisten o si son molestos): -estreimiento -diarrea -prdida del apetito -Higher education careers adviser -vmito Puede ser que esta lista no menciona todos los posibles efectos secundarios. Comunquese a su mdico por asesoramiento mdico [employer] Usted puede informar los efectos secundarios a la FDA por telfono al 1-800-FDA-1088. Dnde debo guardar mi medicina? Mantngala fuera del alcance de los nios. Gurdela a temperatura ambiente de entre 15 y 12 grados C (41 y 54 grados F). Use cualquier mezcla antes de que transcurran 24 horas o deschela. Deseche todo el medicamento que no haya utilizado, despus de la fecha de vencimiento. ATENCIN: Este folleto es un resumen. Puede ser que no cubra toda la posible informacin. Si usted tiene preguntas acerca de esta medicina, consulte con su mdico, su farmacutico o su profesional de Technical sales engineer.    2016, Elsevier/Gold Standard. (2014-08-29 00:00:00)

## 2015-05-13 ENCOUNTER — Ambulatory Visit: Payer: Self-pay | Admitting: Cardiology

## 2015-05-15 ENCOUNTER — Ambulatory Visit: Payer: Self-pay | Attending: Family Medicine

## 2015-05-15 DIAGNOSIS — E875 Hyperkalemia: Secondary | ICD-10-CM

## 2015-05-15 LAB — COMPLETE METABOLIC PANEL WITH GFR
ALT: 44 U/L — ABNORMAL HIGH (ref 6–29)
AST: 32 U/L (ref 10–35)
Albumin: 3.9 g/dL (ref 3.6–5.1)
Alkaline Phosphatase: 210 U/L — ABNORMAL HIGH (ref 33–130)
BUN: 46 mg/dL — ABNORMAL HIGH (ref 7–25)
CO2: 23 mmol/L (ref 20–31)
Calcium: 9.9 mg/dL (ref 8.6–10.4)
Chloride: 106 mmol/L (ref 98–110)
Creat: 1.56 mg/dL — ABNORMAL HIGH (ref 0.60–0.93)
GFR, Est African American: 37 mL/min — ABNORMAL LOW (ref >=60)
GFR, Est Non African American: 32 mL/min — ABNORMAL LOW (ref >=60)
Glucose, Bld: 100 mg/dL — ABNORMAL HIGH (ref 65–99)
Potassium: 5.1 mmol/L (ref 3.5–5.3)
Sodium: 139 mmol/L (ref 135–146)
Total Bilirubin: 0.5 mg/dL (ref 0.2–1.2)
Total Protein: 7.4 g/dL (ref 6.1–8.1)

## 2015-05-16 ENCOUNTER — Telehealth: Payer: Self-pay | Admitting: *Deleted

## 2015-05-16 NOTE — Telephone Encounter (Signed)
Date of birth verified by pt  Results given- potassium level down to 5.1 Continue current tx plan Information given in Spanish

## 2015-05-16 NOTE — Telephone Encounter (Signed)
-----   Message from Boykin Nearing, MD sent at 05/16/2015  9:19 AM EDT ----- Repeat potassium level is down to 5.1 continue current treatment plan

## 2015-05-21 ENCOUNTER — Encounter: Payer: Self-pay | Admitting: *Deleted

## 2015-05-31 ENCOUNTER — Ambulatory Visit (INDEPENDENT_AMBULATORY_CARE_PROVIDER_SITE_OTHER): Payer: No Typology Code available for payment source | Admitting: Cardiology

## 2015-05-31 ENCOUNTER — Encounter: Payer: Self-pay | Admitting: Cardiology

## 2015-05-31 VITALS — BP 110/60 | HR 79 | Ht 62.0 in | Wt 164.4 lb

## 2015-05-31 DIAGNOSIS — I5033 Acute on chronic diastolic (congestive) heart failure: Secondary | ICD-10-CM

## 2015-05-31 DIAGNOSIS — Z950 Presence of cardiac pacemaker: Secondary | ICD-10-CM

## 2015-05-31 DIAGNOSIS — R001 Bradycardia, unspecified: Secondary | ICD-10-CM

## 2015-05-31 NOTE — Patient Instructions (Addendum)
Medication Instructions:  Your physician recommends that you continue on your current medications as directed. Please refer to the Current Medication list given to you today.  Labwork: NONE  Testing/Procedures: NONE  Follow-Up: Your physician wants you to follow-up in: [place] will receive a reminder letter in the mail two months in advance. If you don't receive a letter, please call our office to schedule the follow-up appointment.  Your physician recommends that you schedule a follow-up appointment in: soon with Dr Gari Crown need to drink more water   If you need a refill on your cardiac medications before your next appointment, please call your pharmacy.

## 2015-05-31 NOTE — Progress Notes (Signed)
Cardiology Office Note   Date:  05/31/2015   ID:  Heather Heather [name], DOB 13-Oct-1940, MRN [id number]  PCP:  Heather Ends, MD  Cardiologist: Darlin Coco MD  Chief Complaint  Heather [name] presents with  . Coronary Artery Disease      History of Present Illness: Heather Heather [name] is a 74 y.o. female who presents for a six-month follow-up visit  This pleasant 74 year old woman from [place] is seen for a office visit. [facility]. She presented on 01/29/14 with weakness and junctional bradycardia and was found to be digitalis toxic with a digoxin level of 1.9. Her digoxin was stopped. Her heart rate gradually improved and she was discharged home. For her high blood pressure she was on carvedilol. She had an echocardiogram on 01/30/14 which showed an ejection fraction of 54-65%, grade 2 diastolic dysfunction, moderate aortic stenosis, and enlarged left atrium at 57 mm. Heather Heather [name] was readmitted on 02/12/14 with marked sinus bradycardia. Her heart rate was in Heather 30s and she required a temporary pacing wire. Her carvedilol was stopped but her heart rate did not improve. Therefore on 02/14/14 she underwent placement of a Medtronic dual-chamber pacemaker by Dr. Caryl Comes. Since her pacemaker she has felt well.  She has had no spells of dizziness or weakness.  She denies any chest pain or shortness of breath or peripheral edema. She has been on long-term spironolactone for her previous left ventricular dysfunction. Her PCP has been monitoring her potassium levels and her renal function.   Past Medical History  Diagnosis Date  . CHF (congestive heart failure) ([place])   . Hypertension   . Murmur, cardiac   . Pulmonary hypertension ([place])   . Crohn's disease ([place])   . 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 Sinus bradycardia 01/29/2014  . High cholesterol   . OSA on CPAP   . Type II diabetes mellitus ([place])   . GERD (gastroesophageal reflux disease)     . Migraine     "1-2 times/yr" (02/16/2014)  . Chronic back pain   . Skin cancer 2003    "3 cut off face" (02/16/2014)    Past Surgical History  Procedure Laterality Date  . Insert / replace / remove pacemaker  02/14/2014  . Vaginal hysterectomy  1972  . Cataract extraction Bilateral 1982  . Cardiac catheterization  1995; 2005  . Skin cancer excision  2003     "face X 3" (02/16/2014)  . Temporary pacemaker insertion N/A 02/12/2014    Procedure: TEMPORARY PACEMAKER INSERTION;  Surgeon: Lorretta Harp, MD;  Location: [facility] CATH LAB;  Service: Cardiovascular;  Laterality: N/A;  . Permanent pacemaker insertion N/A 02/14/2014    Procedure: PERMANENT PACEMAKER INSERTION;  Surgeon: Deboraha Sprang, MD;  Location: [facility] CATH LAB;  Service: Cardiovascular;  Laterality: N/A;     Current Outpatient Prescriptions  Medication Sig Dispense Refill  . acetaminophen (TYLENOL 8 HOUR) 650 MG CR tablet Take 1 tablet (650 mg total) by mouth every 8 (eight) hours as needed for pain. 90 tablet 1  . aspirin 81 MG EC tablet Take 1 tablet (81 mg total) by mouth daily. 30 tablet 5  . carvedilol (COREG) 6.25 MG tablet Take 1 tablet (6.25 mg total) by mouth 2 (two) times daily with a meal. 60 tablet 3  . diphenhydramine-acetaminophen (TYLENOL PM) 25-500 MG TABS Take 1 tablet by mouth at bedtime as needed (for sleep).    . hydrochlorothiazide (MICROZIDE) 12.5 MG capsule Take 12.5 mg  by mouth daily.    Marland Kitchen ipratropium (ATROVENT) 0.06 % nasal spray Place 2 sprays into both nostrils 4 (four) times daily. 15 mL 12  . losartan (COZAAR) 50 MG tablet Take 1 tablet (50 mg total) by mouth daily. 30 tablet 5  . NIFEdipine (PROCARDIA-XL/ADALAT CC) 30 MG 24 hr tablet Take 1 tablet (30 mg total) by mouth daily. 30 tablet 5  . olopatadine (PATANOL) 0.1 % ophthalmic solution Place 1 drop into both eyes 2 (two) times daily. 5 mL 1  . pantoprazole (PROTONIX) 40 MG tablet Take 1 tablet (40 mg total) by mouth daily. 30 tablet 5  .  spironolactone (ALDACTONE) 25 MG tablet Take 25 mg by mouth daily.  0   No current facility-administered medications for this visit.    Allergies:   Review of Heather [name]'s allergies indicates no known allergies.    Social History:  Heather Heather [name]  reports that she has never smoked. She has never used smokeless tobacco. She reports that she does not drink alcohol or use illicit drugs.   Family History:  Heather Heather [name]'s family history includes Heart disease in her mother; Hypertension in her mother. She was adopted.    ROS:  Please see Heather history of present illness.   Otherwise, review of systems are positive for none.   All other systems are reviewed and negative.    PHYSICAL EXAM: VS:  BP 110/60 mmHg  Pulse 79  Ht 5' 2"  (1.575 m)  Wt 164 lb 6.4 oz (74.571 kg)  BMI 30.06 kg/m2 , BMI Body mass index is 30.06 kg/(m^2). GEN: Well nourished, well developed, in no acute distress HEENT: normal Neck: no JVD, carotid bruits, or masses Cardiac: RRR; no murmurs, rubs, or gallops,no edema  Respiratory:  clear to auscultation bilaterally, normal work of breathing GI: soft, nontender, nondistended, + BS MS: no deformity or atrophy Skin: warm and dry, no rash Neuro:  Strength and sensation are intact Psych: euthymic mood, full affect   EKG:  EKG is ordered today. Heather ekg ordered today demonstrates atrial paced rhythm.  No ischemic changes.   Recent Labs: 05/15/2015: ALT 44*; BUN 46*; Creat 1.56*; Potassium 5.1; Sodium 139    Lipid Panel No results found for: CHOL, TRIG, HDL, CHOLHDL, VLDL, LDLCALC, LDLDIRECT    Wt Readings from Last 3 Encounters:  05/31/15 164 lb 6.4 oz (74.571 kg)  05/06/15 162 lb (73.483 kg)  09/20/14 159 lb 9.6 oz (72.394 kg)        ASSESSMENT AND PLAN:  1. sick sinus syndrome with symptomatic sinus bradycardia, now with functioning dual-chamber pacemaker inserted 02/14/14 (Medtronic) 2. diabetes mellitus 3. essential hypertension 4. chronic diastolic heart  failure   Current medicines are reviewed at length with Heather Heather [name] today.  Heather Heather [name] does not have concerns regarding medicines.  Heather following changes have been made:  no change  Labs/ tests ordered today include:   Orders Placed This Encounter  Procedures  . EKG 12-Lead     Disposition: Heather Heather [name] will continue current medication.  Her PCP is following her electrolytes.  She needs to drink more water to help prevent hyperkalemia while on spironolactone and ARB.  She will return in one year for follow-up office visit with Dr. Marlou Porch  Signed, Darlin Coco MD 05/31/2015 1:00 PM    [address], [place], [place]  [address] Phone: [phone]; Fax: [phone]

## 2015-06-04 NOTE — Progress Notes (Signed)
Patient Care Team: Boykin Nearing, MD as PCP - General (Family Medicine)   HPI  Heather [name] is a 74 y.o. female Seen in follow-up for sinus node dysfunction for which she underwent pacemaker implantation 7/15  The patient denies chest pain, shortness of breath, nocturnal dyspnea, orthopnea or peripheral edema.  There have been no palpitations, lightheadedness or syncope.   She still promises me a dance at Christmas as well as food and drink  Had hyperkalemia in mid October with with a modest elevation in creatinine from a baseline of 1.8--2.1. She got some kind of medication, presume it was Kayexalate and a repeat was 5.1.  DATE TEST    7/15 echo   EF 60            Past Medical History  Diagnosis Date  . CHF (congestive heart failure) ([place])   . Hypertension   . Murmur, cardiac   . Pulmonary hypertension ([place])   . Crohn's disease ([place])   . 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 Sinus bradycardia 01/29/2014  . High cholesterol   . OSA on CPAP   . Type II diabetes mellitus ([place])   . GERD (gastroesophageal reflux disease)   . Migraine     "1-2 times/yr" (02/16/2014)  . Chronic back pain   . Skin cancer 2003    "3 cut off face" (02/16/2014)    Past Surgical History  Procedure Laterality Date  . Insert / replace / remove pacemaker  02/14/2014  . Vaginal hysterectomy  1972  . Cataract extraction Bilateral 1982  . Cardiac catheterization  1995; 2005  . Skin cancer excision  2003     "face X 3" (02/16/2014)  . Temporary pacemaker insertion N/A 02/12/2014    Procedure: TEMPORARY PACEMAKER INSERTION;  Surgeon: Lorretta Harp, MD;  Location: [facility] C[facility] LAB;  Service: Cardiovascular;  Laterality: N/A;  . Permanent pacemaker insertion N/A 02/14/2014    Procedure: PERMANENT PACEMAKER INSERTION;  Surgeon: Deboraha Sprang, MD;  Location: [facility] CATH LAB;  Service: Cardiovascular;  Laterality: N/A;    Current Outpatient Prescriptions  Medication Sig Dispense Refill  . acetaminophen  (TYLENOL 8 HOUR) 650 MG CR tablet Take 1 tablet (650 mg total) by mouth every 8 (eight) hours as needed for pain. 90 tablet 1  . aspirin 81 MG EC tablet Take 1 tablet (81 mg total) by mouth daily. 30 tablet 5  . carvedilol (COREG) 6.25 MG tablet Take 1 tablet (6.25 mg total) by mouth 2 (two) times daily with a meal. 60 tablet 3  . diphenhydramine-acetaminophen (TYLENOL PM) 25-500 MG TABS Take 1 tablet by mouth at bedtime as needed (for sleep).    . furosemide (LASIX) 40 MG tablet Take 1 tablet by mouth daily.  2  . hydrochlorothiazide (MICROZIDE) 12.5 MG capsule Take 12.5 mg by mouth daily.    Marland Kitchen ipratropium (ATROVENT) 0.06 % nasal spray Place 2 sprays into both nostrils 4 (four) times daily. 15 mL 12  . losartan (COZAAR) 50 MG tablet Take 1 tablet (50 mg total) by mouth daily. 30 tablet 5  . NIFEdipine (PROCARDIA-XL/ADALAT CC) 30 MG 24 hr tablet Take 1 tablet (30 mg total) by mouth daily. 30 tablet 5  . olopatadine (PATANOL) 0.1 % ophthalmic solution Place 1 drop into both eyes 2 (two) times daily. 5 mL 1  . pantoprazole (PROTONIX) 40 MG tablet Take 1 tablet (40 mg total) by mouth daily. 30 tablet 5  . spironolactone (ALDACTONE) 25 MG tablet Take 25 mg  by mouth daily.  0   No current facility-administered medications for this visit.    No Known Allergies  Review of Systems negative except from HPI and PMH  Physical Exam BP 100/70 mmHg  Pulse 78  Ht 5' 2"  (1.575 m)  Wt 168 lb 9.6 oz (76.476 kg)  BMI 30.83 kg/m2  SpO2 98% Well developed and well nourished in no acute distress HENT normal E scleral and icterus clear Neck Supple JVP flat; carotids brisk and full Clear to ausculation Device pocket well healed; without hematoma or erythema.  There is no tethering Regular rate and rhythm, no murmurs gallops or rub Soft with active bowel sounds No clubbing cyanosis  Edema Alert and oriented, grossly normal motor and sensory function Skin Warm and Dry  ECG Atrial pacing  @79  21/08/35  Assessment and  Plan  Sinus node dysfunction  hypertension  Pacemaker implantation-St. Medtronic  The patient's device was interrogated and the information was fully reviewed.  The device was reprogrammed to  Maximize longevity  Device function is normal with good heart rate excursion  \  hyperkalemia is worrisome in the context of her renal function. We will discontinue Aldactone. Her blood pressure is sufficiently low that I think we should be okay. We will recheck a metabolic profile in 2 weeks time.

## 2015-06-05 ENCOUNTER — Encounter: Payer: Self-pay | Admitting: Internal Medicine

## 2015-06-05 ENCOUNTER — Ambulatory Visit (INDEPENDENT_AMBULATORY_CARE_PROVIDER_SITE_OTHER): Payer: No Typology Code available for payment source | Admitting: Internal Medicine

## 2015-06-05 VITALS — BP 100/70 | HR 78 | Ht 62.0 in | Wt 168.6 lb

## 2015-06-05 DIAGNOSIS — N183 Chronic kidney disease, stage 3 unspecified: Secondary | ICD-10-CM

## 2015-06-05 DIAGNOSIS — Z950 Presence of cardiac pacemaker: Secondary | ICD-10-CM

## 2015-06-05 DIAGNOSIS — R001 Bradycardia, unspecified: Secondary | ICD-10-CM

## 2015-06-05 DIAGNOSIS — E875 Hyperkalemia: Secondary | ICD-10-CM

## 2015-06-05 LAB — CUP PACEART INCLINIC DEVICE CHECK
Battery Remaining Longevity: 103 mo
Battery Voltage: 3.02 V
Brady Statistic AP VP Percent: 0.06 %
Brady Statistic AP VS Percent: 98.85 %
Brady Statistic AS VP Percent: 0 %
Brady Statistic AS VS Percent: 1.09 %
Brady Statistic RA Percent Paced: 98.91 %
Brady Statistic RV Percent Paced: 0.06 %
Date Time Interrogation Session: 20161116093624
Implantable Lead Implant Date: 20150729
Implantable Lead Implant Date: 20150729
Implantable Lead Location: 753859
Implantable Lead Location: 753860
Implantable Lead Model: 5076
Implantable Lead Model: 5076
Lead Channel Impedance Value: 380 Ohm
Lead Channel Impedance Value: 494 Ohm
Lead Channel Impedance Value: 513 Ohm
Lead Channel Impedance Value: 532 Ohm
Lead Channel Pacing Threshold Amplitude: 0.75 V
Lead Channel Pacing Threshold Amplitude: 0.75 V
Lead Channel Pacing Threshold Pulse Width: 0.4 ms
Lead Channel Pacing Threshold Pulse Width: 0.4 ms
Lead Channel Sensing Intrinsic Amplitude: 11.375 mV
Lead Channel Sensing Intrinsic Amplitude: 2.125 mV
Lead Channel Setting Pacing Amplitude: 1.75 V
Lead Channel Setting Pacing Amplitude: 2 V
Lead Channel Setting Pacing Pulse Width: 0.4 ms
Lead Channel Setting Sensing Sensitivity: 2 mV

## 2015-06-05 NOTE — Patient Instructions (Signed)
Medication Instructions:  Your physician has recommended you make the following change in your medication: Stop Aldactone   Labwork: Your physician recommends that you return for lab work in: 2 weeks--BMP.  Scheduled for November 30,2016.  The lab opens at 7:30 AM.    Testing/Procedures: none  Follow-Up: Remote monitoring is used to monitor your Pacemaker of ICD from home. This monitoring reduces the number of office visits required to check your device to one time per year. It allows [place] to keep an eye on the functioning of your device to ensure it is working properly. You are scheduled for a device check from home on December 15,2016. You may send your transmission at any time that day. If you have a wireless device, the transmission will be sent automatically. After your physician reviews your transmission, you will receive a postcard with your next transmission date.  Your physician wants you to follow-up in: 12 months.  You will receive a reminder letter in the mail two months in advance. If you don't receive a letter, please call our office to schedule the follow-up appointment.   Any Other Special Instructions Will Be Listed Below (If Applicable).     If you need a refill on your cardiac medications before your next appointment, please call your pharmacy.

## 2015-06-19 ENCOUNTER — Other Ambulatory Visit (INDEPENDENT_AMBULATORY_CARE_PROVIDER_SITE_OTHER): Payer: No Typology Code available for payment source | Admitting: *Deleted

## 2015-06-19 DIAGNOSIS — N183 Chronic kidney disease, stage 3 unspecified: Secondary | ICD-10-CM

## 2015-06-19 DIAGNOSIS — R001 Bradycardia, unspecified: Secondary | ICD-10-CM

## 2015-06-19 DIAGNOSIS — E875 Hyperkalemia: Secondary | ICD-10-CM

## 2015-06-19 DIAGNOSIS — Z950 Presence of cardiac pacemaker: Secondary | ICD-10-CM

## 2015-06-19 LAB — BASIC METABOLIC PANEL
BUN: 44 mg/dL — ABNORMAL HIGH (ref 7–25)
CO2: 23 mmol/L (ref 20–31)
Calcium: 9.3 mg/dL (ref 8.6–10.4)
Chloride: 106 mmol/L (ref 98–110)
Creat: 1.76 mg/dL — ABNORMAL HIGH (ref 0.60–0.93)
Glucose, Bld: 88 mg/dL (ref 65–99)
Potassium: 4.4 mmol/L (ref 3.5–5.3)
Sodium: 136 mmol/L (ref 135–146)

## 2015-07-03 ENCOUNTER — Emergency Department (HOSPITAL_COMMUNITY)
Admission: EM | Admit: 2015-07-03 | Discharge: 2015-07-03 | Disposition: A | Discharge status: Home / Self Care | Payer: No Typology Code available for payment source | Source: Home / Self Care | Attending: Family Medicine | Admitting: Family Medicine

## 2015-07-03 ENCOUNTER — Encounter (HOSPITAL_COMMUNITY): Payer: Self-pay | Admitting: *Deleted

## 2015-07-03 ENCOUNTER — Other Ambulatory Visit (HOSPITAL_COMMUNITY)
Admission: RE | Admit: 2015-07-03 | Discharge: 2015-07-03 | Disposition: A | Discharge status: Home / Self Care | Payer: No Typology Code available for payment source | Source: Ambulatory Visit | Attending: Family Medicine | Admitting: Family Medicine

## 2015-07-03 DIAGNOSIS — R319 Hematuria, unspecified: Secondary | ICD-10-CM | POA: Insufficient documentation

## 2015-07-03 LAB — POCT URINALYSIS DIP (DEVICE)
Bilirubin Urine: NEGATIVE
Glucose, UA: NEGATIVE mg/dL
Hgb urine dipstick: NEGATIVE
Ketones, ur: NEGATIVE mg/dL
Nitrite: NEGATIVE
Protein, ur: NEGATIVE mg/dL
Specific Gravity, Urine: 1.015 (ref 1.005–1.030)
Urobilinogen, UA: 0.2 mg/dL (ref 0.0–1.0)
pH: 6 (ref 5.0–8.0)

## 2015-07-03 NOTE — Discharge Instructions (Signed)
Drink plenty of fluids and see specialist as discussed.

## 2015-07-03 NOTE — ED Notes (Signed)
Pt  Reports  Low abd  Pain  With  Hematuria      With  Symptoms  X  1  Month  Getting  Worse

## 2015-07-03 NOTE — ED Provider Notes (Signed)
CSN: [id number]     Arrival date & time 07/03/15  1301 History   First MD Initiated Contact with Patient 07/03/15 1312     Chief Complaint  Patient presents with  . Urinary Tract Infection   (Consider location/radiation/quality/duration/timing/severity/associated sxs/prior Treatment) Patient is a 74 y.o. female presenting with urinary tract infection. The history is provided by the patient and a relative. The history is limited by a language barrier. Language interpreter used: relative -daughter trans.  Urinary Tract Infection Pain quality:  Sharp Pain severity:  Mild Onset quality:  Gradual Duration:  1 month Progression:  Waxing and waning Chronicity:  New Recent urinary tract infections: no   Relieved by:  None tried Worsened by:  Nothing tried Ineffective treatments:  None tried Urinary symptoms: hematuria   Urinary symptoms: no frequent urination   Associated symptoms: no abdominal pain, no fever, no nausea and no vomiting   Risk factors: no recurrent urinary tract infections     Past Medical History  Diagnosis Date  . CHF (congestive heart failure) ([place])   . Hypertension   . Murmur, cardiac   . Pulmonary hypertension ([place])   . Crohn's disease ([place])   . 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 Sinus bradycardia 01/29/2014  . High cholesterol   . OSA on CPAP   . Type II diabetes mellitus ([place])   . GERD (gastroesophageal reflux disease)   . Migraine     "1-2 times/yr" (02/16/2014)  . Chronic back pain   . Skin cancer 2003    "3 cut off face" (02/16/2014)   Past Surgical History  Procedure Laterality Date  . Insert / replace / remove pacemaker  02/14/2014  . Vaginal hysterectomy  1972  . Cataract extraction Bilateral 1982  . Cardiac catheterization  1995; 2005  . Skin cancer excision  2003     "face X 3" (02/16/2014)  . Temporary pacemaker insertion N/A 02/12/2014    Procedure: TEMPORARY PACEMAKER INSERTION;  Surgeon: Lorretta Harp, MD;  Location: [facility] CATH LAB;  Service:  Cardiovascular;  Laterality: N/A;  . Permanent pacemaker insertion N/A 02/14/2014    Procedure: PERMANENT PACEMAKER INSERTION;  Surgeon: Deboraha Sprang, MD;  Location: [facility] CATH LAB;  Service: Cardiovascular;  Laterality: N/A;   Family History  Problem Relation Age of Onset  . Adopted: Yes  . Hypertension Mother   . Heart disease Mother    Social History  Substance Use Topics  . Smoking status: Never Smoker   . Smokeless tobacco: Never Used  . Alcohol Use: No   OB History    No data available     Review of Systems  Constitutional: Negative.  Negative for fever.  Gastrointestinal: Negative for nausea, vomiting and abdominal pain.  Genitourinary: Positive for hematuria. Negative for dysuria and pelvic pain.  Musculoskeletal: Positive for back pain.  All other systems reviewed and are negative.   Allergies  Review of patient's allergies indicates no known allergies.  Home Medications   Prior to Admission medications   Medication Sig Start Date End Date Taking? Authorizing Provider  acetaminophen (TYLENOL 8 HOUR) 650 MG CR tablet Take 1 tablet (650 mg total) by mouth every 8 (eight) hours as needed for pain. 05/08/15   Boykin Nearing, MD  aspirin 81 MG EC tablet Take 1 tablet (81 mg total) by mouth daily. 05/08/15   Boykin Nearing, MD  carvedilol (COREG) 6.25 MG tablet Take 1 tablet (6.25 mg total) by mouth 2 (two) times daily with a meal. 05/08/15   Josalyn  Funches, MD  diphenhydramine-acetaminophen (TYLENOL PM) 25-500 MG TABS Take 1 tablet by mouth at bedtime as needed (for sleep).    Historical Provider, MD  furosemide (LASIX) 40 MG tablet Take 1 tablet by mouth daily. 05/07/15   Historical Provider, MD  hydrochlorothiazide (MICROZIDE) 12.5 MG capsule Take 12.5 mg by mouth daily.    Historical Provider, MD  ipratropium (ATROVENT) 0.06 % nasal spray Place 2 sprays into both nostrils 4 (four) times daily. 02/03/15   Billy Fischer, MD  losartan (COZAAR) 50 MG tablet Take 1 tablet  (50 mg total) by mouth daily. 05/08/15   Josalyn Funches, MD  NIFEdipine (PROCARDIA-XL/ADALAT CC) 30 MG 24 hr tablet Take 1 tablet (30 mg total) by mouth daily. 05/08/15   Josalyn Funches, MD  olopatadine (PATANOL) 0.1 % ophthalmic solution Place 1 drop into both eyes 2 (two) times daily. 02/03/15   Billy Fischer, MD  pantoprazole (PROTONIX) 40 MG tablet Take 1 tablet (40 mg total) by mouth daily. 05/08/15   Boykin Nearing, MD   Meds Ordered and Administered this Visit  Medications - No data to display  BP 115/68 mmHg  Pulse 82  Temp(Src) 97.9 F (36.6 C) (Oral)  Resp 16  SpO2 97% No data found.   Physical Exam  Constitutional: She is oriented to person, place, and time. She appears well-developed and well-nourished. No distress.  Abdominal: Soft. Bowel sounds are normal. She exhibits no mass. There is no tenderness.  Neurological: She is alert and oriented to person, place, and time.  Skin: Skin is warm and dry.  Nursing note and vitals reviewed.   ED Course  Procedures (including critical care time)  Labs Review Labs Reviewed  POCT URINALYSIS DIP (DEVICE) - Abnormal; Notable for the following:    Leukocytes, UA SMALL (*)    All other components within normal limits  URINE CULTURE    Imaging Review No results found.   Visual Acuity Review  Right Eye Distance:   Left Eye Distance:   Bilateral Distance:    Right Eye Near:   Left Eye Near:    Bilateral Near:         MDM   1. Hematuria, undiagnosed cause        Billy Fischer, MD 07/03/15 1409

## 2015-07-06 LAB — URINE CULTURE
Culture: 20000
Special Requests: NORMAL

## 2015-07-08 NOTE — ED Notes (Signed)
Discussed lab findings w Dr Juventino Slovak; no treatment required

## 2015-07-09 ENCOUNTER — Encounter: Payer: Self-pay | Admitting: Physician Assistant

## 2015-07-09 ENCOUNTER — Other Ambulatory Visit: Payer: Self-pay | Admitting: Cardiology

## 2015-07-09 ENCOUNTER — Ambulatory Visit (INDEPENDENT_AMBULATORY_CARE_PROVIDER_SITE_OTHER): Payer: No Typology Code available for payment source | Admitting: Physician Assistant

## 2015-07-09 ENCOUNTER — Telehealth: Payer: Self-pay | Admitting: *Deleted

## 2015-07-09 VITALS — BP 118/76 | HR 75 | Ht 62.0 in | Wt 172.2 lb

## 2015-07-09 DIAGNOSIS — Z79899 Other long term (current) drug therapy: Secondary | ICD-10-CM

## 2015-07-09 DIAGNOSIS — I272 Other secondary pulmonary hypertension: Secondary | ICD-10-CM

## 2015-07-09 DIAGNOSIS — R0602 Shortness of breath: Secondary | ICD-10-CM

## 2015-07-09 DIAGNOSIS — N183 Chronic kidney disease, stage 3 (moderate): Secondary | ICD-10-CM

## 2015-07-09 DIAGNOSIS — Z950 Presence of cardiac pacemaker: Secondary | ICD-10-CM

## 2015-07-09 DIAGNOSIS — I350 Nonrheumatic aortic (valve) stenosis: Secondary | ICD-10-CM

## 2015-07-09 DIAGNOSIS — I5032 Chronic diastolic (congestive) heart failure: Secondary | ICD-10-CM

## 2015-07-09 DIAGNOSIS — R140 Abdominal distension (gaseous): Secondary | ICD-10-CM

## 2015-07-09 DIAGNOSIS — I2510 Atherosclerotic heart disease of native coronary artery without angina pectoris: Secondary | ICD-10-CM

## 2015-07-09 DIAGNOSIS — I10 Essential (primary) hypertension: Secondary | ICD-10-CM

## 2015-07-09 LAB — BASIC METABOLIC PANEL
BUN: 29 mg/dL — ABNORMAL HIGH (ref 7–25)
CO2: 20 mmol/L (ref 20–31)
Calcium: 9 mg/dL (ref 8.6–10.4)
Chloride: 109 mmol/L (ref 98–110)
Creat: 1.54 mg/dL — ABNORMAL HIGH (ref 0.60–0.93)
Glucose, Bld: 122 mg/dL — ABNORMAL HIGH (ref 65–99)
Potassium: 4.7 mmol/L (ref 3.5–5.3)
Sodium: 141 mmol/L (ref 135–146)

## 2015-07-09 MED ORDER — HYDROCHLOROTHIAZIDE 25 MG PO TABS: 12.5000 mg | ORAL_TABLET | Freq: Every day | ORAL | Status: DC

## 2015-07-09 NOTE — Progress Notes (Signed)
Cardiology Office Note    Date:  07/09/2015   ID:  Heather [name], DOB Jun 16, 1941, MRN [id number]  PCP:  Minerva Ends, MD  Cardiologist:  Dr. Darlin Coco >> Dr. Candee Furbish   Electrophysiologist:  Dr. Virl Axe   Chief Complaint  Patient presents with  . Leg Swelling    Abdominal distention, weight gain    History of Present Illness:  Heather [name] is a 74 y.o. female from [place] with a hx of symptomatic bradycardia s/p PPM in 2/20, diastolic HF, mod AS, OSA, CKD, Crohn's disease.  Last seen by Dr. Virl Axe in 11/16.  She was noted to have a hx of hyperkalemia in the setting of CKD while on Spironolactone.  He had her DC the Spironolactone.  She was seen in Urgent Care 07/03/15 with hematuria.  UCx with insignificant growth of Proteus mirabilis.  She apparently had LE edema and was told by the UC doctor to call us. She then walked in to the clinic this AM with c/o abdominal distention, LE edema and 3-4 lbs weight gain.  She was added on to my schedule for evaluation.  She is here with her daughter.  She requests her daughter to interpret for her.  She notes that she has had abdominal bloating and edema since she started Lasix.  She went back on Spironolactone and took her last dose today. She denies any significant change in her breathing.  She may be a little short of breath with extreme activities. She denies chest pain, syncope, orthopnea, PND.  She denies cough, wheezing, fever.       Past Medical History  Diagnosis Date  . CHF (congestive heart failure) ([place])   . Hypertension   . Murmur, cardiac   . Pulmonary hypertension ([place])   . Crohn's disease ([place])   . 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 Sinus bradycardia 01/29/2014  . High cholesterol   . OSA on CPAP   . Type II diabetes mellitus ([place])   . GERD (gastroesophageal reflux disease)   . Migraine     "1-2 times/yr" (02/16/2014)  . Chronic back pain   . Skin cancer 2003    "3 cut off  face" (02/16/2014)    Past Surgical History  Procedure Laterality Date  . Insert / replace / remove pacemaker  02/14/2014  . Vaginal hysterectomy  1972  . Cataract extraction Bilateral 1982  . Cardiac catheterization  1995; 2005  . Skin cancer excision  2003     "face X 3" (02/16/2014)  . Temporary pacemaker insertion N/A 02/12/2014    Procedure: TEMPORARY PACEMAKER INSERTION;  Surgeon: Lorretta Harp, MD;  Location: [facility] CATH LAB;  Service: Cardiovascular;  Laterality: N/A;  . Permanent pacemaker insertion N/A 02/14/2014    Procedure: PERMANENT PACEMAKER INSERTION;  Surgeon: Deboraha Sprang, MD;  Location: [facility] CATH LAB;  Service: Cardiovascular;  Laterality: N/A;    Current Outpatient Prescriptions  Medication Sig Dispense Refill  . aspirin 81 MG EC tablet Take 1 tablet (81 mg total) by mouth daily. 30 tablet 5  . carvedilol (COREG) 6.25 MG tablet Take 1 tablet (6.25 mg total) by mouth 2 (two) times daily with a meal. 60 tablet 3  . diphenhydramine-acetaminophen (TYLENOL PM) 25-500 MG TABS Take 1 tablet by mouth at bedtime as needed (for sleep).    . hydrochlorothiazide (MICROZIDE) 12.5 MG capsule Take 12.5 mg by mouth daily.    Marland Kitchen losartan (COZAAR) 50 MG tablet Take 1 tablet (50 mg total)  by mouth daily. 30 tablet 5  . NIFEdipine (PROCARDIA-XL/ADALAT CC) 30 MG 24 hr tablet Take 1 tablet (30 mg total) by mouth daily. 30 tablet 5  . pantoprazole (PROTONIX) 40 MG tablet Take 1 tablet (40 mg total) by mouth daily. 30 tablet 5  . hydrochlorothiazide (HYDRODIURIL) 25 MG tablet Take 0.5 tablets (12.5 mg total) by mouth daily. 90 tablet 3   No current facility-administered medications for this visit.    Allergies:   Review of patient's allergies indicates no known allergies.   Social History   Social History  . Marital Status: Widowed    Spouse Name: N/A  . Number of Children: N/A  . Years of Education: N/A   Social History Main Topics  . Smoking status: Never Smoker   . Smokeless  tobacco: Never Used  . Alcohol Use: No  . Drug Use: No  . Sexual Activity: No   Other Topics Concern  . None   Social History Narrative   Moved here from [place] in October 2014.     Family History:  The patient's family history includes Heart disease in her mother; Hypertension in her mother. She was adopted.   ROS:   Please see the history of present illness.    Review of Systems  Cardiovascular: Positive for leg swelling.  All other systems reviewed and are negative.   PHYSICAL EXAM:   VS:  BP 118/76 mmHg  Pulse 75  Ht 5' 2"  (1.575 m)  Wt 172 lb 3.2 oz (78.109 kg)  BMI 31.49 kg/m2   GEN: Well nourished, well developed, in no acute distress HEENT: normal Neck: no JVD, no masses Cardiac: Normal V7/C5, RRR; 2/6 systolic murmur RUSB, rubs, or gallops, no edema  Respiratory:  clear to auscultation bilaterally; no wheezing, rhonchi or rales GI: soft, nontender, nondistended, + BS MS: no deformity or atrophy Skin: warm and dry, no rash Neuro:  Bilateral strength equal, no focal deficits  Psych: Alert and oriented x 3, normal affect  Wt Readings from Last 3 Encounters:  07/09/15 172 lb 3.2 oz (78.109 kg)  06/05/15 168 lb 9.6 oz (76.476 kg)  05/31/15 164 lb 6.4 oz (74.571 kg)      Studies/Labs Reviewed:   EKG:  EKG is ordered today.  The ekg ordered today demonstrates A paced, HR 75, normal axis, NSSTTW changes, QTc 415 ms  Recent Labs: 05/15/2015: ALT 44* 06/19/2015: BUN 44*; Creat 1.76*; Potassium 4.4; Sodium 136   Recent Lipid Panel No results found for: CHOL, TRIG, HDL, CHOLHDL, VLDL, LDLCALC, LDLDIRECT  Additional studies/ records that were reviewed today include:   Echo 01/30/14 Mild LVH, EF 60-65%, normal wall motion, grade 2 diastolic dysfunction, moderate aortic stenosis (peak 41 mmHg, mean 21 mmHg, AVA 1.1-1.2 cm), mild AI, MAC, mild MR, severe LAE (57 mL/m2), mild RAE, moderate TR, PASP 51 mmHg (moderate pulmonary hypertension)    ASSESSMENT:     1. Chronic diastolic heart failure ([place])   2. Aortic stenosis   3. CKD (chronic kidney disease), stage 3 (moderate)   4. Pacemaker   5. Pulmonary hypertension ([place])   6. Essential hypertension   7. Shortness of breath     PLAN:  In order of problems listed above:  1. CHF- Her volume status actually looks pretty good.  She appears to be stable.  However her weights are up. She has minimal SOB.  No JVD on exam.  I will get a BMET, BNP.  She mainly has side effects to the Furosemide.  She previously tolerated HCTZ.  Will try her back on HCTZ 12.5 mg QD.  Repeat BMET in 1 week.  If she has increasing weight or edema, I could consider increasing HCTZ to 25 mg QD.    2. AS- Mod by Echo > 12 mos ago.  Repeat Echo.  3. CKD - Follow renal function and K+ closely with the change in diuretics.  We discussed the dangers of taking Spironolactone given her hx of hyperkalemia.  I have recommended she stop taking Spironolactone. She agrees with this plan.  4. S/p PPM - FU with EP as planned.   5. Pulmonary HTN - Largely asymptomatic.  Get Echo to recheck PASP as well.     Medication Adjustments/Labs and Tests Ordered: Current medicines are reviewed at length with the patient today.  Concerns regarding medicines are outlined above.  Medication changes, Labs and Tests ordered today are listed below. Patient Instructions  Medication Instructions:  1. STOP SPIRONOLACTONE   2. STOP LASIX   3. START HCTZ 25 TABLET WITH THE DIRECTIONS TO TAKE 1/2 TABLET DAILY TO = 12.5 MG DAILY  Labwork: 1. TODAY BMET, BNP  2. BMET IN 1 WEEK  Testing/Procedures: Your physician has requested that you have an echocardiogram. Echocardiography is a painless test that uses sound waves to create images of your heart. It provides your doctor with information about the size and shape of your heart and how well your heart's chambers and valves are working. This procedure takes approximately one hour. There are no  restrictions for this procedure.  Follow-Up: FOLLOW UP AS PLANNED NEXT YEAR WITH DR. Marlou Porch; WE WILL SEND OUT A REMINDER LETTER TO MAKE AN APPT  Any Other Special Instructions Will Be Listed Below (If Applicable).     If you need a refill on your cardiac medications before your next appointment, please call your pharmacy.        Signed, Richardson Dopp, PA-C  07/09/2015 4:28 PM    [facility] [address], [place], [place]  [address] Phone: [phone]; Fax: [phone]

## 2015-07-09 NOTE — Telephone Encounter (Signed)
PT  WALKED IN  WITH   DAUGHTER C/O  OF  ABDOMINAL  DISTENTION ONLY NO  OTHER  SYMPTOMS WAS  RECENTLY  IN  ED   AND  MEDS  WERE NOT  ADJUSTED  AND  WAS TOLD  TO  F/U WITH  CARDIOLOGY  ,    AT LAST OFFICE  VISIT  DR Caryl Comes  DISCONTINUED   ALDACTONE SINCE  THEN  EDEMA  WAS NOTED  TO ABDOMEN  WEIGHT  STABLE  ACCORDING  TO   OUR  SCALES  PER  DAUGHTER  WEIGHT  IS  UP  3-4  LBS  IS  TAKING  FUROSEMIDE 40 MG  EVERY DAY  DISCUSSED WITH SCOTT  WEAVER PAC  PER SCOTT  PT   MAY TAKE  AN EXTRA  FUROSEMIDE  TODAY  NEEDS  BMET  BNP TODAY  AND  F/U  WITH  SCOTT   SCHEDULED  FOR  TODAY  AT  3:30 PM PT AND  DAUGHTER   AGREE WITH PLAN WILL RETURN LATER  TODAY ./CY

## 2015-07-09 NOTE — Patient Instructions (Addendum)
Medication Instructions:  1. STOP SPIRONOLACTONE   2. STOP LASIX   3. START HCTZ 25 TABLET WITH THE DIRECTIONS TO TAKE 1/2 TABLET DAILY TO = 12.5 MG DAILY  Labwork: 1. TODAY BMET, BNP  2. BMET IN 1 WEEK  Testing/Procedures: Your physician has requested that you have an echocardiogram. Echocardiography is a painless test that uses sound waves to create images of your heart. It provides your doctor with information about the size and shape of your heart and how well your heart's chambers and valves are working. This procedure takes approximately one hour. There are no restrictions for this procedure.  Follow-Up: FOLLOW UP AS PLANNED NEXT YEAR WITH DR. Marlou Porch; WE WILL SEND OUT A REMINDER LETTER TO MAKE AN APPT  Any Other Special Instructions Will Be Listed Below (If Applicable).     If you need a refill on your cardiac medications before your next appointment, please call your pharmacy.

## 2015-07-10 LAB — BRAIN NATRIURETIC PEPTIDE: Brain Natriuretic Peptide: 148 pg/mL — ABNORMAL HIGH (ref 0.0–100.0)

## 2015-07-11 ENCOUNTER — Ambulatory Visit (HOSPITAL_COMMUNITY): Payer: No Typology Code available for payment source | Attending: Cardiovascular Disease

## 2015-07-11 ENCOUNTER — Other Ambulatory Visit: Payer: Self-pay

## 2015-07-11 ENCOUNTER — Telehealth: Payer: Self-pay | Admitting: *Deleted

## 2015-07-11 DIAGNOSIS — I350 Nonrheumatic aortic (valve) stenosis: Secondary | ICD-10-CM

## 2015-07-11 DIAGNOSIS — I352 Nonrheumatic aortic (valve) stenosis with insufficiency: Secondary | ICD-10-CM | POA: Insufficient documentation

## 2015-07-11 DIAGNOSIS — E119 Type 2 diabetes mellitus without complications: Secondary | ICD-10-CM | POA: Insufficient documentation

## 2015-07-11 DIAGNOSIS — I071 Rheumatic tricuspid insufficiency: Secondary | ICD-10-CM | POA: Insufficient documentation

## 2015-07-11 DIAGNOSIS — I10 Essential (primary) hypertension: Secondary | ICD-10-CM | POA: Insufficient documentation

## 2015-07-11 DIAGNOSIS — I052 Rheumatic mitral stenosis with insufficiency: Secondary | ICD-10-CM | POA: Insufficient documentation

## 2015-07-11 NOTE — Telephone Encounter (Signed)
Lmptcb to go over lab results and recommendations to increase HCTZ to 25 mg x 2 then resume 12.5 mg daily after the 2 day increase.

## 2015-07-12 ENCOUNTER — Encounter: Payer: Self-pay | Admitting: Physician Assistant

## 2015-07-12 NOTE — Telephone Encounter (Signed)
Pt and her daughter notified of lab and echo results by phone with verbal understanding to findings and recommendations.

## 2015-07-16 ENCOUNTER — Other Ambulatory Visit (INDEPENDENT_AMBULATORY_CARE_PROVIDER_SITE_OTHER): Payer: No Typology Code available for payment source | Admitting: *Deleted

## 2015-07-16 DIAGNOSIS — I5032 Chronic diastolic (congestive) heart failure: Secondary | ICD-10-CM

## 2015-07-16 DIAGNOSIS — R0602 Shortness of breath: Secondary | ICD-10-CM

## 2015-07-16 LAB — BASIC METABOLIC PANEL
BUN: 44 mg/dL — ABNORMAL HIGH (ref 7–25)
CO2: 23 mmol/L (ref 20–31)
Calcium: 9.9 mg/dL (ref 8.6–10.4)
Chloride: 103 mmol/L (ref 98–110)
Creat: 1.88 mg/dL — ABNORMAL HIGH (ref 0.60–0.93)
Glucose, Bld: 69 mg/dL (ref 65–99)
Potassium: 5 mmol/L (ref 3.5–5.3)
Sodium: 134 mmol/L — ABNORMAL LOW (ref 135–146)

## 2015-07-16 NOTE — Addendum Note (Signed)
Addended by: Eulis Foster on: 07/16/2015 07:37 AM   Modules accepted: Orders

## 2015-07-17 ENCOUNTER — Telehealth: Payer: Self-pay | Admitting: *Deleted

## 2015-07-17 DIAGNOSIS — I5031 Acute diastolic (congestive) heart failure: Secondary | ICD-10-CM

## 2015-07-17 DIAGNOSIS — N184 Chronic kidney disease, stage 4 (severe): Secondary | ICD-10-CM

## 2015-07-17 NOTE — Telephone Encounter (Signed)
Pt has given permission to s/w daughter. Daughter notified of lab results. she states pt is only taking HCTZ 12.5 mg daily, med list corrected today. BMET 07/23/15.

## 2015-07-23 ENCOUNTER — Other Ambulatory Visit (INDEPENDENT_AMBULATORY_CARE_PROVIDER_SITE_OTHER): Payer: No Typology Code available for payment source | Admitting: *Deleted

## 2015-07-23 DIAGNOSIS — N184 Chronic kidney disease, stage 4 (severe): Secondary | ICD-10-CM

## 2015-07-23 DIAGNOSIS — I5031 Acute diastolic (congestive) heart failure: Secondary | ICD-10-CM

## 2015-07-23 LAB — BASIC METABOLIC PANEL
BUN: 41 mg/dL — ABNORMAL HIGH (ref 7–25)
CO2: 25 mmol/L (ref 20–31)
Calcium: 9.3 mg/dL (ref 8.6–10.4)
Chloride: 105 mmol/L (ref 98–110)
Creat: 1.69 mg/dL — ABNORMAL HIGH (ref 0.60–0.93)
Glucose, Bld: 83 mg/dL (ref 65–99)
Potassium: 4.3 mmol/L (ref 3.5–5.3)
Sodium: 138 mmol/L (ref 135–146)

## 2015-07-23 NOTE — Addendum Note (Signed)
Addended by: Eulis Foster on: 07/23/2015 12:07 PM   Modules accepted: Orders

## 2015-07-24 ENCOUNTER — Telehealth: Payer: Self-pay | Admitting: *Deleted

## 2015-07-24 NOTE — Telephone Encounter (Signed)
Lmptcb for lab results 

## 2015-07-24 NOTE — Telephone Encounter (Signed)
pt walked in to office today and was given lab results at that time.

## 2015-08-02 ENCOUNTER — Ambulatory Visit: Payer: No Typology Code available for payment source | Attending: Family Medicine

## 2015-08-02 ENCOUNTER — Other Ambulatory Visit: Payer: Self-pay | Admitting: Family Medicine

## 2015-08-02 ENCOUNTER — Telehealth: Payer: Self-pay | Admitting: Family Medicine

## 2015-08-02 DIAGNOSIS — I5031 Acute diastolic (congestive) heart failure: Secondary | ICD-10-CM

## 2015-08-02 NOTE — Telephone Encounter (Signed)
Pt. Came in requesting a refill on furosemide (LASIX) 40 MG tablet. Please f/u

## 2015-08-05 MED ORDER — FUROSEMIDE 40 MG PO TABS: 40.0000 mg | ORAL_TABLET | Freq: Every day | ORAL | Status: DC

## 2015-08-05 NOTE — Telephone Encounter (Signed)
Lasix refilled OV needed to check weight, fluid status and BMP

## 2015-08-06 NOTE — Telephone Encounter (Signed)
Pt notified Rx at pharmacy  Need OV for wt check  Information given in Spanish

## 2015-09-04 ENCOUNTER — Telehealth: Payer: Self-pay | Admitting: Cardiology

## 2015-09-04 ENCOUNTER — Ambulatory Visit (INDEPENDENT_AMBULATORY_CARE_PROVIDER_SITE_OTHER): Payer: No Typology Code available for payment source | Admitting: *Deleted

## 2015-09-04 DIAGNOSIS — I495 Sick sinus syndrome: Secondary | ICD-10-CM

## 2015-09-04 NOTE — Progress Notes (Signed)
Remote pacemaker transmission.   

## 2015-09-04 NOTE — Telephone Encounter (Signed)
Confirmed remote transmission w/ pt daughter.

## 2015-09-06 ENCOUNTER — Other Ambulatory Visit: Payer: Self-pay | Admitting: Family Medicine

## 2015-09-09 LAB — CUP PACEART REMOTE DEVICE CHECK
Battery Remaining Longevity: 102 mo
Battery Voltage: 3.02 V
Brady Statistic AP VP Percent: 0.04 %
Brady Statistic AP VS Percent: 97.87 %
Brady Statistic AS VP Percent: 0 %
Brady Statistic AS VS Percent: 2.09 %
Brady Statistic RA Percent Paced: 97.91 %
Brady Statistic RV Percent Paced: 0.04 %
Date Time Interrogation Session: 20170215170148
Implantable Lead Implant Date: 20150729
Implantable Lead Implant Date: 20150729
Implantable Lead Location: 753859
Implantable Lead Location: 753860
Implantable Lead Model: 5076
Implantable Lead Model: 5076
Lead Channel Impedance Value: 380 Ohm
Lead Channel Impedance Value: 513 Ohm
Lead Channel Impedance Value: 513 Ohm
Lead Channel Impedance Value: 570 Ohm
Lead Channel Pacing Threshold Amplitude: 0.75 V
Lead Channel Pacing Threshold Amplitude: 0.875 V
Lead Channel Pacing Threshold Pulse Width: 0.4 ms
Lead Channel Pacing Threshold Pulse Width: 0.4 ms
Lead Channel Sensing Intrinsic Amplitude: 10.75 mV
Lead Channel Sensing Intrinsic Amplitude: 10.75 mV
Lead Channel Sensing Intrinsic Amplitude: 2.25 mV
Lead Channel Sensing Intrinsic Amplitude: 2.25 mV
Lead Channel Setting Pacing Amplitude: 1.75 V
Lead Channel Setting Pacing Amplitude: 2 V
Lead Channel Setting Pacing Pulse Width: 0.4 ms
Lead Channel Setting Sensing Sensitivity: 2 mV

## 2015-09-13 ENCOUNTER — Encounter: Payer: Self-pay | Admitting: Cardiology

## 2015-09-16 ENCOUNTER — Ambulatory Visit: Payer: No Typology Code available for payment source | Attending: Family Medicine | Admitting: Family Medicine

## 2015-09-16 ENCOUNTER — Encounter: Payer: Self-pay | Admitting: Family Medicine

## 2015-09-16 VITALS — BP 106/66 | HR 74 | Temp 98.1°F | Resp 16 | Wt 168.0 lb

## 2015-09-16 DIAGNOSIS — R3129 Other microscopic hematuria: Secondary | ICD-10-CM

## 2015-09-16 DIAGNOSIS — I10 Essential (primary) hypertension: Secondary | ICD-10-CM

## 2015-09-16 DIAGNOSIS — E1122 Type 2 diabetes mellitus with diabetic chronic kidney disease: Secondary | ICD-10-CM

## 2015-09-16 DIAGNOSIS — Z0000 Encounter for general adult medical examination without abnormal findings: Secondary | ICD-10-CM

## 2015-09-16 DIAGNOSIS — K625 Hemorrhage of anus and rectum: Secondary | ICD-10-CM

## 2015-09-16 DIAGNOSIS — H04123 Dry eye syndrome of bilateral lacrimal glands: Secondary | ICD-10-CM

## 2015-09-16 DIAGNOSIS — N184 Chronic kidney disease, stage 4 (severe): Secondary | ICD-10-CM

## 2015-09-16 DIAGNOSIS — I129 Hypertensive chronic kidney disease with stage 1 through stage 4 chronic kidney disease, or unspecified chronic kidney disease: Secondary | ICD-10-CM | POA: Insufficient documentation

## 2015-09-16 DIAGNOSIS — J3489 Other specified disorders of nose and nasal sinuses: Secondary | ICD-10-CM

## 2015-09-16 DIAGNOSIS — N952 Postmenopausal atrophic vaginitis: Secondary | ICD-10-CM

## 2015-09-16 DIAGNOSIS — Z7982 Long term (current) use of aspirin: Secondary | ICD-10-CM | POA: Insufficient documentation

## 2015-09-16 DIAGNOSIS — E119 Type 2 diabetes mellitus without complications: Secondary | ICD-10-CM | POA: Insufficient documentation

## 2015-09-16 DIAGNOSIS — N939 Abnormal uterine and vaginal bleeding, unspecified: Secondary | ICD-10-CM

## 2015-09-16 DIAGNOSIS — Z79899 Other long term (current) drug therapy: Secondary | ICD-10-CM | POA: Insufficient documentation

## 2015-09-16 LAB — COMPLETE METABOLIC PANEL WITH GFR
ALT: 24 U/L (ref 6–29)
AST: 25 U/L (ref 10–35)
Albumin: 4.1 g/dL (ref 3.6–5.1)
Alkaline Phosphatase: 152 U/L — ABNORMAL HIGH (ref 33–130)
BUN: 30 mg/dL — ABNORMAL HIGH (ref 7–25)
CO2: 25 mmol/L (ref 20–31)
Calcium: 10 mg/dL (ref 8.6–10.4)
Chloride: 103 mmol/L (ref 98–110)
Creat: 1.48 mg/dL — ABNORMAL HIGH (ref 0.60–0.93)
GFR, Est African American: 40 mL/min — ABNORMAL LOW (ref >=60)
GFR, Est Non African American: 34 mL/min — ABNORMAL LOW (ref >=60)
Glucose, Bld: 76 mg/dL (ref 65–99)
Potassium: 4 mmol/L (ref 3.5–5.3)
Sodium: 139 mmol/L (ref 135–146)
Total Bilirubin: 0.6 mg/dL (ref 0.2–1.2)
Total Protein: 7.8 g/dL (ref 6.1–8.1)

## 2015-09-16 LAB — POCT URINALYSIS DIPSTICK
Bilirubin, UA: NEGATIVE
Glucose, UA: NEGATIVE
Ketones, UA: NEGATIVE
Nitrite, UA: NEGATIVE
Protein, UA: NEGATIVE
Spec Grav, UA: 1.015
Urobilinogen, UA: 0.2
pH, UA: 5.5

## 2015-09-16 LAB — CBC
HCT: 42.6 % (ref 36.0–46.0)
Hemoglobin: 14 g/dL (ref 12.0–15.0)
MCH: 29.9 pg (ref 26.0–34.0)
MCHC: 32.9 g/dL (ref 30.0–36.0)
MCV: 90.8 fL (ref 78.0–100.0)
MPV: 10.8 fL (ref 8.6–12.4)
Platelets: 282 10*3/uL (ref 150–400)
RBC: 4.69 MIL/uL (ref 3.87–5.11)
RDW: 13.3 % (ref 11.5–15.5)
WBC: 9.1 10*3/uL (ref 4.0–10.5)

## 2015-09-16 LAB — GLUCOSE, POCT (MANUAL RESULT ENTRY): POC Glucose: 69 mg/dl — AB (ref 70–99)

## 2015-09-16 LAB — POCT GLYCOSYLATED HEMOGLOBIN (HGB A1C): Hemoglobin A1C: 5.6

## 2015-09-16 MED ORDER — SALINE SPRAY 0.65 % NA SOLN: 1.0000 | NASAL | Status: DC | PRN

## 2015-09-16 MED ORDER — POLYETHYL GLYCOL-PROPYL GLYCOL 0.4-0.3 % OP SOLN: 2.0000 [drp] | OPHTHALMIC | Status: DC | PRN

## 2015-09-16 MED ORDER — ESTRADIOL 0.1 MG/GM VA CREA: TOPICAL_CREAM | VAGINAL | Status: DC

## 2015-09-16 NOTE — Assessment & Plan Note (Signed)
A: atrophic vaginitis P: Vaginal estrogen

## 2015-09-16 NOTE — Progress Notes (Signed)
F/U DM  Glucose running 80-106 Taking medication as prescribed  No tobacco user  No suicidal thoughts in the pas two weeks

## 2015-09-16 NOTE — Progress Notes (Signed)
Subjective:  Patient ID: Heather [name], female    DOB: 1941/05/01  Age: 75 y.o. MRN: [id number]  CC: Diabetes  Spanish interpreter used   HPI Heather [name] presents for    1 CHRONIC DIABETES  Disease Monitoring  Blood Sugar Ranges: 80-106  Polyuria: yes, at night, she urinates 3-4 times per night    Visual problems: no   Medication Compliance: no medications   Medication Side Effects  Hypoglycemia: no   Preventitive Health Care  Eye Exam: due   Foot Exam: done today    2. Vaginal bleeding: she notes painless vaginal bleeding that is bright red and scant. Usually occurs with activity. Not sexually active. No blood in stool. Bleeding ongoing for past few months. No dizziness, lightheadedness or palpitations.   Social History  Substance Use Topics  . Smoking status: Never Smoker   . Smokeless tobacco: Never Used  . Alcohol Use: No    Outpatient Prescriptions Prior to Visit  Medication Sig Dispense Refill  . aspirin 81 MG EC tablet Take 1 tablet (81 mg total) by mouth daily. 30 tablet 5  . carvedilol (COREG) 6.25 MG tablet TAKE 1 TABLET BY MOUTH 2 TIMES DAILY WITH A MEAL. 60 tablet 2  . diphenhydramine-acetaminophen (TYLENOL PM) 25-500 MG TABS Take 1 tablet by mouth at bedtime as needed (for sleep).    . furosemide (LASIX) 40 MG tablet Take 1 tablet (40 mg total) by mouth daily. 30 tablet 0  . hydrochlorothiazide (HYDRODIURIL) 25 MG tablet Take 0.5 tablets (12.5 mg total) by mouth daily. 90 tablet 3  . losartan (COZAAR) 50 MG tablet Take 1 tablet (50 mg total) by mouth daily. 30 tablet 5  . NIFEdipine (PROCARDIA-XL/ADALAT CC) 30 MG 24 hr tablet Take 1 tablet (30 mg total) by mouth daily. 30 tablet 5  . pantoprazole (PROTONIX) 40 MG tablet Take 1 tablet (40 mg total) by mouth daily. 30 tablet 5   No facility-administered medications prior to visit.    ROS Review of Systems  Constitutional: Negative for fever and chills.  HENT:       Dry nose and mouth   Eyes:  Negative for visual disturbance.       Dry eyes   Respiratory: Positive for cough. Negative for shortness of breath.   Cardiovascular: Positive for chest pain (intermittent L sided lateral CP ). Negative for palpitations and leg swelling.  Gastrointestinal: Negative for nausea, vomiting, abdominal pain, diarrhea, constipation, blood in stool and anal bleeding.  Genitourinary: Positive for frequency, vaginal bleeding and pelvic pain. Negative for dysuria.  Musculoskeletal: Positive for myalgias and arthralgias. Negative for back pain.  Skin: Negative for rash.  Allergic/Immunologic: Negative for immunocompromised state.  Neurological: Positive for headaches.  Hematological: Negative for adenopathy. Does not bruise/bleed easily.  Psychiatric/Behavioral: Negative for suicidal ideas and dysphoric mood.   Objective:  BP 106/66 mmHg  Pulse 74  Temp(Src) 98.1 F (36.7 C) (Oral)  Resp 16  Wt 168 lb (76.204 kg)  SpO2 97%  BP/Weight 09/16/2015 07/09/2015 56/31/4970  Systolic BP 263 785 885  Diastolic BP 66 76 68  Wt. (Lbs) 168 172.2 -  BMI 30.72 31.49 -    Physical Exam  Constitutional: She is oriented to person, place, and time. She appears well-developed and well-nourished. No distress.  HENT:  Head: Normocephalic and atraumatic.  Cardiovascular: Normal rate, regular rhythm, normal heart sounds and intact distal pulses.   Pulmonary/Chest: Effort normal and breath sounds normal.  Genitourinary: Rectal exam shows no external hemorrhoid,  no internal hemorrhoid, no fissure, no mass, no tenderness and anal tone normal. Guaiac negative stool. There is bleeding in the vagina.  Bright red, scant vaginal bleeding noted at posterior fornix.   Cervix and uterus are surgically absent     Musculoskeletal: She exhibits no edema.       Legs: Neurological: She is alert and oriented to person, place, and time.  Skin: Skin is warm and dry. No rash noted.  Stuck on hyperpigmented moles on L upper  back and L arm   Psychiatric: She has a normal mood and affect.    Lab Results  Component Value Date   HGBA1C 5.80 05/06/2015   Lab Results  Component Value Date   HGBA1C 5.6 09/16/2015   FOBT negative  UA clear, moderate blood, large RBC   CBG 69 Assessment & Plan:   Fredia was seen today for diabetes.  Diagnoses and all orders for this visit:  Controlled type 2 diabetes mellitus with chronic kidney disease, without long-term current use of insulin, unspecified CKD stage (HCC) -     HgB A1c -     Glucose (CBG) -     Ambulatory referral to Ophthalmology -     COMPLETE METABOLIC PANEL WITH GFR  Essential hypertension  CKD (chronic kidney disease) stage 4, GFR 15-29 ml/min ([place])  Healthcare maintenance -     Ambulatory referral to Gastroenterology  Vaginal bleeding -     CBC -     POCT urinalysis dipstick  Dry eyes, bilateral -     Polyethyl Glycol-Propyl Glycol (SYSTANE) 0.4-0.3 % SOLN; Apply 2 drops to eye as needed.  Dry nose -     sodium chloride (OCEAN) 0.65 % SOLN nasal spray; Place 1 spray into both nostrils as needed (dry nose).  Rectal bleeding -     Hemoccult - 1 Card (office)  Postmenopausal atrophic vaginitis -     estradiol (ESTRACE) 0.1 MG/GM vaginal cream; 2 grams daily for one week, then 1 gram daily for one week, then 1 gm 1-3 times weekly to prevent bleeding  Microscopic hematuria -     Urine culture     No orders of the defined types were placed in this encounter.    Follow-up: No Follow-up on file.   Boykin Nearing MD

## 2015-09-16 NOTE — Assessment & Plan Note (Signed)
A: diet controlled diabetes P: No meds Opthalmology referral

## 2015-09-16 NOTE — Patient Instructions (Addendum)
Heather [name] was seen today for diabetes.  Diagnoses and all orders for this visit:  Controlled type 2 diabetes mellitus with chronic kidney disease, without long-term current use of insulin, unspecified CKD stage (HCC) -     HgB A1c -     Glucose (CBG) -     Ambulatory referral to Ophthalmology -     COMPLETE METABOLIC PANEL WITH GFR  Essential hypertension  CKD (chronic kidney disease) stage 4, GFR 15-29 ml/min ([place])  Healthcare maintenance -     Ambulatory referral to Gastroenterology  Vaginal bleeding -     CBC -     POCT urinalysis dipstick  Dry eyes, bilateral -     Polyethyl Glycol-Propyl Glycol (SYSTANE) 0.4-0.3 % SOLN; Apply 2 drops to eye as needed.  Dry nose -     sodium chloride (OCEAN) 0.65 % SOLN nasal spray; Place 1 spray into both nostrils as needed (dry nose).  Rectal bleeding -     Hemoccult - 1 Card (office)  Postmenopausal atrophic vaginitis -     estradiol (ESTRACE) 0.1 MG/GM vaginal cream; 2 grams daily for one week, then 1 gram daily for one week, then 1 gm 1-3 times weekly to prevent bleeding  Microscopic hematuria -     Urine culture   F/u in 3 months for atrophic vaginitis   Dr. Adrian Blackwater   Vaginitis atrfica (Atrophic Vaginitis) La vaginitis atrfica es una afeccin en la que los tejidos que recubren la vagina se secan y [place]. Esta afeccin es ms frecuente en las mujeres que ya no tienen perodos menstruales regulares (menopausia). Esto suele comenzar cuando la mujer tiene entre 43 y 76aos. El estrgeno ayuda a Theatre manager la humedad de la vagina, ya que la estimula para producir un lquido transparente que lubrica la vagina [name]. Este lquido tambin protege a la vagina contra las infecciones. La falta de estrgeno puede hacer que el recubrimiento de la vagina se afine y se seque. El tamao de la vagina tambin puede disminuir y perder elasticidad. La vaginitis atrfica tiende a Engineering geologist a medida que el nivel de  estrgeno desciende. CAUSAS La causa de esta afeccin es el descenso normal del nivel de estrgeno que se produce en torno a la menopausia. FACTORES DE RIESGO Determinadas afecciones o situaciones pueden reducir [employer] de estrgeno, lo que aumenta el riesgo de que una mujer presente vaginitis atrfica. Estos incluyen los siguientes:  Tomar un medicamento que detenga la produccin de [place].  Someterse a [place] de extraccin de ovarios.  Recibir tratamiento contra el cncer mediante rayos X (radiacin) o medicamentos (quimioterapia).  Hacer ejercicios muy intensos y con Building services engineer.  Tener un trastorno alimenticio (anorexia).  Dar a Customer service manager.  Tener ms de 50 aos.  Fumar. SNTOMAS Los sntomas de esta afeccin incluyen lo siguiente:  Social research officer, government, irritacin o sangrado durante las relaciones sexuales (dispareunia).  Ardor, irritacin o picazn vaginal.  Dolor o sangrado durante un examen vaginal con espculo (examen plvico).  Falta de inters en las [employer].  Ardor al [place].  Flujo vaginal marrn o [place]. En algunos casos no hay sntomas. DIAGNSTICO Esta afeccin se diagnostica mediante la historia clnica y un examen fsico. Este incluye un examen plvico que verifica si los tejidos de la parte interna de la vagina se ven plidos, delgados o secos. En contadas ocasiones, tambin [employer], [place] los siguientes:  [place]lisis de [place].  Un estudio que verifica el equilibrio de cido  en el lquido vaginal (estudio de equilibrio de cido). TRATAMIENTO El tratamiento de esta afeccin puede depender de la gravedad de los sntomas. El tratamiento puede incluir lo siguiente:  Uso de un lubricante vaginal de venta libre antes de las relaciones sexuales.  Uso de una crema humectante vaginal de accin prolongada.  Uso de estrgeno vaginal de dosis bajas para los sntomas de moderados a graves que no responden a otros  tratamientos. Las opciones incluyen cremas, tabletas y [place]. Antes de usar estrgeno vaginal, dgale al mdico si tiene antecedentes de:  Cncer de mama.  Cncer de endometrio.  Cogulos sanguneos.  Tomar medicamentos. Probablemente pueda tomar un comprimido diario para la dispareunia. Analice todos los riesgos de este medicamento con el mdico. Por lo general, no se recomienda en mujeres con antecedentes personales o familiares de cncer de mama. Si sus sntomas son muy leves y usted no [employer], es posible que no necesite tratamiento. [place] los medicamentos solamente como se lo haya indicado el mdico. No utilice medicamentos alternativos o a base de hierbas a menos que el mdico se lo autorice.  Utilice cremas, lubricantes o cremas humectantes de venta libre para la sequedad vaginal solamente como se lo haya indicado el mdico.  Si la causa de la vaginitis atrfica es la menopausia, analice todos sus sntomas y opciones de tratamiento con el mdico.  No se haga duchas vaginales.  No use productos que puedan causarle sequedad vaginal. Estos incluyen los siguientes:  Aerosoles femeninos perfumados.  Tampones perfumados.  Jabones perfumados.  Hable con su pareja sexual si el coito le resulta doloroso. SOLICITE ATENCIN MDICA SI:  El flujo vaginal tiene un aspecto diferente del normal.  Detecta que tiene un olor inusual en la vagina.  Aparecen nuevos sntomas.  Los sntomas no mejoran con Dispensing optician.  Los sntomas empeoran.   Esta informacin no tiene Marine scientist el consejo del mdico. Asegrese de hacerle al mdico cualquier pregunta que tenga.   Document Released: 11/20/2014 Elsevier Interactive Patient Education [employer].

## 2015-09-18 ENCOUNTER — Telehealth: Payer: Self-pay | Admitting: *Deleted

## 2015-09-18 LAB — URINE CULTURE
Colony Count: NO GROWTH
Organism ID, Bacteria: NO GROWTH

## 2015-09-18 NOTE — Telephone Encounter (Signed)
LVM to return call.

## 2015-09-18 NOTE — Telephone Encounter (Signed)
-----   Message from Boykin Nearing, MD sent at 09/17/2015  8:43 AM EST ----- Normal CBC Stable CMP

## 2015-09-18 NOTE — Telephone Encounter (Signed)
-----   Message from Boykin Nearing, MD sent at 09/18/2015  9:03 AM EST ----- Negative urine culture

## 2015-09-18 NOTE — Telephone Encounter (Signed)
Patient's family member returned call to nurse.Marland KitchenMarland KitchenMarland KitchenMarland Kitchenplease follow up

## 2015-09-19 NOTE — Telephone Encounter (Signed)
Date of birth verified by pt Normal lab results given  Pt verbalized understanding

## 2015-11-04 ENCOUNTER — Other Ambulatory Visit: Payer: Self-pay | Admitting: Family Medicine

## 2015-11-21 ENCOUNTER — Ambulatory Visit: Payer: BLUE CROSS/BLUE SHIELD | Attending: Family Medicine | Admitting: Family Medicine

## 2015-11-21 ENCOUNTER — Encounter: Payer: Self-pay | Admitting: Family Medicine

## 2015-11-21 VITALS — BP 111/78 | HR 74 | Temp 98.3°F | Resp 16 | Ht 62.0 in | Wt 172.0 lb

## 2015-11-21 DIAGNOSIS — M25551 Pain in right hip: Secondary | ICD-10-CM | POA: Diagnosis not present

## 2015-11-21 DIAGNOSIS — N184 Chronic kidney disease, stage 4 (severe): Secondary | ICD-10-CM | POA: Insufficient documentation

## 2015-11-21 DIAGNOSIS — L659 Nonscarring hair loss, unspecified: Secondary | ICD-10-CM

## 2015-11-21 DIAGNOSIS — Z7982 Long term (current) use of aspirin: Secondary | ICD-10-CM | POA: Insufficient documentation

## 2015-11-21 DIAGNOSIS — Z79899 Other long term (current) drug therapy: Secondary | ICD-10-CM | POA: Diagnosis not present

## 2015-11-21 DIAGNOSIS — N952 Postmenopausal atrophic vaginitis: Secondary | ICD-10-CM

## 2015-11-21 DIAGNOSIS — R7303 Prediabetes: Secondary | ICD-10-CM | POA: Diagnosis not present

## 2015-11-21 LAB — BASIC METABOLIC PANEL WITH GFR
BUN: 29 mg/dL — ABNORMAL HIGH (ref 7–25)
CO2: 28 mmol/L (ref 20–31)
Calcium: 9.3 mg/dL (ref 8.6–10.4)
Chloride: 100 mmol/L (ref 98–110)
Creat: 1.64 mg/dL — ABNORMAL HIGH (ref 0.60–0.93)
GFR, Est African American: 35 mL/min — ABNORMAL LOW (ref >=60)
GFR, Est Non African American: 30 mL/min — ABNORMAL LOW (ref >=60)
Glucose, Bld: 142 mg/dL — ABNORMAL HIGH (ref 65–99)
Potassium: 4.1 mmol/L (ref 3.5–5.3)
Sodium: 137 mmol/L (ref 135–146)

## 2015-11-21 LAB — POCT GLYCOSYLATED HEMOGLOBIN (HGB A1C): Hemoglobin A1C: 5.7

## 2015-11-21 LAB — GLUCOSE, POCT (MANUAL RESULT ENTRY): POC Glucose: 198 mg/dl — AB (ref 70–99)

## 2015-11-21 MED ORDER — BIOTIN 1000 MCG PO TABS: 1000.0000 ug | ORAL_TABLET | Freq: Three times a day (TID) | ORAL | Status: DC

## 2015-11-21 MED ORDER — BIOTIN 1000 MCG PO TABS: 1000.0000 ug | ORAL_TABLET | Freq: Every day | ORAL | Status: DC

## 2015-11-21 MED ORDER — ESTRADIOL 0.1 MG/GM VA CREA: TOPICAL_CREAM | VAGINAL | Status: DC

## 2015-11-21 MED ORDER — ACETAMINOPHEN ER 650 MG PO TBCR: 650.0000 mg | EXTENDED_RELEASE_TABLET | Freq: Three times a day (TID) | ORAL | Status: DC | PRN

## 2015-11-21 NOTE — Patient Instructions (Addendum)
Heather [name] was seen today for diabetes.  Diagnoses and all orders for this visit:  Postmenopausal atrophic vaginitis -     estradiol (ESTRACE) 0.1 MG/GM vaginal cream; 2 grams daily  CKD (chronic kidney disease) stage 4, GFR 15-29 ml/min (HCC) -     BASIC METABOLIC PANEL WITH GFR  Prediabetes -     POCT glycosylated hemoglobin (Hb A1C) -     POCT glucose (manual entry)  Right hip pain -     acetaminophen (TYLENOL 8 HOUR) 650 MG CR tablet; Take 1 tablet (650 mg total) by mouth every 8 (eight) hours as needed for pain. -     Ambulatory referral to Physical Therapy -     DG HIP UNILAT WITH PELVIS 2-3 VIEWS RIGHT; Future  Loss of hair -     Discontinue: Biotin 1000 MCG tablet; Take 1 tablet (1 mg total) by mouth 3 (three) times daily. -     Biotin 1000 MCG tablet; Take 1 tablet (1 mg total) by mouth daily.   F/u in 3 months for CKD and back pain  Dr. Adrian Blackwater

## 2015-11-21 NOTE — Assessment & Plan Note (Signed)
R hip pain is chronic. Fall this AM. No evidence of fracture.   Plan  Tylenol for pain control PT referral

## 2015-11-21 NOTE — Progress Notes (Signed)
F/U DM  No checking glucose at home  Taking medication as prescribed  Pain scale # 9- due to injury  No suicidal thought in the past two weeks Pt requesting PT referral

## 2015-11-21 NOTE — Assessment & Plan Note (Signed)
Prediabetes Normal A1c  Healthy diet and exercise

## 2015-11-21 NOTE — Progress Notes (Signed)
Subjective:  Patient ID: Heather [name], female    DOB: 1941-01-21  Age: 75 y.o. MRN: [id number] Spanish interpreter used  CC: Diabetes   HPI [facility] presents for   1. CHRONIC DIABETES:  dx in [place]. She was previously on metformin, but believes she was misdiagnosed. She has low sugars. She has has normal A1c since 01/2014. With highest peak A1c 6.4. She feels that her sugar get low when she fast 4-5 hrs without eating. She gets pain in stomach and dizziness.   Disease Monitoring  Blood Sugar Ranges: not checking   Polyuria: no   Visual problems: no   Medication Compliance: no meds   Medication Side Effects  Hypoglycemia: subjective low    2. CKD: no swelling in legs. Compliant with all antihypertensives.   3. R hip and back pain: she has a fall this AM in the bathroom.  No head trauma or LOC. She has chronic R hip pain. She is not taking any medicine. She is able to walk unassisted. She request PT referral.   Social History  Substance Use Topics  . Smoking status: Never Smoker   . Smokeless tobacco: Never Used  . Alcohol Use: No    Outpatient Prescriptions Prior to Visit  Medication Sig Dispense Refill  . aspirin 81 MG EC tablet Take 1 tablet (81 mg total) by mouth daily. 30 tablet 5  . carvedilol (COREG) 6.25 MG tablet TAKE 1 TABLET BY MOUTH 2 TIMES DAILY WITH A MEAL. 60 tablet 2  . diphenhydramine-acetaminophen (TYLENOL PM) 25-500 MG TABS Take 1 tablet by mouth at bedtime as needed (for sleep).    Marland Kitchen estradiol (ESTRACE) 0.1 MG/GM vaginal cream 2 grams daily for one week, then 1 gram daily for one week, then 1 gm 1-3 times weekly to prevent bleeding 42.5 g 0  . fluticasone (VERAMYST) 27.5 MCG/SPRAY nasal spray Place 2 sprays into the nose daily.    . furosemide (LASIX) 40 MG tablet Take 1 tablet (40 mg total) by mouth daily. 30 tablet 0  . hydrochlorothiazide (MICROZIDE) 12.5 MG capsule TAKE 1 CAPSULE BY MOUTH DAILY. 30 capsule 2  . losartan (COZAAR) 50 MG tablet  TAKE 1 TABLET BY MOUTH DAILY. 30 tablet 2  . NIFEdipine (PROCARDIA-XL/ADALAT-CC/NIFEDICAL-XL) 30 MG 24 hr tablet TAKE 1 TABLET BY MOUTH DAILY. 30 tablet 2  . pantoprazole (PROTONIX) 40 MG tablet TAKE 1 TABLET BY MOUTH DAILY. 30 tablet 2  . Polyethyl Glycol-Propyl Glycol (SYSTANE) 0.4-0.3 % SOLN Apply 2 drops to eye as needed. 10 mL 0  . sodium chloride (OCEAN) 0.65 % SOLN nasal spray Place 1 spray into both nostrils as needed (dry nose). 30 mL 0   No facility-administered medications prior to visit.    ROS Review of Systems  Constitutional: Negative for fever and chills.  HENT:       Dry nose and mouth   Eyes: Negative for visual disturbance.       Dry eyes   Respiratory: Negative for cough and shortness of breath.   Cardiovascular: Negative for palpitations and leg swelling.  Gastrointestinal: Negative for nausea, vomiting, abdominal pain, diarrhea, constipation, blood in stool and anal bleeding.  Genitourinary: Positive for frequency and vaginal bleeding. Negative for dysuria and pelvic pain.  Musculoskeletal: Positive for myalgias, back pain and arthralgias.  Skin: Negative for rash.       Losing hair   Allergic/Immunologic: Negative for immunocompromised state.  Neurological: Negative for headaches.  Hematological: Negative for adenopathy. Does not bruise/bleed easily.  Psychiatric/Behavioral: Negative for suicidal ideas and dysphoric mood.  All other systems reviewed and are negative.   Objective:  BP 111/78 mmHg  Pulse 74  Temp(Src) 98.3 F (36.8 C) (Oral)  Resp 16  Ht 5' 2"  (1.575 m)  Wt 172 lb (78.019 kg)  BMI 31.45 kg/m2  SpO2 96%  BP/Weight 11/21/2015 09/16/2015 56/81/2751  Systolic BP 700 174 944  Diastolic BP 78 66 76  Wt. (Lbs) 172 168 172.2  BMI 31.45 30.72 31.49   Physical Exam  Constitutional: She is oriented to person, place, and time. She appears well-developed and well-nourished. No distress.  HENT:  Head: Normocephalic and atraumatic.    Cardiovascular: Normal rate, regular rhythm, normal heart sounds and intact distal pulses.   Pulmonary/Chest: Effort normal and breath sounds normal.  Musculoskeletal: She exhibits no edema.       Legs: Neurological: She is alert and oriented to person, place, and time.  Skin: Skin is warm and dry. No rash noted.  Stuck on hyperpigmented moles on L upper back and L arm   Psychiatric: She has a normal mood and affect.   Lab Results  Component Value Date   HGBA1C 5.6 09/16/2015   Lab Results  Component Value Date   HGBA1C 5.7 11/21/2015    CBG 198   Assessment & Plan:   Osiris was seen today for diabetes.  Diagnoses and all orders for this visit:  Postmenopausal atrophic vaginitis -     estradiol (ESTRACE) 0.1 MG/GM vaginal cream; 2 grams daily  CKD (chronic kidney disease) stage 4, GFR 15-29 ml/min (HCC) -     BASIC METABOLIC PANEL WITH GFR  Prediabetes -     POCT glycosylated hemoglobin (Hb A1C) -     POCT glucose (manual entry)  Right hip pain -     acetaminophen (TYLENOL 8 HOUR) 650 MG CR tablet; Take 1 tablet (650 mg total) by mouth every 8 (eight) hours as needed for pain. -     Ambulatory referral to Physical Therapy  Loss of hair -     Discontinue: Biotin 1000 MCG tablet; Take 1 tablet (1 mg total) by mouth 3 (three) times daily. -     Biotin 1000 MCG tablet; Take 1 tablet (1 mg total) by mouth daily.    No orders of the defined types were placed in this encounter.    Follow-up: No Follow-up on file.   Boykin Nearing MD

## 2015-11-21 NOTE — Assessment & Plan Note (Signed)
CKD with improved GFR on last check to stage 3 range  BMP with GFR today

## 2015-11-27 ENCOUNTER — Telehealth: Payer: Self-pay | Admitting: *Deleted

## 2015-11-27 NOTE — Telephone Encounter (Signed)
LVM to return call.

## 2015-11-27 NOTE — Telephone Encounter (Signed)
-----   Message from Boykin Nearing, MD sent at 11/25/2015  8:50 AM EDT ----- Stable labs Stable CKD

## 2015-12-04 ENCOUNTER — Telehealth: Payer: Self-pay | Admitting: Cardiology

## 2015-12-04 ENCOUNTER — Ambulatory Visit (HOSPITAL_COMMUNITY)
Admission: RE | Admit: 2015-12-04 | Discharge: 2015-12-04 | Disposition: A | Discharge status: Home / Self Care | Payer: BLUE CROSS/BLUE SHIELD | Source: Ambulatory Visit | Attending: Family Medicine | Admitting: Family Medicine

## 2015-12-04 ENCOUNTER — Ambulatory Visit (INDEPENDENT_AMBULATORY_CARE_PROVIDER_SITE_OTHER): Payer: No Typology Code available for payment source | Admitting: *Deleted

## 2015-12-04 DIAGNOSIS — M25551 Pain in right hip: Secondary | ICD-10-CM | POA: Diagnosis present

## 2015-12-04 DIAGNOSIS — I495 Sick sinus syndrome: Secondary | ICD-10-CM

## 2015-12-04 NOTE — Progress Notes (Signed)
Remote pacemaker transmission.   

## 2015-12-04 NOTE — Telephone Encounter (Signed)
LMOVM reminding pt to send remote transmission.   

## 2015-12-05 ENCOUNTER — Other Ambulatory Visit: Payer: Self-pay | Admitting: Pharmacist

## 2015-12-05 MED ORDER — ESTROGENS, CONJUGATED 0.625 MG/GM VA CREA: 1.0000 | TOPICAL_CREAM | Freq: Every day | VAGINAL | Status: DC

## 2015-12-09 NOTE — Telephone Encounter (Signed)
-----   Message from Boykin Nearing, MD sent at 12/05/2015  8:47 AM EDT ----- Mild bilateral hip arthritis

## 2015-12-09 NOTE — Telephone Encounter (Signed)
LVM to return call.

## 2015-12-10 NOTE — Telephone Encounter (Signed)
Date of birth verified by pt  Xray results and lab results given  Pt verbalized understanding  Information given in Spanish

## 2015-12-25 ENCOUNTER — Encounter: Payer: Self-pay | Admitting: Cardiology

## 2015-12-26 LAB — CUP PACEART REMOTE DEVICE CHECK
Battery Remaining Longevity: 97 mo
Battery Voltage: 3.02 V
Brady Statistic AP VP Percent: 0.25 %
Brady Statistic AP VS Percent: 96.01 %
Brady Statistic AS VP Percent: 0 %
Brady Statistic AS VS Percent: 3.73 %
Brady Statistic RA Percent Paced: 96.26 %
Brady Statistic RV Percent Paced: 0.26 %
Date Time Interrogation Session: 20170517160538
Implantable Lead Implant Date: 20150729
Implantable Lead Implant Date: 20150729
Implantable Lead Location: 753859
Implantable Lead Location: 753860
Implantable Lead Model: 5076
Implantable Lead Model: 5076
Lead Channel Impedance Value: 361 Ohm
Lead Channel Impedance Value: 513 Ohm
Lead Channel Impedance Value: 513 Ohm
Lead Channel Impedance Value: 551 Ohm
Lead Channel Pacing Threshold Amplitude: 0.75 V
Lead Channel Pacing Threshold Amplitude: 1 V
Lead Channel Pacing Threshold Pulse Width: 0.4 ms
Lead Channel Pacing Threshold Pulse Width: 0.4 ms
Lead Channel Sensing Intrinsic Amplitude: 10.375 mV
Lead Channel Sensing Intrinsic Amplitude: 10.375 mV
Lead Channel Sensing Intrinsic Amplitude: 2.125 mV
Lead Channel Sensing Intrinsic Amplitude: 2.125 mV
Lead Channel Setting Pacing Amplitude: 2 V
Lead Channel Setting Pacing Amplitude: 2 V
Lead Channel Setting Pacing Pulse Width: 0.4 ms
Lead Channel Setting Sensing Sensitivity: 2 mV

## 2016-03-04 ENCOUNTER — Telehealth: Payer: Self-pay | Admitting: Cardiology

## 2016-03-04 ENCOUNTER — Ambulatory Visit (INDEPENDENT_AMBULATORY_CARE_PROVIDER_SITE_OTHER): Payer: BLUE CROSS/BLUE SHIELD | Admitting: *Deleted

## 2016-03-04 DIAGNOSIS — I495 Sick sinus syndrome: Secondary | ICD-10-CM | POA: Diagnosis not present

## 2016-03-04 NOTE — Telephone Encounter (Signed)
LMOVM reminding pt to send remote transmission.   

## 2016-03-04 NOTE — Progress Notes (Signed)
Remote pacemaker transmission.   

## 2016-03-05 ENCOUNTER — Encounter: Payer: Self-pay | Admitting: Cardiology

## 2016-03-24 LAB — CUP PACEART REMOTE DEVICE CHECK
Battery Remaining Longevity: 93 mo
Battery Voltage: 3.01 V
Brady Statistic AP VP Percent: 0.19 %
Brady Statistic AP VS Percent: 97.6 %
Brady Statistic AS VP Percent: 0 %
Brady Statistic AS VS Percent: 2.21 %
Brady Statistic RA Percent Paced: 97.79 %
Brady Statistic RV Percent Paced: 0.2 %
Date Time Interrogation Session: 20170816163048
Implantable Lead Implant Date: 20150729
Implantable Lead Implant Date: 20150729
Implantable Lead Location: 753859
Implantable Lead Location: 753860
Implantable Lead Model: 5076
Implantable Lead Model: 5076
Lead Channel Impedance Value: 380 Ohm
Lead Channel Impedance Value: 513 Ohm
Lead Channel Impedance Value: 551 Ohm
Lead Channel Impedance Value: 608 Ohm
Lead Channel Pacing Threshold Amplitude: 0.75 V
Lead Channel Pacing Threshold Amplitude: 1 V
Lead Channel Pacing Threshold Pulse Width: 0.4 ms
Lead Channel Pacing Threshold Pulse Width: 0.4 ms
Lead Channel Sensing Intrinsic Amplitude: 1.875 mV
Lead Channel Sensing Intrinsic Amplitude: 1.875 mV
Lead Channel Sensing Intrinsic Amplitude: 11.375 mV
Lead Channel Sensing Intrinsic Amplitude: 11.375 mV
Lead Channel Setting Pacing Amplitude: 2 V
Lead Channel Setting Pacing Amplitude: 2 V
Lead Channel Setting Pacing Pulse Width: 0.4 ms
Lead Channel Setting Sensing Sensitivity: 2 mV

## 2016-04-02 ENCOUNTER — Other Ambulatory Visit: Payer: Self-pay | Admitting: Cardiovascular Disease

## 2016-04-02 ENCOUNTER — Ambulatory Visit
Admission: RE | Admit: 2016-04-02 | Discharge: 2016-04-02 | Disposition: A | Discharge status: Home / Self Care | Payer: BLUE CROSS/BLUE SHIELD | Source: Ambulatory Visit | Attending: Cardiovascular Disease | Admitting: Cardiovascular Disease

## 2016-04-02 DIAGNOSIS — J40 Bronchitis, not specified as acute or chronic: Secondary | ICD-10-CM

## 2016-04-16 ENCOUNTER — Inpatient Hospital Stay (HOSPITAL_COMMUNITY)
Admission: AD | Admit: 2016-04-16 | Discharge: 2016-04-21 | DRG: 194 | Disposition: A | Discharge status: Home / Self Care | Payer: BLUE CROSS/BLUE SHIELD | Source: Ambulatory Visit | Attending: Cardiovascular Disease | Admitting: Cardiovascular Disease

## 2016-04-16 DIAGNOSIS — Z79899 Other long term (current) drug therapy: Secondary | ICD-10-CM

## 2016-04-16 DIAGNOSIS — E1159 Type 2 diabetes mellitus with other circulatory complications: Secondary | ICD-10-CM | POA: Diagnosis present

## 2016-04-16 DIAGNOSIS — E1122 Type 2 diabetes mellitus with diabetic chronic kidney disease: Secondary | ICD-10-CM | POA: Diagnosis present

## 2016-04-16 DIAGNOSIS — Z950 Presence of cardiac pacemaker: Secondary | ICD-10-CM | POA: Diagnosis present

## 2016-04-16 DIAGNOSIS — K5090 Crohn's disease, unspecified, without complications: Secondary | ICD-10-CM | POA: Diagnosis present

## 2016-04-16 DIAGNOSIS — Z7951 Long term (current) use of inhaled steroids: Secondary | ICD-10-CM | POA: Diagnosis not present

## 2016-04-16 DIAGNOSIS — I350 Nonrheumatic aortic (valve) stenosis: Secondary | ICD-10-CM | POA: Diagnosis present

## 2016-04-16 DIAGNOSIS — G4733 Obstructive sleep apnea (adult) (pediatric): Secondary | ICD-10-CM | POA: Diagnosis present

## 2016-04-16 DIAGNOSIS — J189 Pneumonia, unspecified organism: Principal | ICD-10-CM | POA: Diagnosis present

## 2016-04-16 DIAGNOSIS — E669 Obesity, unspecified: Secondary | ICD-10-CM | POA: Diagnosis present

## 2016-04-16 DIAGNOSIS — N184 Chronic kidney disease, stage 4 (severe): Secondary | ICD-10-CM | POA: Diagnosis present

## 2016-04-16 DIAGNOSIS — K219 Gastro-esophageal reflux disease without esophagitis: Secondary | ICD-10-CM | POA: Diagnosis present

## 2016-04-16 DIAGNOSIS — Z6832 Body mass index (BMI) 32.0-32.9, adult: Secondary | ICD-10-CM | POA: Diagnosis not present

## 2016-04-16 DIAGNOSIS — I129 Hypertensive chronic kidney disease with stage 1 through stage 4 chronic kidney disease, or unspecified chronic kidney disease: Secondary | ICD-10-CM | POA: Diagnosis present

## 2016-04-16 DIAGNOSIS — E7800 Pure hypercholesterolemia, unspecified: Secondary | ICD-10-CM | POA: Diagnosis present

## 2016-04-16 DIAGNOSIS — Z8249 Family history of ischemic heart disease and other diseases of the circulatory system: Secondary | ICD-10-CM

## 2016-04-16 DIAGNOSIS — Z7982 Long term (current) use of aspirin: Secondary | ICD-10-CM | POA: Diagnosis not present

## 2016-04-16 DIAGNOSIS — Z85828 Personal history of other malignant neoplasm of skin: Secondary | ICD-10-CM

## 2016-04-16 DIAGNOSIS — I10 Essential (primary) hypertension: Secondary | ICD-10-CM

## 2016-04-16 DIAGNOSIS — R05 Cough: Secondary | ICD-10-CM | POA: Diagnosis present

## 2016-04-16 LAB — COMPREHENSIVE METABOLIC PANEL
ALT: 21 U/L (ref 14–54)
AST: 24 U/L (ref 15–41)
Albumin: 3.5 g/dL (ref 3.5–5.0)
Alkaline Phosphatase: 107 U/L (ref 38–126)
Anion gap: 8 (ref 5–15)
BUN: 34 mg/dL — ABNORMAL HIGH (ref 6–20)
CO2: 25 mmol/L (ref 22–32)
Calcium: 9.2 mg/dL (ref 8.9–10.3)
Chloride: 105 mmol/L (ref 101–111)
Creatinine, Ser: 1.9 mg/dL — ABNORMAL HIGH (ref 0.44–1.00)
GFR calc Af Amer: 28 mL/min — ABNORMAL LOW (ref >60)
GFR calc non Af Amer: 25 mL/min — ABNORMAL LOW (ref >60)
Glucose, Bld: 108 mg/dL — ABNORMAL HIGH (ref 65–99)
Potassium: 4 mmol/L (ref 3.5–5.1)
Sodium: 138 mmol/L (ref 135–145)
Total Bilirubin: 0.3 mg/dL (ref 0.3–1.2)
Total Protein: 7.2 g/dL (ref 6.5–8.1)

## 2016-04-16 LAB — CBC WITH DIFFERENTIAL/PLATELET
Basophils Absolute: 0.1 10*3/uL (ref 0.0–0.1)
Basophils Relative: 1 %
Eosinophils Absolute: 0.2 10*3/uL (ref 0.0–0.7)
Eosinophils Relative: 2 %
HCT: 44.9 % (ref 36.0–46.0)
Hemoglobin: 14.5 g/dL (ref 12.0–15.0)
Lymphocytes Relative: 36 %
Lymphs Abs: 2.8 10*3/uL (ref 0.7–4.0)
MCH: 29.2 pg (ref 26.0–34.0)
MCHC: 32.3 g/dL (ref 30.0–36.0)
MCV: 90.5 fL (ref 78.0–100.0)
Monocytes Absolute: 0.7 10*3/uL (ref 0.1–1.0)
Monocytes Relative: 8 %
Neutro Abs: 4.2 10*3/uL (ref 1.7–7.7)
Neutrophils Relative %: 53 %
Platelets: 231 10*3/uL (ref 150–400)
RBC: 4.96 MIL/uL (ref 3.87–5.11)
RDW: 14.5 % (ref 11.5–15.5)
WBC: 7.9 10*3/uL (ref 4.0–10.5)

## 2016-04-16 MED ORDER — DEXTROSE 5 % IV SOLN
1.0000 g | INTRAVENOUS | Status: DC
  Administered 2016-04-16 – 2016-04-20 (×5): 1 g via INTRAVENOUS
  Filled 2016-04-16 (×6): qty 10

## 2016-04-16 MED ORDER — DOCUSATE SODIUM 100 MG PO CAPS
100.0000 mg | ORAL_CAPSULE | Freq: Two times a day (BID) | ORAL | Status: DC
  Administered 2016-04-16 – 2016-04-21 (×10): 100 mg via ORAL
  Filled 2016-04-16 (×10): qty 1

## 2016-04-16 MED ORDER — PANTOPRAZOLE SODIUM 40 MG PO TBEC
40.0000 mg | DELAYED_RELEASE_TABLET | Freq: Every day | ORAL | Status: DC
  Administered 2016-04-16 – 2016-04-21 (×6): 40 mg via ORAL
  Filled 2016-04-16 (×6): qty 1

## 2016-04-16 MED ORDER — ACETAMINOPHEN 500 MG PO TABS
500.0000 mg | ORAL_TABLET | Freq: Every evening | ORAL | Status: DC | PRN
  Administered 2016-04-16: 500 mg via ORAL
  Filled 2016-04-16: qty 1

## 2016-04-16 MED ORDER — AZITHROMYCIN 500 MG PO TABS
500.0000 mg | ORAL_TABLET | Freq: Every day | ORAL | Status: AC
  Administered 2016-04-16 – 2016-04-18 (×3): 500 mg via ORAL
  Filled 2016-04-16 (×3): qty 1

## 2016-04-16 MED ORDER — ENOXAPARIN SODIUM 30 MG/0.3ML ~~LOC~~ SOLN
30.0000 mg | SUBCUTANEOUS | Status: DC
  Administered 2016-04-16 – 2016-04-20 (×5): 30 mg via SUBCUTANEOUS
  Filled 2016-04-16 (×5): qty 0.3

## 2016-04-16 MED ORDER — ACETAMINOPHEN 325 MG PO TABS
650.0000 mg | ORAL_TABLET | Freq: Four times a day (QID) | ORAL | Status: DC | PRN
  Administered 2016-04-20: 650 mg via ORAL
  Filled 2016-04-16: qty 2

## 2016-04-16 MED ORDER — LOSARTAN POTASSIUM 50 MG PO TABS: 50.0000 mg | ORAL_TABLET | Freq: Every day | ORAL | Status: DC

## 2016-04-16 MED ORDER — ACETAMINOPHEN 650 MG RE SUPP: 650.0000 mg | Freq: Four times a day (QID) | RECTAL | Status: DC | PRN

## 2016-04-16 MED ORDER — CARVEDILOL 6.25 MG PO TABS
6.2500 mg | ORAL_TABLET | Freq: Two times a day (BID) | ORAL | Status: DC
  Administered 2016-04-16 – 2016-04-21 (×10): 6.25 mg via ORAL
  Filled 2016-04-16 (×10): qty 1

## 2016-04-16 MED ORDER — AMLODIPINE BESYLATE 5 MG PO TABS
2.5000 mg | ORAL_TABLET | Freq: Every day | ORAL | Status: DC
  Administered 2016-04-16 – 2016-04-21 (×6): 2.5 mg via ORAL
  Filled 2016-04-16 (×6): qty 1

## 2016-04-16 MED ORDER — DIPHENHYDRAMINE HCL 25 MG PO CAPS
25.0000 mg | ORAL_CAPSULE | Freq: Every evening | ORAL | Status: DC | PRN
  Administered 2016-04-16 – 2016-04-20 (×5): 25 mg via ORAL
  Filled 2016-04-16 (×5): qty 1

## 2016-04-16 NOTE — H&P (Signed)
Referring Physician:  Ricka Westra is an 75 y.o. female.                       Chief Complaint: Cough and chest pain  HPI: 75 year old female has right lung consolidation on chest x-ray with cough and chest pain.   Past Medical History:  Diagnosis Date  . CHF (congestive heart failure) ([place])   . Chronic back pain   . Crohn's disease ([place])   . GERD (gastroesophageal reflux disease)   . High cholesterol   . History of echocardiogram    Echo 12/16: EF 60-65%, no RWMA, Gr 2 DD, mild AS (mean 13 mmHg), mild AI, MAC, trivial MS, mild MR, mild LAE, mild RVE, mild to mod TR, PASP 36 mmHg  . Hypertension   . Migraine    "1-2 times/yr" (02/16/2014)  . Murmur, cardiac   . OSA on CPAP   . 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 Pulmonary hypertension ([place])   . Sinus bradycardia 01/29/2014  . Skin cancer 2003   "3 cut off face" (02/16/2014)  . Type II diabetes mellitus ([place])       Past Surgical History:  Procedure Laterality Date  . CARDIAC CATHETERIZATION  1995; 2005  . CATARACT EXTRACTION Bilateral 1982  . INSERT / REPLACE / REMOVE PACEMAKER  02/14/2014  . PERMANENT PACEMAKER INSERTION N/A 02/14/2014   Procedure: PERMANENT PACEMAKER INSERTION;  Surgeon: Deboraha Sprang, MD;  Location: [facility] CATH LAB;  Service: Cardiovascular;  Laterality: N/A;  . SKIN CANCER EXCISION  2003    "face X 3" (02/16/2014)  . TEMPORARY PACEMAKER INSERTION N/A 02/12/2014   Procedure: TEMPORARY PACEMAKER INSERTION;  Surgeon: Lorretta Harp, MD;  Location: [facility] CATH LAB;  Service: Cardiovascular;  Laterality: N/A;  . VAGINAL HYSTERECTOMY  1972    Family History  Problem Relation Age of Onset  . Adopted: Yes  . Hypertension Mother   . Heart disease Mother    Social History:  reports that she has never smoked. She has never used smokeless tobacco. She reports that she does not drink alcohol or use drugs.  Allergies: No Known Allergies  Medications Prior to Admission  Medication Sig Dispense Refill  . acetaminophen  (TYLENOL 8 HOUR) 650 MG CR tablet Take 1 tablet (650 mg total) by mouth every 8 (eight) hours as needed for pain. 90 tablet 3  . aspirin 81 MG EC tablet Take 1 tablet (81 mg total) by mouth daily. 30 tablet 5  . Biotin 1000 MCG tablet Take 1 tablet (1 mg total) by mouth daily. 30 tablet 11  . carvedilol (COREG) 6.25 MG tablet TAKE 1 TABLET BY MOUTH 2 TIMES DAILY WITH A MEAL. 60 tablet 2  . conjugated estrogens (PREMARIN) vaginal cream Place 1 Applicatorful vaginally daily. 30 g 2  . diphenhydramine-acetaminophen (TYLENOL PM) 25-500 MG TABS Take 1 tablet by mouth at bedtime as needed (for sleep).    Marland Kitchen estradiol (ESTRACE) 0.1 MG/GM vaginal cream 2 grams daily 42.5 g 2  . fluticasone (VERAMYST) 27.5 MCG/SPRAY nasal spray Place 2 sprays into the nose daily.    . furosemide (LASIX) 40 MG tablet Take 1 tablet (40 mg total) by mouth daily. 30 tablet 0  . hydrochlorothiazide (MICROZIDE) 12.5 MG capsule TAKE 1 CAPSULE BY MOUTH DAILY. 30 capsule 2  . losartan (COZAAR) 50 MG tablet TAKE 1 TABLET BY MOUTH DAILY. 30 tablet 2  . NIFEdipine (PROCARDIA-XL/ADALAT-CC/NIFEDICAL-XL) 30 MG 24 hr tablet TAKE 1 TABLET BY MOUTH  DAILY. 30 tablet 2  . pantoprazole (PROTONIX) 40 MG tablet TAKE 1 TABLET BY MOUTH DAILY. 30 tablet 2  . Polyethyl Glycol-Propyl Glycol (SYSTANE) 0.4-0.3 % SOLN Apply 2 drops to eye as needed. 10 mL 0  . sodium chloride (OCEAN) 0.65 % SOLN nasal spray Place 1 spray into both nostrils as needed (dry nose). 30 mL 0  . solifenacin (VESICARE) 5 MG tablet Take 5 mg by mouth daily.      No results found for this or any previous visit (from the past 48 hour(s)). No results found.  Review Of Systems Constitutional: No weight loss, night sweats, fever. Positive fatigue. HEENT: No headaches, dysphagia.  Cardiovascular: Positive chest pain, no dizziness or palpitation since on pacemaker. No leg edema. Respiratory: Positive cough and shortness of breath. GI: No GI bleed, nausea, vomiting or loss of  appetite. Skin: No rash or jaundice. GU: No dysuria or hematuria. No history of kidney stones. Musculoskeletal: Positive arthritis. Positive back pain. Neurology: No stroke or seizures. Positive slow gait. Psych: No psych admission for depression or anxiety. Positive mild memory loss.  Blood pressure (!) 123/58, pulse 72, temperature 98.5 F (36.9 C), temperature source Oral, resp. rate 16, SpO2 98 %. General appearance: alert, cooperative, appears stated age and mild distress Head: Normocephalic, atraumatic Eyes: conjunctivae pink, corneas clear. PERRL, EOM's intact. Sclera-non-icteric. Neck: no adenopathy, no carotid bruit, no JVD, supple, symmetrical, trachea midline and thyroid not enlarged. Resp: Scattered rhonchi right lung. Cardio: regular rate and rhythm, S1, S2 normal, no click, rub or gallop. III/VI systolic and II/VI diastolic murmur. Extremities: No cyanosis or edema. Skin: Warm and dry. No rashes or lesions Neurologic: Alert and oriented X 3, normal strength and tone. Normal coordination and slow gait with cane use.  Assessment/Plan Right lung pneumonia Chest pain Hypertension Obesity S/P permanent pacemaker Aortic valve stenosis  Admit/IV antibiotics/Home medications post blood work report.  Birdie Riddle, MD  04/16/2016, 6:04 PM

## 2016-04-16 NOTE — Progress Notes (Signed)
New Admission Note: direct admit  Arrival Method: wheelchair Mental Orientation: a/o x4 Telemetry:n/a Assessment: Completed Skin:clean dry intact IV: none Pain: none Tubes: none Safety Measures: Safety Fall Prevention Plan has been given, discussed and signed Admission: Completed Unit Orientation: Patient has been orientated to the room, unit and staff.  Family:family present  Orders have been reviewed and implemented. Will continue to monitor the patient. Call light has been placed within reach and bed alarm has been activated.   Retta Mac BSN, RN

## 2016-04-17 LAB — BASIC METABOLIC PANEL
Anion gap: 7 (ref 5–15)
BUN: 31 mg/dL — ABNORMAL HIGH (ref 6–20)
CO2: 26 mmol/L (ref 22–32)
Calcium: 9.4 mg/dL (ref 8.9–10.3)
Chloride: 105 mmol/L (ref 101–111)
Creatinine, Ser: 1.67 mg/dL — ABNORMAL HIGH (ref 0.44–1.00)
GFR calc Af Amer: 33 mL/min — ABNORMAL LOW (ref >60)
GFR calc non Af Amer: 29 mL/min — ABNORMAL LOW (ref >60)
Glucose, Bld: 97 mg/dL (ref 65–99)
Potassium: 4 mmol/L (ref 3.5–5.1)
Sodium: 138 mmol/L (ref 135–145)

## 2016-04-17 LAB — CBC
HCT: 46.9 % — ABNORMAL HIGH (ref 36.0–46.0)
Hemoglobin: 14.6 g/dL (ref 12.0–15.0)
MCH: 28.5 pg (ref 26.0–34.0)
MCHC: 31.1 g/dL (ref 30.0–36.0)
MCV: 91.6 fL (ref 78.0–100.0)
Platelets: 234 10*3/uL (ref 150–400)
RBC: 5.12 MIL/uL — ABNORMAL HIGH (ref 3.87–5.11)
RDW: 14.5 % (ref 11.5–15.5)
WBC: 6.4 10*3/uL (ref 4.0–10.5)

## 2016-04-17 MED ORDER — ASPIRIN EC 81 MG PO TBEC
81.0000 mg | DELAYED_RELEASE_TABLET | Freq: Every day | ORAL | Status: DC
  Administered 2016-04-17 – 2016-04-21 (×5): 81 mg via ORAL
  Filled 2016-04-17 (×5): qty 1

## 2016-04-17 MED ORDER — DARIFENACIN HYDROBROMIDE ER 7.5 MG PO TB24
7.5000 mg | ORAL_TABLET | Freq: Every day | ORAL | Status: DC
  Administered 2016-04-17 – 2016-04-20 (×4): 7.5 mg via ORAL
  Filled 2016-04-17 (×4): qty 1

## 2016-04-17 MED ORDER — LOSARTAN POTASSIUM 25 MG PO TABS
25.0000 mg | ORAL_TABLET | Freq: Every day | ORAL | Status: DC
  Administered 2016-04-17 – 2016-04-21 (×5): 25 mg via ORAL
  Filled 2016-04-17 (×5): qty 1

## 2016-04-17 MED ORDER — SODIUM CHLORIDE 0.9 % IV SOLN
INTRAVENOUS | Status: AC
  Administered 2016-04-17 – 2016-04-19 (×3): via INTRAVENOUS

## 2016-04-17 MED ORDER — POLYVINYL ALCOHOL 1.4 % OP SOLN
2.0000 [drp] | OPHTHALMIC | Status: DC | PRN
  Filled 2016-04-17: qty 15

## 2016-04-17 NOTE — Progress Notes (Signed)
Interpreter Lesle Chris for [employer]

## 2016-04-17 NOTE — Progress Notes (Signed)
Ref: Minerva Ends, MD   Subjective:  Feeling same. + cough.  Objective:  Vital Signs in the last 24 hours: Temp:  [98.2 F (36.8 C)-98.7 F (37.1 C)] 98.3 F (36.8 C) (09/29 0927) Pulse Rate:  [69-72] 71 (09/29 0927) Resp:  [12-16] 16 (09/29 0927) BP: (106-128)/(54-60) 106/55 (09/29 0927) SpO2:  [93 %-98 %] 98 % (09/29 0927) Weight:  [80.7 kg (178 lb)] 80.7 kg (178 lb) (09/28 2116)  Physical Exam: BP Readings from Last 1 Encounters:  04/17/16 (!) 106/55    Wt Readings from Last 1 Encounters:  04/16/16 80.7 kg (178 lb)    Weight change:   HEENT: [place]/AT, Eyes- PERL, EOMI, Conjunctiva-Pink, Sclera-Non-icteric Neck: No JVD, No bruit, Trachea midline. Lungs:  Coarse crackles right lung more than left. Cardiac:  Regular rhythm, normal S1 and S2, no S3. III/VI systolic and II/VI diastolic murmur. Abdomen:  Soft, non-tender. Extremities:  No edema present. No cyanosis. No clubbing. CNS: AxOx3, Cranial nerves grossly intact, moves all 4 extremities.  Skin: Warm and dry.   Intake/Output from previous day: 09/28 0701 - 09/29 0700 In: 290 [P.O.:240; IV Piggyback:50] Out: 0     Lab Results: BMET    Component Value Date/Time   NA 138 04/17/2016 0611   NA 138 04/16/2016 1836   NA 137 11/21/2015 1456   K 4.0 04/17/2016 0611   K 4.0 04/16/2016 1836   K 4.1 11/21/2015 1456   CL 105 04/17/2016 0611   CL 105 04/16/2016 1836   CL 100 11/21/2015 1456   CO2 26 04/17/2016 0611   CO2 25 04/16/2016 1836   CO2 28 11/21/2015 1456   GLUCOSE 97 04/17/2016 0611   GLUCOSE 108 (H) 04/16/2016 1836   GLUCOSE 142 (H) 11/21/2015 1456   BUN 31 (H) 04/17/2016 0611   BUN 34 (H) 04/16/2016 1836   BUN 29 (H) 11/21/2015 1456   CREATININE 1.67 (H) 04/17/2016 0611   CREATININE 1.90 (H) 04/16/2016 1836   CREATININE 1.64 (H) 11/21/2015 1456   CREATININE 1.48 (H) 09/16/2015 1228   CREATININE 1.69 (H) 07/23/2015 1207   CALCIUM 9.4 04/17/2016 0611   CALCIUM 9.2 04/16/2016 1836   CALCIUM 9.3  11/21/2015 1456   GFRNONAA 29 (L) 04/17/2016 0611   GFRNONAA 25 (L) 04/16/2016 1836   GFRNONAA 30 (L) 11/21/2015 1456   GFRNONAA 34 (L) 09/16/2015 1228   GFRNONAA 32 (L) 05/15/2015 1144   GFRAA 33 (L) 04/17/2016 0611   GFRAA 28 (L) 04/16/2016 1836   GFRAA 35 (L) 11/21/2015 1456   GFRAA 40 (L) 09/16/2015 1228   GFRAA 37 (L) 05/15/2015 1144   CBC    Component Value Date/Time   WBC 6.4 04/17/2016 0611   RBC 5.12 (H) 04/17/2016 0611   HGB 14.6 04/17/2016 0611   HCT 46.9 (H) 04/17/2016 0611   PLT 234 04/17/2016 0611   MCV 91.6 04/17/2016 0611   MCH 28.5 04/17/2016 0611   MCHC 31.1 04/17/2016 0611   RDW 14.5 04/17/2016 0611   LYMPHSABS 2.8 04/16/2016 1836   MONOABS 0.7 04/16/2016 1836   EOSABS 0.2 04/16/2016 1836   BASOSABS 0.1 04/16/2016 1836   HEPATIC Function Panel  Recent Labs  05/15/15 1144 09/16/15 1228 04/16/16 1836  PROT 7.4 7.8 7.2   HEMOGLOBIN A1C No components found for: HGA1C,  MPG CARDIAC ENZYMES Lab Results  Component Value Date   TROPONINI <0.30 02/13/2014   TROPONINI <0.30 02/13/2014   TROPONINI <0.30 02/12/2014   BNP No results for input(s): PROBNP in the  last 8760 hours. TSH No results for input(s): TSH in the last 8760 hours. CHOLESTEROL No results for input(s): CHOL in the last 8760 hours.  Scheduled Meds: . amLODipine  2.5 mg Oral Daily  . aspirin EC  81 mg Oral Daily  . azithromycin  500 mg Oral Daily  . carvedilol  6.25 mg Oral BID WC  . cefTRIAXone (ROCEPHIN)  IV  1 g Intravenous Q24H  . darifenacin  7.5 mg Oral QHS  . docusate sodium  100 mg Oral BID  . enoxaparin (LOVENOX) injection  30 mg Subcutaneous Q24H  . losartan  25 mg Oral Daily  . pantoprazole  40 mg Oral Daily   Continuous Infusions: . sodium chloride     PRN Meds:.acetaminophen **OR** acetaminophen, acetaminophen, diphenhydrAMINE, polyvinyl alcohol  Assessment/Plan: Right lung pneumonia Chest pain Hypertension Obesity S/P permanent pacemaker Aortic valve  stenosis  Flutter valve use. Echocardiogram    LOS: 1 day    Dixie Dials  MD  04/17/2016, 10:51 AM

## 2016-04-18 ENCOUNTER — Inpatient Hospital Stay (HOSPITAL_COMMUNITY): Payer: BLUE CROSS/BLUE SHIELD

## 2016-04-18 LAB — ECHOCARDIOGRAM COMPLETE
Height: 62 in
Weight: 2836 oz

## 2016-04-18 MED ORDER — PERFLUTREN LIPID MICROSPHERE
1.0000 mL | INTRAVENOUS | Status: AC | PRN
  Administered 2016-04-18: 2 mL via INTRAVENOUS
  Filled 2016-04-18: qty 10

## 2016-04-18 NOTE — Progress Notes (Signed)
Ref: Minerva Ends, MD   Subjective:  Feeling better. Afebrile.  Objective:  Vital Signs in the last 24 hours: Temp:  [97.5 F (36.4 C)-98.2 F (36.8 C)] 97.7 F (36.5 C) (09/30 0934) Pulse Rate:  [72-76] 73 (09/30 0934) Resp:  [16-19] 16 (09/30 0934) BP: (101-137)/(6-66) 101/53 (09/30 0934) SpO2:  [97 %-100 %] 100 % (09/30 0934) Weight:  [80.4 kg (177 lb 4 oz)] 80.4 kg (177 lb 4 oz) (09/29 2136)  Physical Exam: BP Readings from Last 1 Encounters:  04/18/16 (!) 101/53    Wt Readings from Last 1 Encounters:  04/17/16 80.4 kg (177 lb 4 oz)    Weight change: -0.34 kg (-12 oz)  HEENT: [place]/AT, Eyes-Brown, PERL, EOMI, Conjunctiva-Pink, Sclera-Non-icteric Neck: No JVD, No bruit, Trachea midline. Lungs:  Clearing, Bilateral. Cardiac:  Regular rhythm, normal S1 and S2, no S3. III/VI systolic and II/VI diastolic murmur. Abdomen:  Soft, non-tender. Extremities:  No edema present. No cyanosis. No clubbing. CNS: AxOx3, Cranial nerves grossly intact, moves all 4 extremities. Right handed. Skin: Warm and dry.   Intake/Output from previous day: 09/29 0701 - 09/30 0700 In: 1965 [P.O.:960; I.V.:955; IV Piggyback:50] Out: 0     Lab Results: BMET    Component Value Date/Time   NA 138 04/17/2016 0611   NA 138 04/16/2016 1836   NA 137 11/21/2015 1456   K 4.0 04/17/2016 0611   K 4.0 04/16/2016 1836   K 4.1 11/21/2015 1456   CL 105 04/17/2016 0611   CL 105 04/16/2016 1836   CL 100 11/21/2015 1456   CO2 26 04/17/2016 0611   CO2 25 04/16/2016 1836   CO2 28 11/21/2015 1456   GLUCOSE 97 04/17/2016 0611   GLUCOSE 108 (H) 04/16/2016 1836   GLUCOSE 142 (H) 11/21/2015 1456   BUN 31 (H) 04/17/2016 0611   BUN 34 (H) 04/16/2016 1836   BUN 29 (H) 11/21/2015 1456   CREATININE 1.67 (H) 04/17/2016 0611   CREATININE 1.90 (H) 04/16/2016 1836   CREATININE 1.64 (H) 11/21/2015 1456   CREATININE 1.48 (H) 09/16/2015 1228   CREATININE 1.69 (H) 07/23/2015 1207   CALCIUM 9.4 04/17/2016 0611   CALCIUM 9.2 04/16/2016 1836   CALCIUM 9.3 11/21/2015 1456   GFRNONAA 29 (L) 04/17/2016 0611   GFRNONAA 25 (L) 04/16/2016 1836   GFRNONAA 30 (L) 11/21/2015 1456   GFRNONAA 34 (L) 09/16/2015 1228   GFRNONAA 32 (L) 05/15/2015 1144   GFRAA 33 (L) 04/17/2016 0611   GFRAA 28 (L) 04/16/2016 1836   GFRAA 35 (L) 11/21/2015 1456   GFRAA 40 (L) 09/16/2015 1228   GFRAA 37 (L) 05/15/2015 1144   CBC    Component Value Date/Time   WBC 6.4 04/17/2016 0611   RBC 5.12 (H) 04/17/2016 0611   HGB 14.6 04/17/2016 0611   HCT 46.9 (H) 04/17/2016 0611   PLT 234 04/17/2016 0611   MCV 91.6 04/17/2016 0611   MCH 28.5 04/17/2016 0611   MCHC 31.1 04/17/2016 0611   RDW 14.5 04/17/2016 0611   LYMPHSABS 2.8 04/16/2016 1836   MONOABS 0.7 04/16/2016 1836   EOSABS 0.2 04/16/2016 1836   BASOSABS 0.1 04/16/2016 1836   HEPATIC Function Panel  Recent Labs  05/15/15 1144 09/16/15 1228 04/16/16 1836  PROT 7.4 7.8 7.2   HEMOGLOBIN A1C No components found for: HGA1C,  MPG CARDIAC ENZYMES Lab Results  Component Value Date   TROPONINI <0.30 02/13/2014   TROPONINI <0.30 02/13/2014   TROPONINI <0.30 02/12/2014   BNP No results for  input(s): PROBNP in the last 8760 hours. TSH No results for input(s): TSH in the last 8760 hours. CHOLESTEROL No results for input(s): CHOL in the last 8760 hours.  Scheduled Meds: . amLODipine  2.5 mg Oral Daily  . aspirin EC  81 mg Oral Daily  . carvedilol  6.25 mg Oral BID WC  . cefTRIAXone (ROCEPHIN)  IV  1 g Intravenous Q24H  . darifenacin  7.5 mg Oral QHS  . docusate sodium  100 mg Oral BID  . enoxaparin (LOVENOX) injection  30 mg Subcutaneous Q24H  . losartan  25 mg Oral Daily  . pantoprazole  40 mg Oral Daily   Continuous Infusions: . sodium chloride 50 mL/hr at 04/18/16 0507   PRN Meds:.acetaminophen **OR** acetaminophen, acetaminophen, diphenhydrAMINE, polyvinyl alcohol  Assessment/Plan: Right lung pneumonia Chest pain Hypertension Obesity S/P  permanent Pacemaker Aortic valve stenosis  Continue medical treatment.   LOS: 2 days    Dixie Dials  MD  04/18/2016, 11:11 AM

## 2016-04-18 NOTE — Progress Notes (Signed)
  Echocardiogram 2D Echocardiogram with Definity has been performed.  Heather [name] M 04/18/2016, 2:13 PM

## 2016-04-19 NOTE — Progress Notes (Signed)
Ref: Minerva Ends, MD   Subjective:  Decreasing cough. Afebrile.  Objective:  Vital Signs in the last 24 hours: Temp:  [98.1 F (36.7 C)-98.5 F (36.9 C)] 98.5 F (36.9 C) (10/01 0959) Pulse Rate:  [69-72] 69 (10/01 0959) Resp:  [18] 18 (10/01 0959) BP: (131-142)/(60-67) 142/60 (10/01 0959) SpO2:  [98 %] 98 % (10/01 0959) Weight:  [83.9 kg (184 lb 15.5 oz)] 83.9 kg (184 lb 15.5 oz) (09/30 2118)  Physical Exam: BP Readings from Last 1 Encounters:  04/19/16 (!) 142/60    Wt Readings from Last 1 Encounters:  04/18/16 83.9 kg (184 lb 15.5 oz)    Weight change: 3.5 kg (7 lb 11.5 oz)  HEENT: [place]/AT, Eyes-Brown, PERL, EOMI, Conjunctiva-Pink, Sclera-Non-icteric Neck: No JVD, No bruit, Trachea midline. Lungs:  Clearing, Bilateral. Cardiac:  Regular rhythm, normal S1 and S2, no S3. III/VI systolic and II/VI diastolic murmur. Abdomen:  Soft, non-tender. Extremities:  No edema present. No cyanosis. No clubbing. CNS: AxOx3, Cranial nerves grossly intact, moves all 4 extremities. Right handed. Skin: Warm and dry.   Intake/Output from previous day: 09/30 0701 - 10/01 0700 In: 1560 [P.O.:960; I.V.:600] Out: 0     Lab Results: BMET    Component Value Date/Time   NA 138 04/17/2016 0611   NA 138 04/16/2016 1836   NA 137 11/21/2015 1456   K 4.0 04/17/2016 0611   K 4.0 04/16/2016 1836   K 4.1 11/21/2015 1456   CL 105 04/17/2016 0611   CL 105 04/16/2016 1836   CL 100 11/21/2015 1456   CO2 26 04/17/2016 0611   CO2 25 04/16/2016 1836   CO2 28 11/21/2015 1456   GLUCOSE 97 04/17/2016 0611   GLUCOSE 108 (H) 04/16/2016 1836   GLUCOSE 142 (H) 11/21/2015 1456   BUN 31 (H) 04/17/2016 0611   BUN 34 (H) 04/16/2016 1836   BUN 29 (H) 11/21/2015 1456   CREATININE 1.67 (H) 04/17/2016 0611   CREATININE 1.90 (H) 04/16/2016 1836   CREATININE 1.64 (H) 11/21/2015 1456   CREATININE 1.48 (H) 09/16/2015 1228   CREATININE 1.69 (H) 07/23/2015 1207   CALCIUM 9.4 04/17/2016 0611   CALCIUM  9.2 04/16/2016 1836   CALCIUM 9.3 11/21/2015 1456   GFRNONAA 29 (L) 04/17/2016 0611   GFRNONAA 25 (L) 04/16/2016 1836   GFRNONAA 30 (L) 11/21/2015 1456   GFRNONAA 34 (L) 09/16/2015 1228   GFRNONAA 32 (L) 05/15/2015 1144   GFRAA 33 (L) 04/17/2016 0611   GFRAA 28 (L) 04/16/2016 1836   GFRAA 35 (L) 11/21/2015 1456   GFRAA 40 (L) 09/16/2015 1228   GFRAA 37 (L) 05/15/2015 1144   CBC    Component Value Date/Time   WBC 6.4 04/17/2016 0611   RBC 5.12 (H) 04/17/2016 0611   HGB 14.6 04/17/2016 0611   HCT 46.9 (H) 04/17/2016 0611   PLT 234 04/17/2016 0611   MCV 91.6 04/17/2016 0611   MCH 28.5 04/17/2016 0611   MCHC 31.1 04/17/2016 0611   RDW 14.5 04/17/2016 0611   LYMPHSABS 2.8 04/16/2016 1836   MONOABS 0.7 04/16/2016 1836   EOSABS 0.2 04/16/2016 1836   BASOSABS 0.1 04/16/2016 1836   HEPATIC Function Panel  Recent Labs  05/15/15 1144 09/16/15 1228 04/16/16 1836  PROT 7.4 7.8 7.2   HEMOGLOBIN A1C No components found for: HGA1C,  MPG CARDIAC ENZYMES Lab Results  Component Value Date   TROPONINI <0.30 02/13/2014   TROPONINI <0.30 02/13/2014   TROPONINI <0.30 02/12/2014   BNP No results for  input(s): PROBNP in the last 8760 hours. TSH No results for input(s): TSH in the last 8760 hours. CHOLESTEROL No results for input(s): CHOL in the last 8760 hours.  Scheduled Meds: . amLODipine  2.5 mg Oral Daily  . aspirin EC  81 mg Oral Daily  . carvedilol  6.25 mg Oral BID WC  . cefTRIAXone (ROCEPHIN)  IV  1 g Intravenous Q24H  . darifenacin  7.5 mg Oral QHS  . docusate sodium  100 mg Oral BID  . enoxaparin (LOVENOX) injection  30 mg Subcutaneous Q24H  . losartan  25 mg Oral Daily  . pantoprazole  40 mg Oral Daily   Continuous Infusions:  PRN Meds:.acetaminophen **OR** acetaminophen, acetaminophen, diphenhydrAMINE, polyvinyl alcohol  Assessment/Plan: Right lung pneumonia Hypertension Obesity S/P permanent pacemaker Aortic valve stenosis  Continue medical treatment.  Use incentive spirometry or flutter valve.   LOS: 3 days    Dixie Dials  MD  04/19/2016, 11:47 AM

## 2016-04-20 NOTE — Care Management Note (Signed)
Case Management Note  Patient Details  Name: Heather [name] MRN: [id number] Date of Birth: 1940-09-27  Subjective/Objective:  Presents with pna,lives with daughter, pta indep, she has pcp per daughter and she has medication coverage with insurance and she will have transportation at [employer]. Plan is for dc tomorrow.  NCM will cont to follow for dc needs.                   Action/Plan:   Expected Discharge Date:                  Expected Discharge Plan:  Home/Self Care  In-House Referral:     Discharge planning Services  CM Consult  Post Acute Care Choice:    Choice offered to:     DME Arranged:    DME Agency:     HH Arranged:    HH Agency:     Status of Service:  Completed, signed off  If discussed at [employer] of Stay Meetings, dates discussed:    Additional Comments:  Zenon Mayo, RN 04/20/2016, 6:14 PM

## 2016-04-20 NOTE — Progress Notes (Signed)
Ref: Minerva Ends, MD   Subjective:  Positive cough. Just started using flutter valve.  Objective:  Vital Signs in the last 24 hours: Temp:  [98 F (36.7 C)-98.1 F (36.7 C)] 98 F (36.7 C) (10/02 0537) Pulse Rate:  [69-73] 69 (10/02 0537) Resp:  [18] 18 (10/02 0537) BP: (134-144)/(69-93) 134/69 (10/02 0537) SpO2:  [96 %-98 %] 96 % (10/02 0537) Weight:  [84 kg (185 lb 3 oz)] 84 kg (185 lb 3 oz) (10/01 2132)  Physical Exam: BP Readings from Last 1 Encounters:  04/20/16 134/69    Wt Readings from Last 1 Encounters:  04/19/16 84 kg (185 lb 3 oz)    Weight change: 0.1 kg (3.5 oz)  HEENT: [place]/AT, Eyes-Brown, PERL, EOMI, Conjunctiva-Pink, Sclera-Non-icteric Neck: No JVD, No bruit, Trachea midline. Lungs:  Coarse crackles, Bilateral. Cardiac:  Regular rhythm, normal S1 and S2, no S3. III/VI systolic and II/VI diastolic murmur.  Abdomen:  Soft, non-tender. Extremities:  No edema present. No cyanosis. No clubbing. CNS: AxOx3, Cranial nerves grossly intact, moves all 4 extremities.  Skin: Warm and dry.   Intake/Output from previous day: 10/01 0701 - 10/02 0700 In: 1160 [P.O.:960; I.V.:200] Out: 200 [Urine:200]    Lab Results: BMET    Component Value Date/Time   NA 138 04/17/2016 0611   NA 138 04/16/2016 1836   NA 137 11/21/2015 1456   K 4.0 04/17/2016 0611   K 4.0 04/16/2016 1836   K 4.1 11/21/2015 1456   CL 105 04/17/2016 0611   CL 105 04/16/2016 1836   CL 100 11/21/2015 1456   CO2 26 04/17/2016 0611   CO2 25 04/16/2016 1836   CO2 28 11/21/2015 1456   GLUCOSE 97 04/17/2016 0611   GLUCOSE 108 (H) 04/16/2016 1836   GLUCOSE 142 (H) 11/21/2015 1456   BUN 31 (H) 04/17/2016 0611   BUN 34 (H) 04/16/2016 1836   BUN 29 (H) 11/21/2015 1456   CREATININE 1.67 (H) 04/17/2016 0611   CREATININE 1.90 (H) 04/16/2016 1836   CREATININE 1.64 (H) 11/21/2015 1456   CREATININE 1.48 (H) 09/16/2015 1228   CREATININE 1.69 (H) 07/23/2015 1207   CALCIUM 9.4 04/17/2016 0611   CALCIUM 9.2 04/16/2016 1836   CALCIUM 9.3 11/21/2015 1456   GFRNONAA 29 (L) 04/17/2016 0611   GFRNONAA 25 (L) 04/16/2016 1836   GFRNONAA 30 (L) 11/21/2015 1456   GFRNONAA 34 (L) 09/16/2015 1228   GFRNONAA 32 (L) 05/15/2015 1144   GFRAA 33 (L) 04/17/2016 0611   GFRAA 28 (L) 04/16/2016 1836   GFRAA 35 (L) 11/21/2015 1456   GFRAA 40 (L) 09/16/2015 1228   GFRAA 37 (L) 05/15/2015 1144   CBC    Component Value Date/Time   WBC 6.4 04/17/2016 0611   RBC 5.12 (H) 04/17/2016 0611   HGB 14.6 04/17/2016 0611   HCT 46.9 (H) 04/17/2016 0611   PLT 234 04/17/2016 0611   MCV 91.6 04/17/2016 0611   MCH 28.5 04/17/2016 0611   MCHC 31.1 04/17/2016 0611   RDW 14.5 04/17/2016 0611   LYMPHSABS 2.8 04/16/2016 1836   MONOABS 0.7 04/16/2016 1836   EOSABS 0.2 04/16/2016 1836   BASOSABS 0.1 04/16/2016 1836   HEPATIC Function Panel  Recent Labs  05/15/15 1144 09/16/15 1228 04/16/16 1836  PROT 7.4 7.8 7.2   HEMOGLOBIN A1C No components found for: HGA1C,  MPG CARDIAC ENZYMES Lab Results  Component Value Date   TROPONINI <0.30 02/13/2014   TROPONINI <0.30 02/13/2014   TROPONINI <0.30 02/12/2014   BNP No  results for input(s): PROBNP in the last 8760 hours. TSH No results for input(s): TSH in the last 8760 hours. CHOLESTEROL No results for input(s): CHOL in the last 8760 hours.  Scheduled Meds: . amLODipine  2.5 mg Oral Daily  . aspirin EC  81 mg Oral Daily  . carvedilol  6.25 mg Oral BID WC  . cefTRIAXone (ROCEPHIN)  IV  1 g Intravenous Q24H  . darifenacin  7.5 mg Oral QHS  . docusate sodium  100 mg Oral BID  . enoxaparin (LOVENOX) injection  30 mg Subcutaneous Q24H  . losartan  25 mg Oral Daily  . pantoprazole  40 mg Oral Daily   Continuous Infusions:  PRN Meds:.acetaminophen **OR** acetaminophen, acetaminophen, diphenhydrAMINE, polyvinyl alcohol  Assessment/Plan: Right lung pneumonia Hypertension Obesity S/P permanent pacemaker Aortic valve stenosis, mild  Continue  medical treatment.   LOS: 4 days    Dixie Dials  MD  04/20/2016, 10:01 AM

## 2016-04-21 MED ORDER — AMLODIPINE BESYLATE 5 MG PO TABS: 5.0000 mg | ORAL_TABLET | Freq: Every day | ORAL | 1 refills | Status: DC

## 2016-04-21 MED ORDER — GUAIFENESIN ER 600 MG PO TB12: 600.0000 mg | ORAL_TABLET | Freq: Two times a day (BID) | ORAL | 0 refills | Status: DC

## 2016-04-21 MED ORDER — LEVOFLOXACIN 500 MG PO TABS: 500.0000 mg | ORAL_TABLET | Freq: Every day | ORAL | 0 refills | Status: DC

## 2016-04-21 MED ORDER — GUAIFENESIN ER 600 MG PO TB12
600.0000 mg | ORAL_TABLET | Freq: Two times a day (BID) | ORAL | Status: DC
  Administered 2016-04-21: 600 mg via ORAL
  Filled 2016-04-21: qty 1

## 2016-04-21 NOTE — Discharge Summary (Signed)
Physician Discharge Summary  Patient ID: Heather [name] MRN: [id number] DOB/AGE: 76-Mar-1942 76 y.o.  Admit date: 04/16/2016 Discharge date: 04/21/2016  Admission Diagnoses:   Right lung pneumonia   Hypertension   Obesity   S/P permanent pacemaker   Aortic valve stenosis, mild  Discharge Diagnoses:  Principal Problem:   Pneumonia involving right lung Active Problems:   Hypertension   Pacemaker-Medtronic   CKD (chronic kidney disease) stage 4, GFR 15-29 ml/min (HCC)   Mild aortic valve stenosis   Obesity  Discharged Condition: fair  Hospital Course: 75 year old female had cough, chest pain and right lung consolidation on X-ray. She responded to IV antibiotic and oral zithromax. She needed flutter valve to beak up the mucus. Her echocardiogram showed mild LV hypertrophy with mild Aortic valve stenosis.  Consults: None  Significant Diagnostic Studies: labs: Normal CBC. Normal BMET except BUN/Cr of 34/1.90.   Chest x-ray: Right lung atelectasis v/s pneumonia.  Echocardiogram: Mild LVH, normal systolic function, mild diastolic dysfunction. Mild MR and mild AS.  Treatments: antibiotics: Levaquin, ceftriaxone and azithromycin  Discharge Exam: Blood pressure (!) 149/81, pulse 85, temperature 97.8 F (36.6 C), temperature source Oral, resp. rate 18, height 5' 2"  (1.575 m), weight 84 kg (185 lb 3 oz), SpO2 97 %. General appearance: alert, cooperative and appears stated age. Head: Normocephalic, atraumatic. Eyes: conjunctivae pink, corneas clear. PERRL, EOM's intact.  Neck: no adenopathy, no carotid bruit, no JVD, supple, symmetrical, trachea midline and thyroid not enlarged. Resp: Few coarse crackles on right side to auscultation bilaterally Cardio: regular rate and rhythm, S1, S2 normal, no click, rub or gallop. III/VI systolic and II/VI diastolic murmur. GI: soft, non-tender; bowel sounds normal; no masses,  no organomegaly Extremities: extremities normal, no cyanosis or  edema Skin: Warm and dry. No rashes or lesions Neurologic: Alert and oriented X 3, normal strength and tone. Normal coordination and slow gait with cane use.  Disposition: 01-Home or Self Care     Medication List    TAKE these medications   acetaminophen 650 MG CR tablet Commonly known as:  TYLENOL 8 HOUR Take 1 tablet (650 mg total) by mouth every 8 (eight) hours as needed for pain.   amLODipine 5 MG tablet Commonly known as:  NORVASC Take 1 tablet (5 mg total) by mouth daily. What changed:  medication strength  how much to take   aspirin 81 MG EC tablet Take 1 tablet (81 mg total) by mouth daily.   Biotin 1000 MCG tablet Take 1 tablet (1 mg total) by mouth daily.   carvedilol 6.25 MG tablet Commonly known as:  COREG TAKE 1 TABLET BY MOUTH 2 TIMES DAILY WITH A MEAL.   diphenhydramine-acetaminophen 25-500 MG Tabs tablet Commonly known as:  TYLENOL PM Take 1 tablet by mouth at bedtime as needed (for sleep).   guaiFENesin 600 MG 12 hr tablet Commonly known as:  MUCINEX Take 1 tablet (600 mg total) by mouth 2 (two) times daily.   hydrochlorothiazide 12.5 MG capsule Commonly known as:  MICROZIDE TAKE 1 CAPSULE BY MOUTH DAILY.   levofloxacin 500 MG tablet Commonly known as:  LEVAQUIN Take 1 tablet (500 mg total) by mouth daily.   losartan 50 MG tablet Commonly known as:  COZAAR TAKE 1 TABLET BY MOUTH DAILY.   pantoprazole 40 MG tablet Commonly known as:  PROTONIX TAKE 1 TABLET BY MOUTH DAILY.   Polyethyl Glycol-Propyl Glycol 0.4-0.3 % Soln Commonly known as:  SYSTANE Apply 2 drops to eye as needed.  sodium chloride 0.65 % Soln nasal spray Commonly known as:  OCEAN Place 1 spray into both nostrils as needed (dry nose).      Follow-up Information    Minerva Ends, MD. Schedule an appointment as soon as possible for a visit in 1 month(s).   Specialty:  Family Medicine Contact information: [place] [address] [phone]         [facility], MD. Schedule an appointment as soon as possible for a visit in 1 week(s).   Specialty:  Cardiology Contact information: [place] [address] [phone]           Signed: Birdie Riddle 04/21/2016, 9:06 AM

## 2016-04-21 NOTE — Progress Notes (Signed)
Patient Discharge: Disposition: Patient discharged home with daughter. Education: Reviewed medications, prescriptions, follow-up appointments and discharge instructions, understood and acknowledged. IV: Discontinued IV before discharge. Telemetry: N/A Transportation: Patient escorted in w/c with daughter accompanying out of the unit. Belongings: Patient took all her belongings with her.  Patient's daughter said that she changed the pharmacy to [facility] on [address], informed Dr. Doylene Canard and he suggested patient to stop by at his clinic to collect the prescriptions.  Patient's daughter was okay with it.

## 2016-04-22 ENCOUNTER — Ambulatory Visit: Payer: BLUE CROSS/BLUE SHIELD | Attending: Critical Care Medicine | Admitting: Critical Care Medicine

## 2016-04-22 ENCOUNTER — Encounter: Payer: Self-pay | Admitting: Critical Care Medicine

## 2016-04-22 VITALS — BP 133/81 | HR 78 | Temp 98.5°F | Resp 18 | Ht 62.0 in | Wt 181.2 lb

## 2016-04-22 DIAGNOSIS — Z950 Presence of cardiac pacemaker: Secondary | ICD-10-CM | POA: Diagnosis not present

## 2016-04-22 DIAGNOSIS — Z79899 Other long term (current) drug therapy: Secondary | ICD-10-CM | POA: Insufficient documentation

## 2016-04-22 DIAGNOSIS — E119 Type 2 diabetes mellitus without complications: Secondary | ICD-10-CM | POA: Diagnosis not present

## 2016-04-22 DIAGNOSIS — E7800 Pure hypercholesterolemia, unspecified: Secondary | ICD-10-CM | POA: Insufficient documentation

## 2016-04-22 DIAGNOSIS — I110 Hypertensive heart disease with heart failure: Secondary | ICD-10-CM | POA: Diagnosis not present

## 2016-04-22 DIAGNOSIS — K5090 Crohn's disease, unspecified, without complications: Secondary | ICD-10-CM | POA: Insufficient documentation

## 2016-04-22 DIAGNOSIS — Z8249 Family history of ischemic heart disease and other diseases of the circulatory system: Secondary | ICD-10-CM | POA: Insufficient documentation

## 2016-04-22 DIAGNOSIS — G4733 Obstructive sleep apnea (adult) (pediatric): Secondary | ICD-10-CM | POA: Insufficient documentation

## 2016-04-22 DIAGNOSIS — I272 Pulmonary hypertension, unspecified: Secondary | ICD-10-CM | POA: Insufficient documentation

## 2016-04-22 DIAGNOSIS — J189 Pneumonia, unspecified organism: Secondary | ICD-10-CM | POA: Insufficient documentation

## 2016-04-22 DIAGNOSIS — Z85828 Personal history of other malignant neoplasm of skin: Secondary | ICD-10-CM | POA: Diagnosis not present

## 2016-04-22 DIAGNOSIS — I5032 Chronic diastolic (congestive) heart failure: Secondary | ICD-10-CM | POA: Diagnosis not present

## 2016-04-22 DIAGNOSIS — K219 Gastro-esophageal reflux disease without esophagitis: Secondary | ICD-10-CM | POA: Insufficient documentation

## 2016-04-22 DIAGNOSIS — Z7982 Long term (current) use of aspirin: Secondary | ICD-10-CM | POA: Diagnosis not present

## 2016-04-22 DIAGNOSIS — J181 Lobar pneumonia, unspecified organism: Secondary | ICD-10-CM

## 2016-04-22 NOTE — Progress Notes (Signed)
Patient is here for HFU for Pneumonia  Patient denies pain at this time.  Patient has taken medication today and patient has eaten today.  Patient would like the flu vaccine today if present illness does not prevent.

## 2016-04-22 NOTE — Patient Instructions (Addendum)
Obtain Chest xray on 05/25/16 Follow up with Dr Joya Gaskins on 05/27/16 Get levaquin filled and take until gone No other changes Follow up with Dr Adrian Blackwater

## 2016-04-22 NOTE — Progress Notes (Signed)
Subjective:    Patient ID: Heather [name], female    DOB: [birth year], 75 y.o.   MRN: [id number]  Followup pneumonia  Adm 9/28 - 10/3.  Hospital Course: 75 year old female had cough, chest pain and right lung consolidation on X-ray. She responded to IV antibiotic and oral zithromax. She needed flutter valve to beak up the mucus. Her echocardiogram showed mild LV hypertrophy with mild Aortic valve stenosis.    Consults: None    Significant Diagnostic Studies: labs: Normal CBC. Normal BMET except BUN/Cr of 34/1.90.   Chest x-ray: Right lung atelectasis v/s pneumonia.  Echocardiogram: Mild LVH, normal systolic function, mild diastolic dysfunction. Mild MR and mild AS.     Pneumonia  She complains of chest tightness, cough, difficulty breathing, shortness of breath and wheezing. There is no frequent throat clearing, hemoptysis, hoarse voice or sputum production. This is a new problem. The current episode started in the past 7 days. The problem occurs constantly. The problem has been rapidly improving. The cough is non-productive and dry. Associated symptoms include chest pain and trouble swallowing. Pertinent negatives include no appetite change, dyspnea on exertion, fever, malaise/fatigue, myalgias, orthopnea, PND, postnasal drip, rhinorrhea, sneezing or sore throat.   Past Medical History:  Diagnosis Date  . CHF (congestive heart failure) ([place])   . Chronic back pain   . Crohn's disease ([place])   . GERD (gastroesophageal reflux disease)   . High cholesterol   . History of echocardiogram    Echo 12/16: EF 60-65%, no RWMA, Gr 2 DD, mild AS (mean 13 mmHg), mild AI, MAC, trivial MS, mild MR, mild LAE, mild RVE, mild to mod TR, PASP 36 mmHg  . Hypertension   . Migraine    "1-2 times/yr" (02/16/2014)  . Murmur, cardiac   . OSA on CPAP   . 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 Pulmonary hypertension   . Sinus bradycardia 01/29/2014  . Skin cancer 2003   "3 cut off face" (02/16/2014)    . Type II diabetes mellitus (HCC)      Family History  Problem Relation Age of Onset  . Adopted: Yes  . Hypertension Mother   . Heart disease Mother      Social History   Social History  . Marital status: Widowed    Spouse name: N/A  . Number of children: N/A  . Years of education: N/A   Occupational History  . Not on file.   Social History Main Topics  . Smoking status: Never Smoker  . Smokeless tobacco: Never Used  . Alcohol use No  . Drug use: No  . Sexual activity: No   Other Topics Concern  . Not on file   Social History Narrative   Moved here from [place] in October 2014.     No Known Allergies   Outpatient Medications Prior to Visit  Medication Sig Dispense Refill  . acetaminophen (TYLENOL 8 HOUR) 650 MG CR tablet Take 1 tablet (650 mg total) by mouth every 8 (eight) hours as needed for pain. 90 tablet 3  . amLODipine (NORVASC) 5 MG tablet Take 1 tablet (5 mg total) by mouth daily. 30 tablet 1  . aspirin 81 MG EC tablet Take 1 tablet (81 mg total) by mouth daily. 30 tablet 5  . carvedilol (COREG) 6.25 MG tablet TAKE 1 TABLET BY MOUTH 2 TIMES DAILY WITH A MEAL. 60 tablet 2  . diphenhydramine-acetaminophen (TYLENOL PM) 25-500 MG TABS Take 1 tablet by mouth at bedtime as needed (for  sleep).    Marland Kitchen guaiFENesin (MUCINEX) 600 MG 12 hr tablet Take 1 tablet (600 mg total) by mouth 2 (two) times daily. 20 tablet 0  . hydrochlorothiazide (MICROZIDE) 12.5 MG capsule TAKE 1 CAPSULE BY MOUTH DAILY. 30 capsule 2  . levofloxacin (LEVAQUIN) 500 MG tablet Take 1 tablet (500 mg total) by mouth daily. 5 tablet 0  . losartan (COZAAR) 50 MG tablet TAKE 1 TABLET BY MOUTH DAILY. 30 tablet 2  . pantoprazole (PROTONIX) 40 MG tablet TAKE 1 TABLET BY MOUTH DAILY. 30 tablet 2  . Polyethyl Glycol-Propyl Glycol (SYSTANE) 0.4-0.3 % SOLN Apply 2 drops to eye as needed. 10 mL 0  . sodium chloride (OCEAN) 0.65 % SOLN nasal spray Place 1 spray into both nostrils as needed (dry nose). 30 mL 0   . Biotin 1000 MCG tablet Take 1 tablet (1 mg total) by mouth daily. (Patient not taking: Reported on 04/22/2016) 30 tablet 11   No facility-administered medications prior to visit.       Review of Systems  Constitutional: Positive for fatigue. Negative for appetite change, chills, diaphoresis, fever and malaise/fatigue.  HENT: Positive for trouble swallowing. Negative for hoarse voice, postnasal drip, rhinorrhea, sneezing and sore throat.   Respiratory: Positive for cough, shortness of breath and wheezing. Negative for hemoptysis and sputum production.   Cardiovascular: Positive for chest pain. Negative for dyspnea on exertion and PND.  Gastrointestinal: Negative.   Musculoskeletal: Negative for myalgias.       Objective:   Physical Exam Vitals:   04/22/16 0949  BP: 133/81  Pulse: 78  Resp: 18  Temp: 98.5 F (36.9 C)  TempSrc: Oral  SpO2: 96%  Weight: 181 lb 3.2 oz (82.2 kg)  Height: 5' 2"  (1.575 m)    Gen: Pleasant, well-nourished, in no distress,  normal affect  ENT: No lesions,  mouth clear,  oropharynx clear, no postnasal drip  Neck: No JVD, no TMG, no carotid bruits  Lungs: No use of accessory muscles, no dullness to percussion, clear without rales or rhonchi  Cardiovascular: RRR, heart sounds normal, no murmur or gallops, no peripheral edema  Abdomen: soft and NT, no HSM,  BS normal  Musculoskeletal: No deformities, no cyanosis or clubbing  Neuro: alert, non focal  Skin: Warm, no lesions or rashes  No results found.  CXR 9/14: Right middle lobe atelectasis or early pneumonia. No CHF. Followup PA and lateral chest X-ray is recommended in 3-4 weeks following trial of antibiotic therapy to ensure resolution and exclude underlying malignancy.        Assessment & Plan:  I personally reviewed all images and lab data in the [facility] system as well as any outside material available during this office visit and agree with the  radiology impressions.    Pneumonia involving right lung CAP RLL Improved  plan Complete current abx course F/u cxr 6 weeks with ov  OSA (obstructive sleep apnea) Stable osa off cpap  Diastolic CHF ([place]) compensated   Heather [name] was seen today for pneumonia.  Diagnoses and all orders for this visit:  Community acquired pneumonia of right upper lobe of lung ([place]) -     DG Chest 2 View; Future  Pneumonia of right lower lobe due to infectious organism ([place])  OSA (obstructive sleep apnea)  Chronic diastolic congestive heart failure ([place])

## 2016-04-23 ENCOUNTER — Inpatient Hospital Stay: Payer: BLUE CROSS/BLUE SHIELD

## 2016-04-23 NOTE — Assessment & Plan Note (Signed)
compensated

## 2016-04-23 NOTE — Assessment & Plan Note (Signed)
CAP RLL Improved  plan Complete current abx course F/u cxr 6 weeks with ov

## 2016-04-23 NOTE — Assessment & Plan Note (Signed)
Stable osa off cpap

## 2016-04-27 DIAGNOSIS — M3500 Sicca syndrome, unspecified: Secondary | ICD-10-CM | POA: Insufficient documentation

## 2016-04-27 DIAGNOSIS — K117 Disturbances of salivary secretion: Secondary | ICD-10-CM | POA: Insufficient documentation

## 2016-04-27 DIAGNOSIS — R682 Dry mouth, unspecified: Secondary | ICD-10-CM

## 2016-05-06 ENCOUNTER — Encounter: Payer: Self-pay | Admitting: Family Medicine

## 2016-05-06 ENCOUNTER — Ambulatory Visit: Payer: BLUE CROSS/BLUE SHIELD | Attending: Family Medicine | Admitting: Family Medicine

## 2016-05-06 VITALS — BP 118/75 | HR 78 | Temp 98.7°F | Ht 62.0 in | Wt 179.8 lb

## 2016-05-06 DIAGNOSIS — Z23 Encounter for immunization: Secondary | ICD-10-CM | POA: Diagnosis not present

## 2016-05-06 DIAGNOSIS — I272 Pulmonary hypertension, unspecified: Secondary | ICD-10-CM | POA: Insufficient documentation

## 2016-05-06 DIAGNOSIS — M25552 Pain in left hip: Secondary | ICD-10-CM | POA: Diagnosis not present

## 2016-05-06 DIAGNOSIS — R011 Cardiac murmur, unspecified: Secondary | ICD-10-CM | POA: Diagnosis not present

## 2016-05-06 DIAGNOSIS — J181 Lobar pneumonia, unspecified organism: Secondary | ICD-10-CM | POA: Diagnosis not present

## 2016-05-06 DIAGNOSIS — G8929 Other chronic pain: Secondary | ICD-10-CM | POA: Diagnosis present

## 2016-05-06 DIAGNOSIS — K5090 Crohn's disease, unspecified, without complications: Secondary | ICD-10-CM | POA: Insufficient documentation

## 2016-05-06 DIAGNOSIS — J189 Pneumonia, unspecified organism: Secondary | ICD-10-CM | POA: Insufficient documentation

## 2016-05-06 DIAGNOSIS — E1122 Type 2 diabetes mellitus with diabetic chronic kidney disease: Secondary | ICD-10-CM | POA: Diagnosis not present

## 2016-05-06 DIAGNOSIS — I509 Heart failure, unspecified: Secondary | ICD-10-CM | POA: Diagnosis not present

## 2016-05-06 DIAGNOSIS — Z7982 Long term (current) use of aspirin: Secondary | ICD-10-CM | POA: Insufficient documentation

## 2016-05-06 DIAGNOSIS — N184 Chronic kidney disease, stage 4 (severe): Secondary | ICD-10-CM

## 2016-05-06 DIAGNOSIS — M545 Low back pain, unspecified: Secondary | ICD-10-CM

## 2016-05-06 DIAGNOSIS — G4733 Obstructive sleep apnea (adult) (pediatric): Secondary | ICD-10-CM | POA: Diagnosis not present

## 2016-05-06 DIAGNOSIS — K219 Gastro-esophageal reflux disease without esophagitis: Secondary | ICD-10-CM | POA: Insufficient documentation

## 2016-05-06 DIAGNOSIS — M25551 Pain in right hip: Secondary | ICD-10-CM | POA: Insufficient documentation

## 2016-05-06 DIAGNOSIS — M25511 Pain in right shoulder: Secondary | ICD-10-CM

## 2016-05-06 DIAGNOSIS — I130 Hypertensive heart and chronic kidney disease with heart failure and stage 1 through stage 4 chronic kidney disease, or unspecified chronic kidney disease: Secondary | ICD-10-CM | POA: Diagnosis not present

## 2016-05-06 DIAGNOSIS — Z85828 Personal history of other malignant neoplasm of skin: Secondary | ICD-10-CM | POA: Insufficient documentation

## 2016-05-06 DIAGNOSIS — Z950 Presence of cardiac pacemaker: Secondary | ICD-10-CM | POA: Insufficient documentation

## 2016-05-06 DIAGNOSIS — M48062 Spinal stenosis, lumbar region with neurogenic claudication: Secondary | ICD-10-CM | POA: Insufficient documentation

## 2016-05-06 DIAGNOSIS — G43909 Migraine, unspecified, not intractable, without status migrainosus: Secondary | ICD-10-CM | POA: Insufficient documentation

## 2016-05-06 MED ORDER — DICLOFENAC SODIUM 1 % TD GEL
4.0000 g | Freq: Four times a day (QID) | TRANSDERMAL | 5 refills | Status: DC
Start: 2016-05-06 — End: 2016-05-27

## 2016-05-06 MED ORDER — ACETAMINOPHEN-CODEINE #3 300-30 MG PO TABS: 1.0000 | ORAL_TABLET | Freq: Three times a day (TID) | ORAL | 0 refills | Status: DC | PRN

## 2016-05-06 NOTE — Progress Notes (Signed)
Subjective:  Patient ID: Heather [name], female    DOB: 23-Jan-1941  Age: 75 y.o. MRN: [id number]  CC: Hospitalization Follow-up (pneumonia) and Joint Pain   HPI Skye Plamondon has CAD, CKD stage 4, diastolic CHF, s/p pacemaker placement for sinus bradycardia (HR sustained between 37-40 for two weeks 7/13-7/27/15), HTN, obesity she presents for   1.  HFU RML pneumonia: she is almost 5 weeks out from Right middle lobe pneumonia. She no longer has cough, CP or SOB. Denies fever. She has completed all antibiotics. She has already been seen once in office for hospital follow up, f/u CXR was ordered at this time. The   2. Pain in upper back, low back and hips:  These are chronic pains. They have worsened in the past few months since she stopped going to the [facility] because her daughter had knee replacement surgery. She has pain and soreness in her posterior neck, upper back, R shoulder, low back and hips. She is taking tylenol intermittently for pain.   Past Medical History:  Diagnosis Date  . CHF (congestive heart failure) ([place])   . Chronic back pain   . Crohn's disease ([place])   . GERD (gastroesophageal reflux disease)   . High cholesterol   . History of echocardiogram    Echo 12/16: EF 60-65%, no RWMA, Gr 2 DD, mild AS (mean 13 mmHg), mild AI, MAC, trivial MS, mild MR, mild LAE, mild RVE, mild to mod TR, PASP 36 mmHg  . Hypertension   . Migraine    "1-2 times/yr" (02/16/2014)  . Murmur, cardiac   . OSA on CPAP   . 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 Pulmonary hypertension   . Sinus bradycardia 01/29/2014  . Skin cancer 2003   "3 cut off face" (02/16/2014)  . Type II diabetes mellitus ([place])    Past Surgical History:  Procedure Laterality Date  . CARDIAC CATHETERIZATION  1995; 2005  . CATARACT EXTRACTION Bilateral 1982  . INSERT / REPLACE / REMOVE PACEMAKER  02/14/2014  . PERMANENT PACEMAKER INSERTION N/A 02/14/2014   Procedure: PERMANENT PACEMAKER INSERTION;  Surgeon: Deboraha Sprang, MD;   Location[facility] CATH LAB;  Service: Cardiovascular;  Laterality: N/A;  . SKIN CANCER EXCISION  2003    "face X 3" (02/16/2014)  . TEMPORARY PACEMAKER INSERTION N/A 02/12/2014   Procedure: TEMPORARY PACEMAKER INSERTION;  Surgeon: Lorretta Harp, MD;  Location: [facility] CATH LAB;  Service: Cardiovascular;  Laterality: N/A;  . VAGINAL HYSTERECTOMY  1972   Social History  Substance Use Topics  . Smoking status: Never Smoker  . Smokeless tobacco: Never Used  . Alcohol use No    Outpatient Medications Prior to Visit  Medication Sig Dispense Refill  . acetaminophen (TYLENOL 8 HOUR) 650 MG CR tablet Take 1 tablet (650 mg total) by mouth every 8 (eight) hours as needed for pain. 90 tablet 3  . amLODipine (NORVASC) 5 MG tablet Take 1 tablet (5 mg total) by mouth daily. 30 tablet 1  . aspirin 81 MG EC tablet Take 1 tablet (81 mg total) by mouth daily. 30 tablet 5  . carvedilol (COREG) 6.25 MG tablet TAKE 1 TABLET BY MOUTH 2 TIMES DAILY WITH A MEAL. 60 tablet 2  . diphenhydramine-acetaminophen (TYLENOL PM) 25-500 MG TABS Take 1 tablet by mouth at bedtime as needed (for sleep).    Marland Kitchen guaiFENesin (MUCINEX) 600 MG 12 hr tablet Take 1 tablet (600 mg total) by mouth 2 (two) times daily. 20 tablet 0  . hydrochlorothiazide (  MICROZIDE) 12.5 MG capsule TAKE 1 CAPSULE BY MOUTH DAILY. 30 capsule 2  . levofloxacin (LEVAQUIN) 500 MG tablet Take 1 tablet (500 mg total) by mouth daily. 5 tablet 0  . losartan (COZAAR) 50 MG tablet TAKE 1 TABLET BY MOUTH DAILY. 30 tablet 2  . pantoprazole (PROTONIX) 40 MG tablet TAKE 1 TABLET BY MOUTH DAILY. 30 tablet 2  . Polyethyl Glycol-Propyl Glycol (SYSTANE) 0.4-0.3 % SOLN Apply 2 drops to eye as needed. 10 mL 0  . sodium chloride (OCEAN) 0.65 % SOLN nasal spray Place 1 spray into both nostrils as needed (dry nose). 30 mL 0  . Biotin 1000 MCG tablet Take 1 tablet (1 mg total) by mouth daily. (Patient not taking: Reported on 05/06/2016) 30 tablet 11   No facility-administered  medications prior to visit.     ROS Review of Systems  Constitutional: Negative for chills and fever.  Eyes: Negative for visual disturbance.  Respiratory: Negative for cough and shortness of breath.   Cardiovascular: Negative for chest pain.  Gastrointestinal: Negative for abdominal pain and blood in stool.  Musculoskeletal: Positive for arthralgias, back pain, myalgias and neck pain.  Skin: Negative for rash.  Allergic/Immunologic: Negative for immunocompromised state.  Hematological: Negative for adenopathy. Does not bruise/bleed easily.  Psychiatric/Behavioral: Negative for dysphoric mood and suicidal ideas.    Objective:  BP 118/75 (BP Location: Right Arm, Patient Position: Sitting, Cuff Size: Small)   Pulse 78   Temp 98.7 F (37.1 C) (Oral)   Ht 5' 2"  (1.575 m)   Wt 179 lb 12.8 oz (81.6 kg)   SpO2 96%   BMI 32.89 kg/m   BP/Weight 05/06/2016 04/22/2016 78/08/4233  Systolic BP 361 443 154  Diastolic BP 75 81 75  Wt. (Lbs) 179.8 181.2 -  BMI 32.89 33.14 -   Wt Readings from Last 3 Encounters:  05/06/16 179 lb 12.8 oz (81.6 kg)  04/22/16 181 lb 3.2 oz (82.2 kg)  04/19/16 185 lb 3 oz (84 kg)   Physical Exam  Constitutional: She is oriented to person, place, and time. She appears well-developed and well-nourished. No distress.  HENT:  Head: Normocephalic and atraumatic.  Cardiovascular: Normal rate, regular rhythm, normal heart sounds and intact distal pulses.   Pulmonary/Chest: Effort normal and breath sounds normal.  Musculoskeletal: She exhibits no edema.       Right shoulder: She exhibits decreased range of motion, tenderness and pain. She exhibits no bony tenderness, no swelling, no effusion, no crepitus, no deformity, no laceration, no spasm, normal pulse and normal strength.  Back Exam: Back: Normal Curvature, no deformities or CVA tenderness  Paraspinal Tenderness: b/l lumbar   LE Strength 5/5 on L, 4+/5 on R  LE Sensation: in tact  LE Reflexes 2+ and  symmetric  Straight leg raise: positive on R side    Neurological: She is alert and oriented to person, place, and time.  Skin: Skin is warm and dry. No rash noted.  Psychiatric: She has a normal mood and affect.     Assessment & Plan:  Arnie was seen today for hospitalization follow-up and joint pain.  Diagnoses and all orders for this visit:  Pneumonia of right lower lobe due to infectious organism [facility])  Chronic bilateral low back pain without sciatica -     Ambulatory referral to Orthopedic Surgery -     diclofenac sodium (VOLTAREN) 1 % GEL; Apply 4 g topically 4 (four) times daily. -     acetaminophen-codeine (TYLENOL #3) 300-30 MG tablet;  Take 1 tablet by mouth every 8 (eight) hours as needed for moderate pain. -     DG Lumbar Spine 2-3 Views; Future  Chronic right shoulder pain -     Ambulatory referral to Orthopedic Surgery -     diclofenac sodium (VOLTAREN) 1 % GEL; Apply 4 g topically 4 (four) times daily. -     acetaminophen-codeine (TYLENOL #3) 300-30 MG tablet; Take 1 tablet by mouth every 8 (eight) hours as needed for moderate pain.  Bilateral hip pain -     Ambulatory referral to Orthopedic Surgery -     diclofenac sodium (VOLTAREN) 1 % GEL; Apply 4 g topically 4 (four) times daily. -     acetaminophen-codeine (TYLENOL #3) 300-30 MG tablet; Take 1 tablet by mouth every 8 (eight) hours as needed for moderate pain. -     DG HIP UNILAT WITH PELVIS 2-3 VIEWS LEFT; Future  Other orders -     Flu Vaccine QUAD 36+ mos IM   There are no diagnoses linked to this encounter.  No orders of the defined types were placed in this encounter.   Follow-up: Return in about 6 weeks (around 06/17/2016) for joint pains .   Boykin Nearing MD

## 2016-05-06 NOTE — Progress Notes (Signed)
Pt is getting flu shot today.

## 2016-05-06 NOTE — Patient Instructions (Addendum)
Heather [name] was seen today for hospitalization follow-up.  Diagnoses and all orders for this visit:  Pneumonia of right lower lobe due to infectious organism [facility])  Chronic bilateral low back pain without sciatica -     Ambulatory referral to Orthopedic Surgery -     diclofenac sodium (VOLTAREN) 1 % GEL; Apply 4 g topically 4 (four) times daily. -     acetaminophen-codeine (TYLENOL #3) 300-30 MG tablet; Take 1 tablet by mouth every 8 (eight) hours as needed for moderate pain. -     DG Lumbar Spine 2-3 Views; Future  Chronic right shoulder pain -     Ambulatory referral to Orthopedic Surgery -     diclofenac sodium (VOLTAREN) 1 % GEL; Apply 4 g topically 4 (four) times daily. -     acetaminophen-codeine (TYLENOL #3) 300-30 MG tablet; Take 1 tablet by mouth every 8 (eight) hours as needed for moderate pain.  Bilateral hip pain -     Ambulatory referral to Orthopedic Surgery -     diclofenac sodium (VOLTAREN) 1 % GEL; Apply 4 g topically 4 (four) times daily. -     acetaminophen-codeine (TYLENOL #3) 300-30 MG tablet; Take 1 tablet by mouth every 8 (eight) hours as needed for moderate pain. -     DG HIP UNILAT WITH PELVIS 2-3 VIEWS LEFT; Future  Other orders -     Flu Vaccine QUAD 36+ mos IM   repeat chest x-ray between 10/19-11/08/2015.   Return to exercise to reduce joint pains  F/u in 6 weeks for joint pains   Dr. Adrian Blackwater

## 2016-05-07 NOTE — Assessment & Plan Note (Signed)
Chronic pain in setting of decreased exercise Known mild arthritis noted on R hip xray obtained following fall  P: Pain control with tylenol scheduled Tylenol #3 prn, to be used sparingly Return to exercise Ortho referral placed

## 2016-05-07 NOTE — Assessment & Plan Note (Signed)
Treated Clinically improved  F/u CXR to be done within next 2 weeks, already ordered

## 2016-05-07 NOTE — Assessment & Plan Note (Addendum)
Chronic pain worsened in setting of decreased exercise  Plan: X-ray imaging Pain control with tylenol scheduled Tylenol #3 prn, to be used sparingly Return to exercise Ortho referral placed

## 2016-05-07 NOTE — Assessment & Plan Note (Signed)
Chronic pain worsened since she stopped working out with decreased ROM in R shoulder  Plan: Pain control with tylenol scheduled Tylenol #3 prn, to be used sparingly Return to exercise Ortho referral placed for evaluation and possible joint injection

## 2016-05-07 NOTE — Assessment & Plan Note (Signed)
Stable CKD With well controlled BP  Continue current treatment Continue to avoid NSAIDs and promote hydration

## 2016-05-20 ENCOUNTER — Ambulatory Visit (HOSPITAL_COMMUNITY)
Admission: RE | Admit: 2016-05-20 | Discharge: 2016-05-20 | Disposition: A | Discharge status: Home / Self Care | Payer: BLUE CROSS/BLUE SHIELD | Source: Ambulatory Visit | Attending: Family Medicine | Admitting: Family Medicine

## 2016-05-20 ENCOUNTER — Ambulatory Visit (HOSPITAL_COMMUNITY)
Admission: RE | Admit: 2016-05-20 | Discharge: 2016-05-20 | Disposition: A | Discharge status: Home / Self Care | Payer: BLUE CROSS/BLUE SHIELD | Source: Ambulatory Visit | Attending: Critical Care Medicine | Admitting: Critical Care Medicine

## 2016-05-20 DIAGNOSIS — M545 Low back pain, unspecified: Secondary | ICD-10-CM

## 2016-05-20 DIAGNOSIS — G8929 Other chronic pain: Secondary | ICD-10-CM | POA: Insufficient documentation

## 2016-05-20 DIAGNOSIS — M25551 Pain in right hip: Secondary | ICD-10-CM | POA: Insufficient documentation

## 2016-05-20 DIAGNOSIS — M47896 Other spondylosis, lumbar region: Secondary | ICD-10-CM | POA: Insufficient documentation

## 2016-05-20 DIAGNOSIS — J181 Lobar pneumonia, unspecified organism: Secondary | ICD-10-CM | POA: Insufficient documentation

## 2016-05-20 DIAGNOSIS — J189 Pneumonia, unspecified organism: Secondary | ICD-10-CM

## 2016-05-20 DIAGNOSIS — M25552 Pain in left hip: Secondary | ICD-10-CM | POA: Insufficient documentation

## 2016-05-22 ENCOUNTER — Telehealth: Payer: Self-pay

## 2016-05-22 NOTE — Telephone Encounter (Signed)
Pt was called on 11/03 and a VM was left informing pt of lab results.

## 2016-05-27 ENCOUNTER — Encounter: Payer: Self-pay | Admitting: Critical Care Medicine

## 2016-05-27 ENCOUNTER — Ambulatory Visit: Payer: BLUE CROSS/BLUE SHIELD | Attending: Critical Care Medicine | Admitting: Critical Care Medicine

## 2016-05-27 VITALS — BP 108/70 | HR 74 | Temp 98.4°F | Resp 18 | Ht 62.0 in | Wt 181.0 lb

## 2016-05-27 DIAGNOSIS — I350 Nonrheumatic aortic (valve) stenosis: Secondary | ICD-10-CM | POA: Insufficient documentation

## 2016-05-27 DIAGNOSIS — R079 Chest pain, unspecified: Secondary | ICD-10-CM | POA: Insufficient documentation

## 2016-05-27 DIAGNOSIS — Z23 Encounter for immunization: Secondary | ICD-10-CM

## 2016-05-27 DIAGNOSIS — J189 Pneumonia, unspecified organism: Secondary | ICD-10-CM

## 2016-05-27 DIAGNOSIS — R05 Cough: Secondary | ICD-10-CM | POA: Insufficient documentation

## 2016-05-27 DIAGNOSIS — R5383 Other fatigue: Secondary | ICD-10-CM | POA: Diagnosis not present

## 2016-05-27 DIAGNOSIS — J181 Lobar pneumonia, unspecified organism: Secondary | ICD-10-CM

## 2016-05-27 NOTE — Progress Notes (Signed)
   Subjective:    Patient ID: Heather [name], female    DOB: 10-12-1940, 75 y.o.   MRN: [id number]  Followup pneumonia  Adm 9/28 - 10/3.  Hospital Course: 75 year old female had cough, chest pain and right lung consolidation on X-ray. She responded to IV antibiotic and oral zithromax. She needed flutter valve to beak up the mucus. Her echocardiogram showed mild LV hypertrophy with mild Aortic valve stenosis.    Consults: None    Significant Diagnostic Studies: labs: Normal CBC. Normal BMET except BUN/Cr of 34/1.90.   Chest x-ray: Right lung atelectasis v/s pneumonia.  Echocardiogram: Mild LVH, normal systolic function, mild diastolic dysfunction. Mild MR and mild AS.  Here for f/u 05/27/2016      Pneumonia  She complains of chest tightness, cough and shortness of breath. There is no difficulty breathing, frequent throat clearing, hemoptysis, hoarse voice, sputum production or wheezing. This is a new problem. The current episode started in the past 7 days. The problem occurs intermittently. The problem has been rapidly improving. The cough is non-productive and dry. Pertinent negatives include no appetite change, chest pain, dyspnea on exertion, fever, malaise/fatigue, myalgias, orthopnea, PND, postnasal drip, rhinorrhea, sneezing, sore throat or trouble swallowing.    Review of Systems  Constitutional: Positive for fatigue. Negative for appetite change, chills, diaphoresis, fever and malaise/fatigue.  HENT: Negative for hoarse voice, postnasal drip, rhinorrhea, sneezing, sore throat and trouble swallowing.   Respiratory: Positive for cough and shortness of breath. Negative for hemoptysis, sputum production and wheezing.        Cough is different and clearer  Cardiovascular: Negative for chest pain, dyspnea on exertion and PND.  Gastrointestinal: Negative.   Musculoskeletal: Negative for myalgias.       Objective:   Physical Exam  Vitals:   05/27/16 1108  BP: 108/70    Pulse: 74  Resp: 18  Temp: 98.4 F (36.9 C)  TempSrc: Oral  SpO2: 97%  Weight: 181 lb (82.1 kg)  Height: 5' 2"  (1.575 m)    Gen: Pleasant, well-nourished, in no distress,  normal affect  ENT: No lesions,  mouth clear,  oropharynx clear, no postnasal drip  Neck: No JVD, no TMG, no carotid bruits  Lungs: No use of accessory muscles, no dullness to percussion, clear without rales or rhonchi  Cardiovascular: RRR, heart sounds normal, no murmur or gallops, no peripheral edema  Abdomen: soft and NT, no HSM,  BS normal  Musculoskeletal: No deformities, no cyanosis or clubbing  Neuro: alert, non focal  Skin: Warm, no lesions or rashes  No results found.  CXR 11/1: FINDINGS: Cardiac shadow is within normal limits. There is been interval clearing of the right middle lobe when compared with the prior exam. No focal infiltrate or sizable effusion is seen. A pacing device is again noted.  IMPRESSION: No acute abnormality seen      Assessment & Plan:  I personally reviewed all images and lab data in the [facility] system as well as any outside material available during this office visit and agree with the  radiology impressions.   No problem-specific Assessment & Plan notes found for this encounter.   There are no diagnoses linked to this encounter.

## 2016-05-27 NOTE — Progress Notes (Signed)
Patient is here for FU  Patient denies pain at this time.  Patient has taken medication today. Patient has eaten today.  Patient states Voltaren is not covered by the insurance. Possible alternative being requested.  Patient tolerated pneumoccal vaccine well today.

## 2016-05-27 NOTE — Patient Instructions (Signed)
No more antibiotics needed A pneumonia vaccine was given Return as needed Keep follow up appointments with your primary care MD

## 2016-05-27 NOTE — Assessment & Plan Note (Signed)
CAP resolved cxr clear  no further ABX Pneumovax today Return prn

## 2016-06-02 ENCOUNTER — Ambulatory Visit (INDEPENDENT_AMBULATORY_CARE_PROVIDER_SITE_OTHER): Payer: BLUE CROSS/BLUE SHIELD | Admitting: Orthopaedic Surgery

## 2016-06-02 ENCOUNTER — Encounter (INDEPENDENT_AMBULATORY_CARE_PROVIDER_SITE_OTHER): Payer: Self-pay | Admitting: Orthopaedic Surgery

## 2016-06-02 VITALS — BP 130/73 | HR 83 | Ht 62.0 in | Wt 181.0 lb

## 2016-06-02 DIAGNOSIS — G8929 Other chronic pain: Secondary | ICD-10-CM | POA: Diagnosis not present

## 2016-06-02 DIAGNOSIS — M419 Scoliosis, unspecified: Secondary | ICD-10-CM

## 2016-06-02 DIAGNOSIS — M75121 Complete rotator cuff tear or rupture of right shoulder, not specified as traumatic: Secondary | ICD-10-CM

## 2016-06-02 DIAGNOSIS — M545 Low back pain, unspecified: Secondary | ICD-10-CM

## 2016-06-02 DIAGNOSIS — I5032 Chronic diastolic (congestive) heart failure: Secondary | ICD-10-CM

## 2016-06-02 HISTORY — DX: Complete rotator cuff tear or rupture of right shoulder, not specified as traumatic: M75.121

## 2016-06-02 NOTE — Progress Notes (Signed)
Office Visit Note   Patient: Heather [name]           Date of Birth: 1940/12/03           MRN: [id number] Visit Date: 06/02/2016              Requested by: Boykin Nearing, MD [address] [place], [place] [address] PCP: Minerva Ends, MD   Assessment & Plan: Visit Diagnoses:  1. Scoliosis of lumbar spine, unspecified scoliosis type   2. Chronic diastolic congestive heart failure ([place])   3. Chronic bilateral low back pain without sciatica   4. 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 Daughter added that she did much better when she was going back to the [facility] and doing exercises. She has used some Tylenol No. 3 which seems to help. Patient has a pacemaker which prevents an MRI. Her multilevel lumbar changes would suggest with the scoliosis and listhesis she would need a multilevel instrumented procedure in order to correct the problem she is having which would be certainly at increased risk with her chronic heart failure. I recommend she resume workout activity work on weight loss and stay with her current medications. 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 I reviewed the patient's plain radiographs over the PACS system lumbar spine which shows multilevels of listhesis significant disc collapse at L4-5 disc space narrowing from L2-S1 no pars defects. Large lateral osteophytes on the endplates from the scoliosis. Significant facet degenerative changes. X-rays of her shoulder seen on chest x-ray from 05/20/2016 showed some osteoarthritic changes in the shoulder and some before meals degenerative changes without high riding head. Follow-Up Instructions: Return if symptoms worsen or fail to  improve.   Orders:  No orders of the defined types were placed in this encounter.  No orders of the defined types were placed in this encounter.     Procedures: No procedures performed   Clinical Data: No additional findings.   Subjective: Chief Complaint  Patient presents with  . Lower Back - Pain  . Left Hip - Pain    Patient is here today with complaint of low back and right hip pain. She states there is no known injury, and that she has been hurting for over a year. The pain is getting worse. Pain radiates all of the way down to her toe. She states she does experience some pain in the left leg, but the right hip is much worse.  She is taking Tylenol #3 as needed for pain with relief. Patient has had xrays made.    Review of Systems  Constitutional: Negative for chills and diaphoresis.  HENT: Negative for ear discharge, ear pain and nosebleeds.   Eyes: Negative for discharge and visual disturbance.  Respiratory: Negative for cough, choking and shortness of breath.   Cardiovascular: Negative for chest pain and palpitations.       History of congestive heart failure she has the implantable pacemaker.  Gastrointestinal: Negative for abdominal distention and abdominal pain.  Endocrine: Negative for cold intolerance and heat intolerance.  Genitourinary: Negative for flank pain and hematuria.  Musculoskeletal:       Chronic right shoulder pain for years she is unable reach arm  up overhead without significant pain. Chronic low back pain for more than 2 years.  Skin: Negative for rash and wound.  Allergic/Immunologic: Negative for immunocompromised state.  Neurological: Negative for seizures and speech difficulty.  Hematological: Negative for adenopathy. Does not bruise/bleed easily.  Psychiatric/Behavioral: Negative for agitation and suicidal ideas.     Objective: Vital Signs: BP 130/73   Pulse 83   Ht 5' 2"  (1.575 m)   Wt 181 lb (82.1 kg)   BMI 33.11 kg/m    Physical Exam  Constitutional: She is oriented to person, place, and time. She appears well-developed.  HENT:  Head: Normocephalic.  Right Ear: External ear normal.  Left Ear: External ear normal.  Eyes: Pupils are equal, round, and reactive to light.  Neck: No tracheal deviation present. No thyromegaly present.  No JVD.  Cardiovascular: Normal rate.   Patient has a pacemaker left upper chest wall.  Pulmonary/Chest: Effort normal. No respiratory distress. She has no wheezes.  Abdominal: Soft.  Musculoskeletal:  Patient has scoliosis mild pelvic obliquity. Bilateral sciatic notch tenderness. She is able to heel toe gait with slight right leg limp. No pitting edema posterior tibialis pulses palpable bilaterally. Anterior tib and gastrocsoleus are strong. She has pain with straight leg raising on the right. Popliteal compression test. Bilateral sciatic notch tenderness. negative left straight leg raise. Straight leg raise on the right is at 70. Normal hip range of motion right left 40 internal and external rotation without pain bilateral trochanteric bursal tenderness. No hip flexion weakness. Upper extremities wrist finger show no deformity. Extension normal grip. Right shoulder positive drop arm test positive impingement negative Neer negative distal biceps migration. Elbow reaches full extension.  Neurological: She is alert and oriented to person, place, and time.  Skin: Skin is warm and dry.  Psychiatric: She has a normal mood and affect. Her behavior is normal.    Ortho Exam see above under musculoskeletal. Findings consistent with right rotator cuff tear likely old. Left shoulder rotator cuff testing is normal with good range of motion negative drop arm test no impingement.  Specialty Comments:  No specialty comments available.  Imaging: No results found.   PMFS History: Patient Active Problem List   Diagnosis Date Noted  . Scoliosis of lumbar spine 06/02/2016  . Complete  tear of right rotator cuff 06/02/2016  . Low back pain 05/06/2016  . Right shoulder pain 05/06/2016  . Loss of hair 11/21/2015  . Postmenopausal atrophic vaginitis 09/16/2015  . CKD (chronic kidney disease) stage 4, GFR 15-29 ml/min (HCC) 05/06/2015  . Bilateral hip pain 05/06/2015  . Seborrheic keratoses 05/06/2015  . 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 Crohn's disease ([place]) 02/16/2014  . Prediabetes 01/29/2014  . OSA (obstructive sleep apnea) 01/29/2014  . Pulmonary hypertension 01/29/2014  . CAD (coronary artery disease) 01/29/2014  . Hypertension 01/29/2014  . Diastolic CHF ([place]) 07/28/3233   Past Medical History:  Diagnosis Date  . CHF (congestive heart failure) ([place])   . Chronic back pain   . Crohn's disease ([place])   . GERD (gastroesophageal reflux disease)   . High cholesterol   . History of echocardiogram    Echo 12/16: EF 60-65%, no RWMA, Gr 2 DD, mild AS (mean 13 mmHg), mild AI, MAC, trivial MS, mild MR, mild LAE, mild RVE, mild to mod TR, PASP 36 mmHg  . Hypertension   . Migraine    "1-2 times/yr" (02/16/2014)  . Murmur, cardiac   . OSA on CPAP   . 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  Pulmonary hypertension   . Sinus bradycardia 01/29/2014  . Skin cancer 2003   "3 cut off face" (02/16/2014)  . Type II diabetes mellitus (HCC)     Family History  Problem Relation Age of Onset  . Adopted: Yes  . Hypertension Mother   . Heart disease Mother     Past Surgical History:  Procedure Laterality Date  . CARDIAC CATHETERIZATION  1995; 2005  . CATARACT EXTRACTION Bilateral 1982  . INSERT / REPLACE / REMOVE PACEMAKER  02/14/2014  . PERMANENT PACEMAKER INSERTION N/A 02/14/2014   Procedure: PERMANENT PACEMAKER INSERTION;  Surgeon: Deboraha Sprang, MD;  [facility] LAB;  Service: Cardiovascular;  Laterality: N/A;  . SKIN CANCER EXCISION  2003    "face X 3" (02/16/2014)  . TEMPORARY PACEMAKER INSERTION N/A 02/12/2014   Procedure: TEMPORARY PACEMAKER INSERTION;  Surgeon: Lorretta Harp, MD;  Location: [facility] CATH LAB;  Service: Cardiovascular;  Laterality: N/A;  . VAGINAL HYSTERECTOMY  1972   Social History   Occupational History  . Not on file.   Social History Main Topics  . Smoking status: Never Smoker  . Smokeless tobacco: Never Used  . Alcohol use No  . Drug use: No  . Sexual activity: No

## 2016-06-15 ENCOUNTER — Ambulatory Visit (INDEPENDENT_AMBULATORY_CARE_PROVIDER_SITE_OTHER): Payer: BLUE CROSS/BLUE SHIELD | Admitting: Cardiology

## 2016-06-15 ENCOUNTER — Encounter: Payer: Self-pay | Admitting: Cardiology

## 2016-06-15 ENCOUNTER — Encounter: Payer: Self-pay | Admitting: *Deleted

## 2016-06-15 VITALS — BP 118/70 | HR 77 | Ht 62.0 in | Wt 181.0 lb

## 2016-06-15 DIAGNOSIS — Z950 Presence of cardiac pacemaker: Secondary | ICD-10-CM

## 2016-06-15 DIAGNOSIS — I495 Sick sinus syndrome: Secondary | ICD-10-CM | POA: Diagnosis not present

## 2016-06-15 DIAGNOSIS — N183 Chronic kidney disease, stage 3 unspecified: Secondary | ICD-10-CM

## 2016-06-15 DIAGNOSIS — I272 Pulmonary hypertension, unspecified: Secondary | ICD-10-CM

## 2016-06-15 DIAGNOSIS — I5032 Chronic diastolic (congestive) heart failure: Secondary | ICD-10-CM | POA: Diagnosis not present

## 2016-06-15 DIAGNOSIS — I110 Hypertensive heart disease with heart failure: Secondary | ICD-10-CM | POA: Diagnosis not present

## 2016-06-15 MED ORDER — LOSARTAN POTASSIUM 50 MG PO TABS: 50.0000 mg | ORAL_TABLET | Freq: Every day | ORAL | 3 refills | Status: DC

## 2016-06-15 MED ORDER — AMLODIPINE BESYLATE 5 MG PO TABS: 5.0000 mg | ORAL_TABLET | Freq: Every day | ORAL | 3 refills | Status: DC

## 2016-06-15 MED ORDER — HYDROCHLOROTHIAZIDE 12.5 MG PO CAPS: 12.5000 mg | ORAL_CAPSULE | Freq: Every day | ORAL | 3 refills | Status: DC

## 2016-06-15 MED ORDER — CARVEDILOL 6.25 MG PO TABS: ORAL_TABLET | ORAL | 3 refills | Status: DC

## 2016-06-15 NOTE — Progress Notes (Signed)
Cardiology Office Note    Date:  06/15/2016   ID:  Any Mcneice, [name] 1941/02/10, MRN [id number]  PCP:  Minerva Ends, MD  Cardiologist:   Candee Furbish, MD , Bernerd Pho M.D.    History of Present Illness:  Heather [name] is a 75 y.o. female former patient of Dr. Sherryl Barters here for follow-up.  She was discharged from the hospital on 04/21/16 with right lung pneumonia. Responded to IV antibiotics and Zithromax. Echocardiogram was performed showing mild left ventricular hypertrophy with mild aortic valve stenosis. Creatinine was 1.9. She was seen by Dr.Kadakia then.  She has a permanent pacemaker. Pacemaker was placed in July 2015. Has diastolic heart failure as well. Had hyperkalemia with spironolactone because of CKD. This was stopped.  She is going to be going back to [place] soon and having a dental implant. This is fine to proceed from a cardiac perspective. We have constructed a letter for her. She may hold her aspirin if necessary for 5-7 days.  She is not having any shortness of breath, no bleeding, no syncope, no chest pain. She has been feeling well. Her family member is present to assist with translation.    Past Medical History:  Diagnosis Date  . CHF (congestive heart failure) ([place])   . Chronic back pain   . Crohn's disease ([place])   . GERD (gastroesophageal reflux disease)   . High cholesterol   . History of echocardiogram    Echo 12/16: EF 60-65%, no RWMA, Gr 2 DD, mild AS (mean 13 mmHg), mild AI, MAC, trivial MS, mild MR, mild LAE, mild RVE, mild to mod TR, PASP 36 mmHg  . Hypertension   . Migraine    "1-2 times/yr" (02/16/2014)  . Murmur, cardiac   . OSA on CPAP   . 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 Pulmonary hypertension   . Sinus bradycardia 01/29/2014  . Skin cancer 2003   "3 cut off face" (02/16/2014)  . Type II diabetes mellitus ([place])     Past Surgical History:  Procedure Laterality Date  . CARDIAC CATHETERIZATION  1995; 2005  . CATARACT  EXTRACTION Bilateral 1982  . INSERT / REPLACE / REMOVE PACEMAKER  02/14/2014  . PERMANENT PACEMAKER INSERTION N/A 02/14/2014   Procedure: PERMANENT PACEMAKER INSERTION;  Surgeon: Deboraha Sprang, MD;  Location[facility] C[facility] LAB;  Service: Cardiovascular;  Laterality: N/A;  . SKIN CANCER EXCISION  2003    "face X 3" (02/16/2014)  . TEMPORARY PACEMAKER INSERTION N/A 02/12/2014   Procedure: TEMPORARY PACEMAKER INSERTION;  Surgeon: Lorretta Harp, MD;  Location: [facility] CATH LAB;  Service: Cardiovascular;  Laterality: N/A;  . VAGINAL HYSTERECTOMY  1972    Current Medications: Outpatient Medications Prior to Visit  Medication Sig Dispense Refill  . acetaminophen (TYLENOL 8 HOUR) 650 MG CR tablet Take 1 tablet (650 mg total) by mouth every 8 (eight) hours as needed for pain. 90 tablet 3  . acetaminophen-codeine (TYLENOL #3) 300-30 MG tablet Take 1 tablet by mouth every 8 (eight) hours as needed for moderate pain. 60 tablet 0  . aspirin 81 MG EC tablet Take 1 tablet (81 mg total) by mouth daily. 30 tablet 5  . diphenhydramine-acetaminophen (TYLENOL PM) 25-500 MG TABS Take 1 tablet by mouth at bedtime as needed (for sleep).    . pantoprazole (PROTONIX) 40 MG tablet TAKE 1 TABLET BY MOUTH DAILY. 30 tablet 2  . Polyethyl Glycol-Propyl Glycol (SYSTANE) 0.4-0.3 % SOLN Apply 2 drops to eye as needed. 10 mL  0  . sodium chloride (OCEAN) 0.65 % SOLN nasal spray Place 1 spray into both nostrils as needed (dry nose). 30 mL 0  . amLODipine (NORVASC) 5 MG tablet Take 1 tablet (5 mg total) by mouth daily. 30 tablet 1  . carvedilol (COREG) 6.25 MG tablet TAKE 1 TABLET BY MOUTH 2 TIMES DAILY WITH A MEAL. 60 tablet 2  . hydrochlorothiazide (MICROZIDE) 12.5 MG capsule TAKE 1 CAPSULE BY MOUTH DAILY. 30 capsule 2  . losartan (COZAAR) 50 MG tablet TAKE 1 TABLET BY MOUTH DAILY. 30 tablet 2   No facility-administered medications prior to visit.      Allergies:   Patient has no known allergies.   Social History   Social  History  . Marital status: Widowed    Spouse name: N/A  . Number of children: N/A  . Years of education: N/A   Social History Main Topics  . Smoking status: Never Smoker  . Smokeless tobacco: Never Used  . Alcohol use No  . Drug use: No  . Sexual activity: No   Other Topics Concern  . None   Social History Narrative   Moved here from [place] in October 2014.     Family History:  The patient's family history includes Heart disease in her mother; Hypertension in her mother. She was adopted.   ROS:   Please see the history of present illness.    ROS All other systems reviewed and are negative.   PHYSICAL EXAM:   VS:  BP 118/70   Pulse 77   Ht 5' 2"  (1.575 m)   Wt 181 lb (82.1 kg)   LMP  (LMP Unknown)   BMI 33.11 kg/m    GEN: Well nourished, well developed, in no acute distress  HEENT: normal  Neck: no JVD, carotid bruits, or masses Cardiac: RRR; no murmurs, rubs, or gallops,no edema  Respiratory:  clear to auscultation bilaterally, normal work of breathing GI: soft, nontender, nondistended, + BS MS: no deformity or atrophy  Skin: warm and dry, no rash Neuro:  Alert and Oriented x 3, Strength and sensation are intact Psych: euthymic mood, full affect  Wt Readings from Last 3 Encounters:  06/15/16 181 lb (82.1 kg)  06/02/16 181 lb (82.1 kg)  05/27/16 181 lb (82.1 kg)      Studies/Labs Reviewed:   EKG:  EKG is ordered today.  The ekg ordered today demonstrates Paced rhythm, atrial pacing, intrinsic ventricular contraction. Heart rate 77 bpm  Recent Labs: 07/09/2015: Brain Natriuretic Peptide 148.0 04/16/2016: ALT 21 04/17/2016: BUN 31; Creatinine, Ser 1.67; Hemoglobin 14.6; Platelets 234; Potassium 4.0; Sodium 138   Lipid Panel No results found for: CHOL, TRIG, HDL, CHOLHDL, VLDL, LDLCALC, LDLDIRECT  Additional studies/ records that were reviewed today include:  Hospital records, lab work, echocardiogram, EKG reviewed  ECHO 04/18/16 - Left ventricle: The  cavity size was normal. There was mild   concentric hypertrophy. Systolic function was normal. The   estimated ejection fraction was in the range of 55% to 60%. Wall   motion was normal; there were no regional wall motion   abnormalities. Doppler parameters are consistent with abnormal   left ventricular relaxation (grade 1 diastolic dysfunction). - Aortic valve: Valve mobility was restricted. There was mild   stenosis. There was mild regurgitation. Valve area (VTI): 1.39   cm^2. Valve area (Vmax): 1.33 cm^2. Valve area (Vmean): 1.26   cm^2. - Mitral valve: Moderately to severely calcified annulus. There was   mild regurgitation. - Left  atrium: The atrium was moderately dilated. - Pulmonary arteries: Systolic pressure was mildly increased. PA   peak pressure: 32 mm Hg (S).   ASSESSMENT:    1. Chronic diastolic heart failure (HCC)   2. Pulmonary hypertension   3. Pacemaker   4. CKD (chronic kidney disease) stage 3, GFR 30-59 ml/min   5. Sick sinus syndrome ([place])   6. Hypertensive heart disease with heart failure (HCC)      PLAN:  In order of problems listed above:  Chronic diastolic heart failure  - Overall stable.  - Continue with blood pressure control, current medications. On carvedilol, Cozaar, low-dose HCTZ.  Hypertensive heart disease with heart failure  - Very stable on current medications.  Aortic stenosis  - Mild on recent echocardiogram.  Pacemaker  - Currently atrial pacing. Doing well.  - Dr. Caryl Comes  CKD stage III  - Creatinine 1.9 last check.  - Avoiding spironolactone because of prior hyperkalemia.  Secondary pulmonary hypertension  - Asymptomatic. Continue to control blood pressure, diastolic heart failure. Very mild.  OK to proceed with dental implant from cardiac standpoint.  Medication Adjustments/Labs and Tests Ordered: Current medicines are reviewed at length with the patient today.  Concerns regarding medicines are outlined above.  Medication  changes, Labs and Tests ordered today are listed in the Patient Instructions below. Patient Instructions  Medication Instructions:  Your physician recommends that you continue on your current medications as directed. Please refer to the Current Medication list given to you today.   Labwork: none  Testing/Procedures: none  Follow-Up: Your physician wants you to follow-up in: 12 months.  You will receive a reminder letter in the mail two months in advance. If you don't receive a letter, please call our office to schedule the follow-up appointment.   Any Other Special Instructions Will Be Listed Below (If Applicable).     If you need a refill on your cardiac medications before your next appointment, please call your pharmacy.      Signed, Candee Furbish, MD  06/15/2016 9:53 AM    [facility] [address], [place], [place]  [address] Phone: [phone]; Fax: [phone]

## 2016-06-15 NOTE — Patient Instructions (Signed)

## 2016-06-30 ENCOUNTER — Encounter: Payer: Self-pay | Admitting: Family Medicine

## 2016-06-30 NOTE — Progress Notes (Signed)
[facility] note reviewed Stress incontinence Premarin and oxybutynin ordered

## 2016-07-02 ENCOUNTER — Encounter: Payer: Self-pay | Admitting: Internal Medicine

## 2016-07-06 ENCOUNTER — Ambulatory Visit: Payer: BLUE CROSS/BLUE SHIELD | Attending: Family Medicine | Admitting: Family Medicine

## 2016-07-06 ENCOUNTER — Encounter: Payer: Self-pay | Admitting: Family Medicine

## 2016-07-06 VITALS — BP 132/81 | HR 75 | Temp 98.5°F | Ht 62.0 in | Wt 183.2 lb

## 2016-07-06 DIAGNOSIS — I129 Hypertensive chronic kidney disease with stage 1 through stage 4 chronic kidney disease, or unspecified chronic kidney disease: Secondary | ICD-10-CM | POA: Diagnosis not present

## 2016-07-06 DIAGNOSIS — M25511 Pain in right shoulder: Secondary | ICD-10-CM | POA: Diagnosis not present

## 2016-07-06 DIAGNOSIS — M545 Low back pain, unspecified: Secondary | ICD-10-CM

## 2016-07-06 DIAGNOSIS — G8929 Other chronic pain: Secondary | ICD-10-CM | POA: Diagnosis not present

## 2016-07-06 DIAGNOSIS — I2510 Atherosclerotic heart disease of native coronary artery without angina pectoris: Secondary | ICD-10-CM | POA: Diagnosis not present

## 2016-07-06 DIAGNOSIS — R05 Cough: Secondary | ICD-10-CM | POA: Diagnosis not present

## 2016-07-06 DIAGNOSIS — N184 Chronic kidney disease, stage 4 (severe): Secondary | ICD-10-CM | POA: Insufficient documentation

## 2016-07-06 DIAGNOSIS — M25552 Pain in left hip: Secondary | ICD-10-CM | POA: Insufficient documentation

## 2016-07-06 DIAGNOSIS — M25551 Pain in right hip: Secondary | ICD-10-CM | POA: Diagnosis present

## 2016-07-06 DIAGNOSIS — I10 Essential (primary) hypertension: Secondary | ICD-10-CM

## 2016-07-06 DIAGNOSIS — I5032 Chronic diastolic (congestive) heart failure: Secondary | ICD-10-CM

## 2016-07-06 DIAGNOSIS — M546 Pain in thoracic spine: Secondary | ICD-10-CM | POA: Diagnosis present

## 2016-07-06 DIAGNOSIS — Z7982 Long term (current) use of aspirin: Secondary | ICD-10-CM | POA: Insufficient documentation

## 2016-07-06 MED ORDER — FUROSEMIDE 40 MG PO TABS: 40.0000 mg | ORAL_TABLET | Freq: Every day | ORAL | 0 refills | Status: DC

## 2016-07-06 MED ORDER — ACETAMINOPHEN-CODEINE #3 300-30 MG PO TABS: 1.0000 | ORAL_TABLET | Freq: Three times a day (TID) | ORAL | 2 refills | Status: DC | PRN

## 2016-07-06 MED ORDER — TURMERIC 500 MG PO CAPS: 500.0000 mg | ORAL_CAPSULE | Freq: Every day | ORAL | Status: DC

## 2016-07-06 MED ORDER — PANTOPRAZOLE SODIUM 40 MG PO TBEC
40.0000 mg | DELAYED_RELEASE_TABLET | Freq: Every day | ORAL | 3 refills | Status: DC
Start: 2016-07-06 — End: 2017-10-05

## 2016-07-06 NOTE — Assessment & Plan Note (Signed)
Chronic improved Continue current regimen Add tumeric

## 2016-07-06 NOTE — Patient Instructions (Addendum)
Heather [name] was seen today for back pain and hip pain.  Diagnoses and all orders for this visit:  Bilateral hip pain -     acetaminophen-codeine (TYLENOL #3) 300-30 MG tablet; Take 1 tablet by mouth every 8 (eight) hours as needed for moderate pain. -     Turmeric 500 MG CAPS; Take 500 mg by mouth daily.  Chronic bilateral low back pain without sciatica -     acetaminophen-codeine (TYLENOL #3) 300-30 MG tablet; Take 1 tablet by mouth every 8 (eight) hours as needed for moderate pain. -     Turmeric 500 MG CAPS; Take 500 mg by mouth daily.  Chronic right shoulder pain -     acetaminophen-codeine (TYLENOL #3) 300-30 MG tablet; Take 1 tablet by mouth every 8 (eight) hours as needed for moderate pain. -     Turmeric 500 MG CAPS; Take 500 mg by mouth daily.  CKD (chronic kidney disease) stage 4, GFR 15-29 ml/min (HCC) -     furosemide (LASIX) 40 MG tablet; Take 1 tablet (40 mg total) by mouth daily.  Essential hypertension -     furosemide (LASIX) 40 MG tablet; Take 1 tablet (40 mg total) by mouth daily.  Other orders -     pantoprazole (PROTONIX) 40 MG tablet; Take 1 tablet (40 mg total) by mouth daily.   Drink 100 oz of fluid   F/u in 4 weeks for weight check and cough  Dr. Adrian Blackwater

## 2016-07-06 NOTE — Assessment & Plan Note (Signed)
Not on diuretics with weight gain, swelling cough Add lasix 40 mg daily Close f/u in 4 weeks

## 2016-07-06 NOTE — Progress Notes (Signed)
Subjective:  Patient ID: Heather [name], female    DOB: 06/24/1941  Age: 75 y.o. MRN: [id number]  CC: Back Pain and Hip Pain   HPI Heather [name] has HTN, CAD, CKD stage 4, packemaker she presents for     1.. Pain in upper back, low back and hips:  These are chronic pains. Tylenol #3 and scheduled tylenol has helped with pain. She has seen ortho who recommends pain control for chronic back pain with radicular symptoms to hips. Dose not recommend surgery due to chronic medical conditions and patient's high pre-op risk. She has returned to the gym. She has tried glucosamine-chondroitin in the past with GI upset.  2. Weight gain: 16 lbs in past year. Feels weight gained worsened when she stopped aldactone. Has dry cough that is worse at night. Gets swelling in ankles.   Social History  Substance Use Topics  . Smoking status: Never Smoker  . Smokeless tobacco: Never Used  . Alcohol use No    Outpatient Medications Prior to Visit  Medication Sig Dispense Refill  . acetaminophen (TYLENOL 8 HOUR) 650 MG CR tablet Take 1 tablet (650 mg total) by mouth every 8 (eight) hours as needed for pain. 90 tablet 3  . acetaminophen-codeine (TYLENOL #3) 300-30 MG tablet Take 1 tablet by mouth every 8 (eight) hours as needed for moderate pain. 60 tablet 0  . amLODipine (NORVASC) 5 MG tablet Take 1 tablet (5 mg total) by mouth daily. 90 tablet 3  . aspirin 81 MG EC tablet Take 1 tablet (81 mg total) by mouth daily. 30 tablet 5  . carvedilol (COREG) 6.25 MG tablet TAKE 1 TABLET BY MOUTH 2 TIMES DAILY WITH A MEAL. 180 tablet 3  . diphenhydramine-acetaminophen (TYLENOL PM) 25-500 MG TABS Take 1 tablet by mouth at bedtime as needed (for sleep).    Marland Kitchen estrogens, conjugated, (PREMARIN) 0.625 MG tablet Take 0.625 mg by mouth 2 (two) times a week. Take daily for 21 days then do not take for 7 days.    . hydrochlorothiazide (MICROZIDE) 12.5 MG capsule Take 1 capsule (12.5 mg total) by mouth daily. 90 capsule 3    . losartan (COZAAR) 50 MG tablet Take 1 tablet (50 mg total) by mouth daily. 90 tablet 3  . oxybutynin (DITROPAN-XL) 10 MG 24 hr tablet Take 10 mg by mouth at bedtime.    . pantoprazole (PROTONIX) 40 MG tablet TAKE 1 TABLET BY MOUTH DAILY. 30 tablet 2  . Polyethyl Glycol-Propyl Glycol (SYSTANE) 0.4-0.3 % SOLN Apply 2 drops to eye as needed. 10 mL 0  . sodium chloride (OCEAN) 0.65 % SOLN nasal spray Place 1 spray into both nostrils as needed (dry nose). 30 mL 0   No facility-administered medications prior to visit.     ROS Review of Systems  Constitutional: Positive for unexpected weight change. Negative for chills and fever.  Eyes: Negative for visual disturbance.  Respiratory: Positive for cough. Negative for shortness of breath.   Cardiovascular: Positive for leg swelling. Negative for chest pain.  Gastrointestinal: Negative for abdominal pain and blood in stool.  Musculoskeletal: Positive for arthralgias and back pain.  Skin: Negative for rash.  Allergic/Immunologic: Negative for immunocompromised state.  Hematological: Negative for adenopathy. Does not bruise/bleed easily.  Psychiatric/Behavioral: Negative for dysphoric mood and suicidal ideas.    Objective:  BP 132/81 (BP Location: Right Arm, Patient Position: Sitting, Cuff Size: Small)   Pulse 75   Temp 98.5 F (36.9 C) (Oral)   Ht  5' 2"  (1.575 m)   Wt 183 lb 3.2 oz (83.1 kg)   LMP  (LMP Unknown)   SpO2 96%   BMI 33.51 kg/m   BP/Weight 07/06/2016 06/15/2016 97/74/1423  Systolic BP 953 202 334  Diastolic BP 81 70 73  Wt. (Lbs) 183.2 181 181  BMI 33.51 33.11 33.11    Physical Exam  Constitutional: She is oriented to person, place, and time. She appears well-developed and well-nourished. No distress.  HENT:  Head: Normocephalic and atraumatic.  Cardiovascular: Normal rate, regular rhythm, normal heart sounds and intact distal pulses.   Pulmonary/Chest: Effort normal and breath sounds normal.  Musculoskeletal: She  exhibits edema (trace in ankles ).  Neurological: She is alert and oriented to person, place, and time.  Skin: Skin is warm and dry. No rash noted.  Psychiatric: She has a normal mood and affect.    Assessment & Plan:  Heather [name] was seen today for back pain and hip pain.  Diagnoses and all orders for this visit:  Bilateral hip pain -     acetaminophen-codeine (TYLENOL #3) 300-30 MG tablet; Take 1 tablet by mouth every 8 (eight) hours as needed for moderate pain. -     Turmeric 500 MG CAPS; Take 500 mg by mouth daily.  Chronic bilateral low back pain without sciatica -     acetaminophen-codeine (TYLENOL #3) 300-30 MG tablet; Take 1 tablet by mouth every 8 (eight) hours as needed for moderate pain. -     Turmeric 500 MG CAPS; Take 500 mg by mouth daily.  Chronic right shoulder pain -     acetaminophen-codeine (TYLENOL #3) 300-30 MG tablet; Take 1 tablet by mouth every 8 (eight) hours as needed for moderate pain. -     Turmeric 500 MG CAPS; Take 500 mg by mouth daily.  CKD (chronic kidney disease) stage 4, GFR 15-29 ml/min (HCC) -     furosemide (LASIX) 40 MG tablet; Take 1 tablet (40 mg total) by mouth daily.  Essential hypertension -     furosemide (LASIX) 40 MG tablet; Take 1 tablet (40 mg total) by mouth daily.  Other orders -     pantoprazole (PROTONIX) 40 MG tablet; Take 1 tablet (40 mg total) by mouth daily.   There are no diagnoses linked to this encounter.  No orders of the defined types were placed in this encounter.   Follow-up: Return in about 4 weeks (around 08/03/2016) for HTN, weight check .   Boykin Nearing MD

## 2016-07-22 ENCOUNTER — Encounter: Payer: Self-pay | Admitting: Internal Medicine

## 2016-07-22 ENCOUNTER — Ambulatory Visit (INDEPENDENT_AMBULATORY_CARE_PROVIDER_SITE_OTHER): Payer: Self-pay | Admitting: Internal Medicine

## 2016-07-22 VITALS — BP 132/68 | HR 95 | Ht 62.0 in | Wt 189.0 lb

## 2016-07-22 DIAGNOSIS — I10 Essential (primary) hypertension: Secondary | ICD-10-CM

## 2016-07-22 DIAGNOSIS — I495 Sick sinus syndrome: Secondary | ICD-10-CM

## 2016-07-22 DIAGNOSIS — Z950 Presence of cardiac pacemaker: Secondary | ICD-10-CM

## 2016-07-22 DIAGNOSIS — N184 Chronic kidney disease, stage 4 (severe): Secondary | ICD-10-CM

## 2016-07-22 LAB — CUP PACEART INCLINIC DEVICE CHECK
Battery Remaining Longevity: 94 mo
Battery Voltage: 3.02 V
Brady Statistic AP VP Percent: 0.13 %
Brady Statistic AP VS Percent: 97.91 %
Brady Statistic AS VP Percent: 0 %
Brady Statistic AS VS Percent: 1.96 %
Brady Statistic RA Percent Paced: 88.55 %
Brady Statistic RV Percent Paced: 0.13 %
Date Time Interrogation Session: 20180103134232
Implantable Lead Implant Date: 20150729
Implantable Lead Implant Date: 20150729
Implantable Lead Location: 753859
Implantable Lead Location: 753860
Implantable Lead Model: 5076
Implantable Lead Model: 5076
Implantable Pulse Generator Implant Date: 20150729
Lead Channel Impedance Value: 380 Ohm
Lead Channel Impedance Value: 513 Ohm
Lead Channel Impedance Value: 532 Ohm
Lead Channel Impedance Value: 589 Ohm
Lead Channel Pacing Threshold Amplitude: 0.75 V
Lead Channel Pacing Threshold Amplitude: 1 V
Lead Channel Pacing Threshold Pulse Width: 0.4 ms
Lead Channel Pacing Threshold Pulse Width: 0.4 ms
Lead Channel Sensing Intrinsic Amplitude: 1.125 mV
Lead Channel Sensing Intrinsic Amplitude: 11.875 mV
Lead Channel Setting Pacing Amplitude: 2 V
Lead Channel Setting Pacing Amplitude: 2 V
Lead Channel Setting Pacing Pulse Width: 0.4 ms
Lead Channel Setting Sensing Sensitivity: 2 mV

## 2016-07-22 MED ORDER — FUROSEMIDE 40 MG PO TABS: ORAL_TABLET | ORAL | Status: DC

## 2016-07-22 NOTE — Progress Notes (Signed)
Patient Care Team: Boykin Nearing, MD as PCP - General (Family Medicine)   HPI  Heather [name] is a 76 y.o. female Seen in follow-up for sinus node dysfunction for which she underwent pacemaker implantation 7/15  The patient denies chest pain, shortness of breath, nocturnal dyspnea, orthopnea or peripheral edema.  There have been no palpitations, lightheadedness or syncope.   She has had problems however gaining weight. This despite eating. She was given a diuretic by her PCP but has not taken it for fear of her renal function.       DATE TEST    7/15 echo   EF 60    9/17 Echo Ef 60    LAE  (42/2.2/28)   Date      9/17    Cr  1.67      K   4.0     eviewed MS MD notes  Past Medical History:  Diagnosis Date  . CHF (congestive heart failure) ([place])   . Chronic back pain   . Crohn's disease ([place])   . GERD (gastroesophageal reflux disease)   . High cholesterol   . History of echocardiogram    Echo 12/16: EF 60-65%, no RWMA, Gr 2 DD, mild AS (mean 13 mmHg), mild AI, MAC, trivial MS, mild MR, mild LAE, mild RVE, mild to mod TR, PASP 36 mmHg  . Hypertension   . Migraine    "1-2 times/yr" (02/16/2014)  . Murmur, cardiac   . OSA on CPAP   . 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 Pulmonary hypertension   . Sinus bradycardia 01/29/2014  . Skin cancer 2003   "3 cut off face" (02/16/2014)  . Type II diabetes mellitus ([place])     Past Surgical History:  Procedure Laterality Date  . CARDIAC CATHETERIZATION  1995; 2005  . CATARACT EXTRACTION Bilateral 1982  . INSERT / REPLACE / REMOVE PACEMAKER  02/14/2014  . PERMANENT PACEMAKER INSERTION N/A 02/14/2014   Procedure: PERMANENT PACEMAKER INSERTION;  Surgeon: Deboraha Sprang, MD;  Location: [facility] CATH LAB;  Service: Cardiovascular;  Laterality: N/A;  . SKIN CANCER EXCISION  2003    "face X 3" (02/16/2014)  . TEMPORARY PACEMAKER INSERTION N/A 02/12/2014   Procedure: TEMPORARY PACEMAKER INSERTION;  Surgeon: Lorretta Harp, MD;  Location:  [facility] CATH LAB;  Service: Cardiovascular;  Laterality: N/A;  . VAGINAL HYSTERECTOMY  1972    Current Outpatient Prescriptions  Medication Sig Dispense Refill  . acetaminophen (TYLENOL 8 HOUR) 650 MG CR tablet Take 1 tablet (650 mg total) by mouth every 8 (eight) hours as needed for pain. 90 tablet 3  . acetaminophen-codeine (TYLENOL #3) 300-30 MG tablet Take 1 tablet by mouth every 8 (eight) hours as needed for moderate pain. 60 tablet 2  . amLODipine (NORVASC) 5 MG tablet Take 1 tablet (5 mg total) by mouth daily. 90 tablet 3  . aspirin 81 MG EC tablet Take 1 tablet (81 mg total) by mouth daily. 30 tablet 5  . carvedilol (COREG) 6.25 MG tablet TAKE 1 TABLET BY MOUTH 2 TIMES DAILY WITH A MEAL. 180 tablet 3  . diphenhydramine-acetaminophen (TYLENOL PM) 25-500 MG TABS Take 1 tablet by mouth at bedtime as needed (for sleep).    . furosemide (LASIX) 40 MG tablet Take 1 tablet (40 mg total) by mouth daily. 30 tablet 0  . hydrochlorothiazide (MICROZIDE) 12.5 MG capsule Take 1 capsule (12.5 mg total) by mouth daily. 90 capsule 3  . losartan (COZAAR) 50 MG tablet  Take 1 tablet (50 mg total) by mouth daily. 90 tablet 3  . oxybutynin (DITROPAN-XL) 10 MG 24 hr tablet Take 10 mg by mouth at bedtime.    . pantoprazole (PROTONIX) 40 MG tablet Take 1 tablet (40 mg total) by mouth daily. 90 tablet 3  . Polyethyl Glycol-Propyl Glycol (SYSTANE) 0.4-0.3 % SOLN Apply 2 drops to eye as needed. 10 mL 0  . sodium chloride (OCEAN) 0.65 % SOLN nasal spray Place 1 spray into both nostrils as needed (dry nose). 30 mL 0  . Turmeric 500 MG CAPS Take 500 mg by mouth daily.     No current facility-administered medications for this visit.     No Known Allergies  Review of Systems negative except from HPI and PMH  Physical Exam BP 132/68   Pulse 95   Ht _0  (1.575 m)   Wt 189 lb (85.7 kg)   LMP  (LMP Unknown)   SpO2 95%   BMI 34.57 kg/m  Well developed and well nourished in no acute distress HENT normal E  scleral and icterus clear Neck Supple JVP8+ HJR  carotids brisk and full Clear to ausculation Device pocket well healed; without hematoma or erythema.  There is no tethering Regular rate and rhythm, no murmurs gallops or rub Soft with active bowel sounds No clubbing cyanosis  Edema Alert and oriented, grossly normal motor and sensory function Skin Warm and Dry  ECG Atrial pacing _1  21/09/38  Assessment and  Plan  Sinus node dysfunction  Hypertension  HFpEF  Chronic Kidney Disease  Pacemaker implantation-St. Medtronic  The patient's device was interrogated and the information was fully reviewed.  The device was reprogrammed to  Maximize longevity  Device function is normal with good heart rate excursion  BP well controlled;  Will stop HCTZ  She is modestly volume overload. We will plan to use intermittent furosemide. We will start with 40 mg a day for which she has a prescription.  If she urinates on 40 then qod x 5 d, and if not will try 80 qod x 5d  With history of renal insufficiency, we will check a metabolic profile in about 10 days.  She's off to [place] for dental implant. I have reviewed with her daughter the use of the diuretics on an as-needed basis.

## 2016-07-22 NOTE — Patient Instructions (Signed)
Medication Instructions: - Your physician has recommended you make the following change in your medication:  1) Stop Hydrochlrothiazide (HCTZ)  2) Lasix (furosemide) 40 mg- take one tablet tomorrow (1/4), if you urinate well with this, then take one tablet (40 mg) once every other day x 5 days, then take one tablet daily as needed for swelling/ shortness of breath  ** if you do not urinate well after taking one tablet tomorrow (1/4), then on Friday (1/5) take 2 tablets (80 mg), then take 2 tablets (80 mg) once every other day x 5 days, then take one tablet daily as needed for swelling/ shortness of breath  Labwork: - Your physician recommends that you return for lab work: Friday 07/31/16- the lab is open from 7:30 am-4:45 pm (you do not have to be fasting for this lab work  Procedures/Testing: - none ordered  Follow-Up: - Remote monitoring is used to monitor your Pacemaker of ICD from home. This monitoring reduces the number of office visits required to check your device to one time per year. It allows [place] to keep an eye on the functioning of your device to ensure it is working properly. You are scheduled for a device check from home on 10/21/16. You may send your transmission at any time that day. If you have a wireless device, the transmission will be sent automatically. After your physician reviews your transmission, you will receive a postcard with your next transmission date.  - Your physician wants you to follow-up in: 6 months with Chanetta Marshall, NP for Dr. Caryl Comes. You will receive a reminder letter in the mail two months in advance. If you don't receive a letter, please call our office to schedule the follow-up appointment.  Any Additional Special Instructions Will Be Listed Below (If Applicable).     If you need a refill on your cardiac medications before your next appointment, please call your pharmacy.

## 2016-07-31 ENCOUNTER — Other Ambulatory Visit: Payer: BLUE CROSS/BLUE SHIELD | Admitting: *Deleted

## 2016-07-31 DIAGNOSIS — N184 Chronic kidney disease, stage 4 (severe): Secondary | ICD-10-CM

## 2016-08-01 LAB — BASIC METABOLIC PANEL
BUN/Creatinine Ratio: 23 (ref 12–28)
BUN: 34 mg/dL — ABNORMAL HIGH (ref 8–27)
CO2: 27 mmol/L (ref 18–29)
Calcium: 9.5 mg/dL (ref 8.7–10.3)
Chloride: 102 mmol/L (ref 96–106)
Creatinine, Ser: 1.47 mg/dL — ABNORMAL HIGH (ref 0.57–1.00)
GFR calc Af Amer: 40 mL/min/{1.73_m2} — ABNORMAL LOW (ref >59)
GFR calc non Af Amer: 34 mL/min/{1.73_m2} — ABNORMAL LOW (ref >59)
Glucose: 84 mg/dL (ref 65–99)
Potassium: 4 mmol/L (ref 3.5–5.2)
Sodium: 146 mmol/L — ABNORMAL HIGH (ref 134–144)

## 2016-10-21 ENCOUNTER — Encounter: Payer: Self-pay | Admitting: *Deleted

## 2016-10-21 ENCOUNTER — Telehealth: Payer: Self-pay | Admitting: Cardiology

## 2016-10-21 NOTE — Telephone Encounter (Signed)
LMOVM reminding pt to send remote transmission.   

## 2016-10-23 ENCOUNTER — Encounter: Payer: Self-pay | Admitting: Cardiology

## 2016-11-16 ENCOUNTER — Other Ambulatory Visit: Payer: Self-pay | Admitting: Family Medicine

## 2016-11-16 DIAGNOSIS — I10 Essential (primary) hypertension: Secondary | ICD-10-CM

## 2016-11-16 DIAGNOSIS — N184 Chronic kidney disease, stage 4 (severe): Secondary | ICD-10-CM

## 2016-11-20 NOTE — Telephone Encounter (Signed)
Please call patient to contact her cardiologist for lasix

## 2016-11-20 NOTE — Telephone Encounter (Signed)
Pt notified Call Cardiology for lasix refills  Pt verbalized understanding  Information given in Spanish

## 2016-11-25 ENCOUNTER — Encounter (HOSPITAL_COMMUNITY): Payer: Self-pay | Admitting: *Deleted

## 2016-11-25 ENCOUNTER — Emergency Department (HOSPITAL_COMMUNITY): Payer: Medicare Other

## 2016-11-25 ENCOUNTER — Emergency Department (HOSPITAL_COMMUNITY)
Admission: EM | Admit: 2016-11-25 | Discharge: 2016-11-25 | Disposition: A | Discharge status: Home / Self Care | Payer: Medicare Other | Attending: Emergency Medicine | Admitting: Emergency Medicine

## 2016-11-25 DIAGNOSIS — K219 Gastro-esophageal reflux disease without esophagitis: Secondary | ICD-10-CM | POA: Insufficient documentation

## 2016-11-25 DIAGNOSIS — Z950 Presence of cardiac pacemaker: Secondary | ICD-10-CM | POA: Insufficient documentation

## 2016-11-25 DIAGNOSIS — R1013 Epigastric pain: Secondary | ICD-10-CM | POA: Diagnosis not present

## 2016-11-25 DIAGNOSIS — I2510 Atherosclerotic heart disease of native coronary artery without angina pectoris: Secondary | ICD-10-CM | POA: Insufficient documentation

## 2016-11-25 DIAGNOSIS — E1122 Type 2 diabetes mellitus with diabetic chronic kidney disease: Secondary | ICD-10-CM | POA: Diagnosis not present

## 2016-11-25 DIAGNOSIS — I5030 Unspecified diastolic (congestive) heart failure: Secondary | ICD-10-CM | POA: Insufficient documentation

## 2016-11-25 DIAGNOSIS — Z79899 Other long term (current) drug therapy: Secondary | ICD-10-CM | POA: Insufficient documentation

## 2016-11-25 DIAGNOSIS — R072 Precordial pain: Secondary | ICD-10-CM | POA: Diagnosis present

## 2016-11-25 DIAGNOSIS — Z85828 Personal history of other malignant neoplasm of skin: Secondary | ICD-10-CM | POA: Insufficient documentation

## 2016-11-25 DIAGNOSIS — I130 Hypertensive heart and chronic kidney disease with heart failure and stage 1 through stage 4 chronic kidney disease, or unspecified chronic kidney disease: Secondary | ICD-10-CM | POA: Diagnosis not present

## 2016-11-25 DIAGNOSIS — N184 Chronic kidney disease, stage 4 (severe): Secondary | ICD-10-CM | POA: Diagnosis not present

## 2016-11-25 DIAGNOSIS — Z7982 Long term (current) use of aspirin: Secondary | ICD-10-CM | POA: Diagnosis not present

## 2016-11-25 LAB — BASIC METABOLIC PANEL
Anion gap: 9 (ref 5–15)
BUN: 25 mg/dL — ABNORMAL HIGH (ref 6–20)
CO2: 24 mmol/L (ref 22–32)
Calcium: 9 mg/dL (ref 8.9–10.3)
Chloride: 107 mmol/L (ref 101–111)
Creatinine, Ser: 1.63 mg/dL — ABNORMAL HIGH (ref 0.44–1.00)
GFR calc Af Amer: 34 mL/min — ABNORMAL LOW (ref >60)
GFR calc non Af Amer: 30 mL/min — ABNORMAL LOW (ref >60)
Glucose, Bld: 109 mg/dL — ABNORMAL HIGH (ref 65–99)
Potassium: 4.4 mmol/L (ref 3.5–5.1)
Sodium: 140 mmol/L (ref 135–145)

## 2016-11-25 LAB — CBC
HCT: 43.2 % (ref 36.0–46.0)
Hemoglobin: 14.1 g/dL (ref 12.0–15.0)
MCH: 29.4 pg (ref 26.0–34.0)
MCHC: 32.6 g/dL (ref 30.0–36.0)
MCV: 90.2 fL (ref 78.0–100.0)
Platelets: 229 10*3/uL (ref 150–400)
RBC: 4.79 MIL/uL (ref 3.87–5.11)
RDW: 14.8 % (ref 11.5–15.5)
WBC: 8.2 10*3/uL (ref 4.0–10.5)

## 2016-11-25 LAB — HEPATIC FUNCTION PANEL
ALT: 20 U/L (ref 14–54)
AST: 27 U/L (ref 15–41)
Albumin: 4 g/dL (ref 3.5–5.0)
Alkaline Phosphatase: 109 U/L (ref 38–126)
Bilirubin, Direct: 0.1 mg/dL (ref 0.1–0.5)
Indirect Bilirubin: 0.5 mg/dL (ref 0.3–0.9)
Total Bilirubin: 0.6 mg/dL (ref 0.3–1.2)
Total Protein: 8.2 g/dL — ABNORMAL HIGH (ref 6.5–8.1)

## 2016-11-25 LAB — I-STAT TROPONIN, ED
Troponin i, poc: 0 ng/mL (ref 0.00–0.08)
Troponin i, poc: 0 ng/mL (ref 0.00–0.08)

## 2016-11-25 LAB — LIPASE, BLOOD: Lipase: 33 U/L (ref 11–51)

## 2016-11-25 MED ORDER — SUCRALFATE 1 G PO TABS: 1.0000 g | ORAL_TABLET | Freq: Three times a day (TID) | ORAL | 0 refills | Status: DC

## 2016-11-25 MED ORDER — GI COCKTAIL ~~LOC~~
30.0000 mL | Freq: Once | ORAL | Status: AC
  Administered 2016-11-25: 30 mL via ORAL
  Filled 2016-11-25: qty 30

## 2016-11-25 NOTE — Discharge Instructions (Signed)
Please read and follow all provided instructions.  Your diagnoses today include:  1. Epigastric pain   2. Gastric reflux     Tests performed today include: An EKG of your heart A chest x-ray Cardiac enzymes - a blood test for heart muscle damage Blood counts and electrolytes Vital signs. See below for your results today.   Medications prescribed:   Take any prescribed medications only as directed.  Follow-up instructions: Please follow-up with your primary care provider as soon as you can for further evaluation of your symptoms.   Return instructions:  SEEK IMMEDIATE MEDICAL ATTENTION IF: You have severe chest pain, especially if the pain is crushing or pressure-like and spreads to the arms, back, neck, or jaw, or if you have sweating, nausea (feeling sick to your stomach), or shortness of breath. THIS IS AN EMERGENCY. Don't wait to see if the pain will go away. Get medical help at once. Call 911 or 0 (operator). DO NOT drive yourself to the hospital.  Your chest pain gets worse and does not go away with rest.  You have an attack of chest pain lasting longer than usual, despite rest and treatment with the medications your caregiver has prescribed.  You wake from sleep with chest pain or shortness of breath. You feel dizzy or faint. You have chest pain not typical of your usual pain for which you originally saw your caregiver.  You have any other emergent concerns regarding your health.  Additional Information: Chest pain comes from many different causes. Your caregiver has diagnosed you as having chest pain that is not specific for one problem, but does not require admission.  You are at low risk for an acute heart condition or other serious illness.   Your vital signs today were: BP (!) 142/89    Pulse 71    Temp 98.5 F (36.9 C) (Oral)    Resp 18    LMP  (LMP Unknown)    SpO2 98%  If your blood pressure (BP) was elevated above 135/85 this visit, please have this repeated by  your doctor within one month. --------------

## 2016-11-25 NOTE — ED Notes (Signed)
Pt stable, understands discharge instructions, and reasons for return.

## 2016-11-25 NOTE — ED Triage Notes (Signed)
Pt and family member reports that pt had onset of mid chest pain/pressure at 0530 with mild sob. No resp distress is noted. ekg done at triage.

## 2016-11-25 NOTE — ED Provider Notes (Signed)
[place] DEPT Provider Note   CSN: [id number] Arrival date & time: 11/25/16  1402     History   Chief Complaint Chief Complaint  Patient presents with  . Chest Pain    HPI Heather [name] is a 76 y.o. female.  HPI  76 y.o. female with a hx of CHF, Crohn's Disease, HLD, HTN, Pacemaker-Medtronic, DM2, presents to the Emergency Department today complaining of mid sternal chest pain with onset 0530 this AM while at rest. Notes pressure sensation that is burning. States that it radiated upwards into her throat. Lasted <3-74mn. Mild shortness of breath., but resolved. No N/V. No diaphoresis. Has not had pain since then. Noted hx same with ingestion of foods. No fevers. No URI symptoms. No pain currently. No other symptoms noted.   Past Medical History:  Diagnosis Date  . CHF (congestive heart failure) ([place]   . Chronic back pain   . Crohn's disease ([place]   . GERD (gastroesophageal reflux disease)   . High cholesterol   . History of echocardiogram    Echo 12/16: EF 60-65%, no RWMA, Gr 2 DD, mild AS (mean 13 mmHg), mild AI, MAC, trivial MS, mild MR, mild LAE, mild RVE, mild to mod TR, PASP 36 mmHg  . Hypertension   . Migraine    "1-2 times/yr" (02/16/2014)  . Murmur, cardiac   . OSA on CPAP   . 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 Pulmonary hypertension ([place]   . Sinus bradycardia 01/29/2014  . Skin cancer 2003   "3 cut off face" (02/16/2014)  . Type II diabetes mellitus ([facility]     Patient Active Problem List   Diagnosis Date Noted  . Scoliosis of lumbar spine 06/02/2016  . Complete tear of right rotator cuff 06/02/2016  . Low back pain 05/06/2016  . Right shoulder pain 05/06/2016  . Loss of hair 11/21/2015  . Postmenopausal atrophic vaginitis 09/16/2015  . CKD (chronic kidney disease) stage 4, GFR 15-29 ml/min (HCC) 05/06/2015  . Bilateral hip pain 05/06/2015  . Seborrheic keratoses 05/06/2015  . 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 Crohn's disease ([place] 02/16/2014  .  Prediabetes 01/29/2014  . OSA (obstructive sleep apnea) 01/29/2014  . Pulmonary hypertension ([place] 01/29/2014  . CAD (coronary artery disease) 01/29/2014  . Hypertension 01/29/2014  . Diastolic CHF ([place] 062/13/0865   Past Surgical History:  Procedure Laterality Date  . CARDIAC CATHETERIZATION  1995; 2005  . CATARACT EXTRACTION Bilateral 1982  . INSERT / REPLACE / REMOVE PACEMAKER  02/14/2014  . PERMANENT PACEMAKER INSERTION N/A 02/14/2014   Procedure: PERMANENT PACEMAKER INSERTION;  Surgeon: SDeboraha Sprang MD;  [facility];  Service: Cardiovascular;  Laterality: N/A;  . SKIN CANCER EXCISION  2003    "face X 3" (02/16/2014)  . TEMPORARY PACEMAKER INSERTION N/A 02/12/2014   Procedure: TEMPORARY PACEMAKER INSERTION;  Surgeon: JLorretta Harp MD;  [facility];  Service: Cardiovascular;  Laterality: N/A;  . VAGINAL HYSTERECTOMY  1972    OB History    No data available       Home Medications    Prior to Admission medications   Medication Sig Start Date End Date Taking? Authorizing Provider  acetaminophen (TYLENOL 8 HOUR) 650 MG CR tablet Take 1 tablet (650 mg total) by mouth every 8 (eight) hours as needed for pain. 11/21/15   Funches, JAdriana Mccallum MD  acetaminophen-codeine (TYLENOL #3) 300-30 MG tablet Take 1 tablet by mouth every 8 (eight) hours as needed for moderate pain. 07/06/16   Funches,  Josalyn, MD  amLODipine (NORVASC) 5 MG tablet Take 1 tablet (5 mg total) by mouth daily. 06/15/16   Jerline Pain, MD  aspirin 81 MG EC tablet Take 1 tablet (81 mg total) by mouth daily. 05/08/15   Funches, Adriana Mccallum, MD  carvedilol (COREG) 6.25 MG tablet TAKE 1 TABLET BY MOUTH 2 TIMES DAILY WITH A MEAL. 06/15/16   Jerline Pain, MD  diphenhydramine-acetaminophen (TYLENOL PM) 25-500 MG TABS Take 1 tablet by mouth at bedtime as needed (for sleep).    [provider]  furosemide (LASIX) 40 MG tablet Take one tablet (40 mg) by mouth once daily as needed for swelling/  shortness of breath 07/22/16   Deboraha Sprang, MD  losartan (COZAAR) 50 MG tablet Take 1 tablet (50 mg total) by mouth daily. 06/15/16   Jerline Pain, MD  oxybutynin (DITROPAN-XL) 10 MG 24 hr tablet Take 10 mg by mouth at bedtime.    [provider]  pantoprazole (PROTONIX) 40 MG tablet Take 1 tablet (40 mg total) by mouth daily. 07/06/16   Funches, Adriana Mccallum, MD  Polyethyl Glycol-Propyl Glycol (SYSTANE) 0.4-0.3 % SOLN Apply 2 drops to eye as needed. 09/16/15   Funches, Adriana Mccallum, MD  sodium chloride (OCEAN) 0.65 % SOLN nasal spray Place 1 spray into both nostrils as needed (dry nose). 09/16/15   Funches, Adriana Mccallum, MD  Turmeric 500 MG CAPS Take 500 mg by mouth daily. 07/06/16   Boykin Nearing, MD    Family History Family History  Problem Relation Age of Onset  . Adopted: Yes  . Hypertension Mother   . Heart disease Mother     Social History Social History  Substance Use Topics  . Smoking status: Never Smoker  . Smokeless tobacco: Never Used  . Alcohol use No     Allergies   Patient has no known allergies.   Review of Systems Review of Systems ROS reviewed and all are negative for acute change except as noted in the HPI.  Physical Exam Updated Vital Signs BP 112/61 (BP Location: Right Arm)   Pulse 70   Temp 98.5 F (36.9 C) (Oral)   Resp 18   LMP  (LMP Unknown)   SpO2 99%   Physical Exam  Constitutional: She is oriented to person, place, and time. Vital signs are normal. She appears well-developed and well-nourished. No distress.  NAD  HENT:  Head: Normocephalic and atraumatic.  Right Ear: Hearing, tympanic membrane, external ear and ear canal normal.  Left Ear: Hearing, tympanic membrane, external ear and ear canal normal.  Nose: Nose normal.  Mouth/Throat: Uvula is midline, oropharynx is clear and moist and mucous membranes are normal. No trismus in the jaw. No oropharyngeal exudate, posterior oropharyngeal erythema or tonsillar abscesses.  Eyes:  Conjunctivae and EOM are normal. Pupils are equal, round, and reactive to light.  Neck: Normal range of motion. Neck supple. No tracheal deviation present.  Cardiovascular: Normal rate, regular rhythm, S1 normal, S2 normal, normal heart sounds, intact distal pulses and normal pulses.   Pulmonary/Chest: Effort normal and breath sounds normal. No respiratory distress. She has no decreased breath sounds. She has no wheezes. She has no rhonchi. She has no rales.  Abdominal: Soft. Normal appearance and bowel sounds are normal. There is tenderness in the epigastric area.  Musculoskeletal: Normal range of motion.  Neurological: She is alert and oriented to person, place, and time.  Skin: Skin is warm and dry.  Psychiatric: She has a normal mood and affect. Her speech  is normal and behavior is normal. Thought content normal.  Nursing note and vitals reviewed.  ED Treatments / Results  Labs (all labs ordered are listed, but only abnormal results are displayed) Labs Reviewed  BASIC METABOLIC PANEL - Abnormal; Notable for the following:       Result Value   Glucose, Bld 109 (*)    BUN 25 (*)    Creatinine, Ser 1.63 (*)    GFR calc non Af Amer 30 (*)    GFR calc Af Amer 34 (*)    All other components within normal limits  HEPATIC FUNCTION PANEL - Abnormal; Notable for the following:    Total Protein 8.2 (*)    All other components within normal limits  CBC  LIPASE, BLOOD  I-STAT TROPOININ, ED  I-STAT TROPOININ, ED    EKG  EKG Interpretation None       Radiology Dg Chest 2 View  Result Date: 11/25/2016 CLINICAL DATA:  Chest pain EXAM: CHEST  2 VIEW COMPARISON:  05/20/2016 FINDINGS: Normal heart size and mediastinal contours. Dual-chamber pacer leads from the left. There is no edema, consolidation, effusion, or pneumothorax. Minimal scarring or atelectasis at the peripheral left base. No acute osseous finding. IMPRESSION: No evidence of active disease. Electronically Signed   By: Monte Fantasia M.D.   On: 11/25/2016 14:54    Procedures Procedures (including critical care time)  Medications Ordered in ED Medications  gi cocktail (Maalox,Lidocaine,Donnatal) (30 mLs Oral Given 11/25/16 1906)   Initial Impression / Assessment and Plan / ED Course  I have reviewed the triage vital signs and the nursing notes.  Pertinent labs & imaging results that were available during my care of the patient were reviewed by me and considered in my medical decision making (see chart for details).  Final Clinical Impressions(s) / ED Diagnoses  {I have reviewed and evaluated the relevant laboratory values. {I have reviewed and evaluated the relevant imaging studies. {I have interpreted the relevant EKG. {I have reviewed the relevant previous healthcare records.  {I obtained HPI from historian. {Patient discussed with supervising physician.  ED Course:  Assessment: Pt is a 76 y.o. female presents with CP this AM. Notes radiation into throat. Felt like burning sensation. Hx same. No no N/V. No diaphoresis. Given GI cocktail in ED with improvement of symptoms. Patient is to be discharged with recommendation to follow up with Gastroenterology in regards to today's hospital visit. Chest pain is not likely of cardiac or pulmonary etiology d/t presentation, perc negative, VSS, no tracheal deviation, no JVD or new murmur, RRR, breath sounds equal bilaterally, EKG without acute abnormalities, negative troponin x2, and negative CXR. Pt has been advised start a PPI and return to the ED is CP becomes exertional, associated with diaphoresis or nausea, radiates to left jaw/arm, worsens or becomes concerning in any way. Pt appears reliable for follow up and is agreeable to discharge. Patient is in no acute distress. Vital Signs are stable. Patient is able to ambulate. Patient able to tolerate PO.  Disposition/Plan:  DC Home Additional Verbal discharge instructions given and discussed with patient.  Pt Instructed to  f/u with GI in the next week for evaluation and treatment of symptoms. Return precautions given Pt acknowledges and agrees with plan  Supervising Physician Long, Wonda Olds, MD  Final diagnoses:  Epigastric pain  Gastric reflux    New Prescriptions New Prescriptions   No medications on file     Shary Decamp, Hershal Coria 11/25/16 0762  Margette Fast, MD 11/26/16 1104

## 2016-12-01 ENCOUNTER — Ambulatory Visit (INDEPENDENT_AMBULATORY_CARE_PROVIDER_SITE_OTHER): Payer: Medicaid Other | Admitting: *Deleted

## 2016-12-01 ENCOUNTER — Telehealth: Payer: Self-pay | Admitting: Cardiology

## 2016-12-01 DIAGNOSIS — I495 Sick sinus syndrome: Secondary | ICD-10-CM | POA: Diagnosis not present

## 2016-12-01 NOTE — Progress Notes (Signed)
Remote pacemaker transmission.   

## 2016-12-01 NOTE — Telephone Encounter (Signed)
Confirmed remote transmission w/ pt daughter.

## 2016-12-02 ENCOUNTER — Encounter: Payer: Self-pay | Admitting: Family Medicine

## 2016-12-02 ENCOUNTER — Encounter: Payer: Self-pay | Admitting: Cardiology

## 2016-12-02 LAB — CUP PACEART REMOTE DEVICE CHECK
Battery Remaining Longevity: 86 mo
Battery Voltage: 3.01 V
Brady Statistic AP VP Percent: 0.03 %
Brady Statistic AP VS Percent: 94.39 %
Brady Statistic AS VP Percent: 0 %
Brady Statistic AS VS Percent: 5.58 %
Brady Statistic RA Percent Paced: 94.28 %
Brady Statistic RV Percent Paced: 0.03 %
Date Time Interrogation Session: 20180515153931
Implantable Lead Implant Date: 20150729
Implantable Lead Implant Date: 20150729
Implantable Lead Location: 753859
Implantable Lead Location: 753860
Implantable Lead Model: 5076
Implantable Lead Model: 5076
Implantable Pulse Generator Implant Date: 20150729
Lead Channel Impedance Value: 399 Ohm
Lead Channel Impedance Value: 513 Ohm
Lead Channel Impedance Value: 532 Ohm
Lead Channel Impedance Value: 570 Ohm
Lead Channel Pacing Threshold Amplitude: 0.625 V
Lead Channel Pacing Threshold Amplitude: 0.875 V
Lead Channel Pacing Threshold Pulse Width: 0.4 ms
Lead Channel Pacing Threshold Pulse Width: 0.4 ms
Lead Channel Sensing Intrinsic Amplitude: 1.875 mV
Lead Channel Sensing Intrinsic Amplitude: 1.875 mV
Lead Channel Sensing Intrinsic Amplitude: 11.5 mV
Lead Channel Sensing Intrinsic Amplitude: 11.5 mV
Lead Channel Setting Pacing Amplitude: 1.75 V
Lead Channel Setting Pacing Amplitude: 2 V
Lead Channel Setting Pacing Pulse Width: 0.4 ms
Lead Channel Setting Sensing Sensitivity: 2 mV

## 2016-12-15 ENCOUNTER — Other Ambulatory Visit: Payer: Self-pay | Admitting: Gastroenterology

## 2016-12-15 ENCOUNTER — Telehealth: Payer: Self-pay | Admitting: Cardiology

## 2016-12-15 DIAGNOSIS — R1013 Epigastric pain: Secondary | ICD-10-CM | POA: Diagnosis not present

## 2016-12-15 DIAGNOSIS — K219 Gastro-esophageal reflux disease without esophagitis: Secondary | ICD-10-CM | POA: Diagnosis not present

## 2016-12-15 DIAGNOSIS — Z1211 Encounter for screening for malignant neoplasm of colon: Secondary | ICD-10-CM | POA: Diagnosis not present

## 2016-12-15 MED ORDER — AMLODIPINE BESYLATE 5 MG PO TABS: 5.0000 mg | ORAL_TABLET | Freq: Every day | ORAL | 1 refills | Status: DC

## 2016-12-15 NOTE — Telephone Encounter (Signed)
NEW MESSAGE   *STAT* If patient is at the pharmacy, call can be transferred to refill team.   1. Which medications need to be refilled? (please list name of each medication and dose if known) AMLODIPINE 5MG   2. Which pharmacy/location (including street and city if local pharmacy) is medication to be sent to? [place]  3. Do they need a 30 day or 90 day supply? 90DAY

## 2016-12-25 NOTE — Progress Notes (Signed)
I called patient using pacific interpreter, [place], [place] # [phone].  There was no answer.  Message to arrive at 0800, Monday, June 11. Nothing to eat or drink after midnight Sunday .  Do not take any medications, since we were able to up dated medications. For any questions call Endo dept after 0800 Monday am.

## 2016-12-28 ENCOUNTER — Encounter (HOSPITAL_COMMUNITY)
Admission: RE | Disposition: A | Discharge status: Home / Self Care | Payer: Self-pay | Source: Ambulatory Visit | Attending: Gastroenterology

## 2016-12-28 ENCOUNTER — Ambulatory Visit (HOSPITAL_COMMUNITY): Payer: Medicare Other | Admitting: Anesthesiology

## 2016-12-28 ENCOUNTER — Encounter (HOSPITAL_COMMUNITY): Payer: Self-pay | Admitting: *Deleted

## 2016-12-28 ENCOUNTER — Ambulatory Visit (HOSPITAL_COMMUNITY)
Admission: RE | Admit: 2016-12-28 | Discharge: 2016-12-28 | Disposition: A | Discharge status: Home / Self Care | Payer: Medicare Other | Source: Ambulatory Visit | Attending: Gastroenterology | Admitting: Gastroenterology

## 2016-12-28 DIAGNOSIS — K224 Dyskinesia of esophagus: Secondary | ICD-10-CM | POA: Insufficient documentation

## 2016-12-28 DIAGNOSIS — Z7982 Long term (current) use of aspirin: Secondary | ICD-10-CM | POA: Diagnosis not present

## 2016-12-28 DIAGNOSIS — Z79899 Other long term (current) drug therapy: Secondary | ICD-10-CM | POA: Diagnosis not present

## 2016-12-28 DIAGNOSIS — I083 Combined rheumatic disorders of mitral, aortic and tricuspid valves: Secondary | ICD-10-CM | POA: Diagnosis not present

## 2016-12-28 DIAGNOSIS — I110 Hypertensive heart disease with heart failure: Secondary | ICD-10-CM | POA: Diagnosis not present

## 2016-12-28 DIAGNOSIS — E119 Type 2 diabetes mellitus without complications: Secondary | ICD-10-CM | POA: Insufficient documentation

## 2016-12-28 DIAGNOSIS — I509 Heart failure, unspecified: Secondary | ICD-10-CM | POA: Diagnosis not present

## 2016-12-28 DIAGNOSIS — K449 Diaphragmatic hernia without obstruction or gangrene: Secondary | ICD-10-CM | POA: Insufficient documentation

## 2016-12-28 DIAGNOSIS — K2950 Unspecified chronic gastritis without bleeding: Secondary | ICD-10-CM | POA: Insufficient documentation

## 2016-12-28 DIAGNOSIS — K2970 Gastritis, unspecified, without bleeding: Secondary | ICD-10-CM | POA: Diagnosis not present

## 2016-12-28 DIAGNOSIS — G4730 Sleep apnea, unspecified: Secondary | ICD-10-CM | POA: Insufficient documentation

## 2016-12-28 DIAGNOSIS — R1013 Epigastric pain: Secondary | ICD-10-CM | POA: Diagnosis not present

## 2016-12-28 DIAGNOSIS — Z9989 Dependence on other enabling machines and devices: Secondary | ICD-10-CM | POA: Insufficient documentation

## 2016-12-28 DIAGNOSIS — B9681 Helicobacter pylori [H. pylori] as the cause of diseases classified elsewhere: Secondary | ICD-10-CM | POA: Diagnosis not present

## 2016-12-28 HISTORY — PX: ESOPHAGOGASTRODUODENOSCOPY (EGD) WITH PROPOFOL: SHX5813

## 2016-12-28 SURGERY — ESOPHAGOGASTRODUODENOSCOPY (EGD) WITH PROPOFOL
Anesthesia: Monitor Anesthesia Care

## 2016-12-28 MED ORDER — LACTATED RINGERS IV SOLN
INTRAVENOUS | Status: DC
  Administered 2016-12-28: 1000 mL via INTRAVENOUS
  Administered 2016-12-28: 09:00:00 via INTRAVENOUS

## 2016-12-28 MED ORDER — SODIUM CHLORIDE 0.9 % IV SOLN: INTRAVENOUS | Status: DC

## 2016-12-28 MED ORDER — PROPOFOL 500 MG/50ML IV EMUL
INTRAVENOUS | Status: DC | PRN
  Administered 2016-12-28: 100 ug/kg/min via INTRAVENOUS

## 2016-12-28 MED ORDER — LIDOCAINE HCL (CARDIAC) 20 MG/ML IV SOLN
INTRAVENOUS | Status: DC | PRN
  Administered 2016-12-28: 60 mg via INTRAVENOUS

## 2016-12-28 SURGICAL SUPPLY — 14 items

## 2016-12-28 NOTE — Transfer of Care (Signed)
Immediate Anesthesia Transfer of Care Note  Patient: Heather [name]  Procedure(s) Performed: Procedure(s): ESOPHAGOGASTRODUODENOSCOPY (EGD) WITH PROPOFOL (N/A)  Patient Location: Endoscopy Unit  Anesthesia Type:MAC  Level of Consciousness: awake, alert  and oriented  Airway & Oxygen Therapy: Patient Spontanous Breathing and Patient connected to nasal cannula oxygen  Post-op Assessment: Report given to RN and Post -op Vital signs reviewed and stable  Post vital signs: Reviewed and stable  Last Vitals:  Vitals:   12/28/16 0830 12/28/16 0922  BP: (!) 149/72 101/62  Pulse: 75 71  Resp: 14 15  Temp: 36.6 C 36.4 C    Last Pain:  Vitals:   12/28/16 0922  TempSrc: Oral         Complications: No apparent anesthesia complications

## 2016-12-28 NOTE — H&P (Signed)
Heather [name] is a 76 y.o. female has presented with epigastric abdominal pain. Patient with history of ulcer disease.   The various methods of treatment have been discussed with the patient and family. After consideration of risks, benefits and other options for treatment, the patient has consented to  Procedure(s): EGD  as a surgical intervention .  The patient's history has been reviewed, patient examined, no change in status, stable for surgery.  I have reviewed the patient's chart and labs.  Questions were answered to the patient's satisfaction.     Otis Brace MD, FACP 12/28/2016, 9:05 AM  Pager [phone]  If no answer or after 5 PM call [phone]

## 2016-12-28 NOTE — Anesthesia Preprocedure Evaluation (Signed)
Anesthesia Evaluation  Patient identified by MRN, date of birth, ID band Patient awake    Reviewed: Allergy & Precautions, NPO status , Patient's Chart, lab work & pertinent test results  Airway Mallampati: II  TM Distance: >3 FB Neck ROM: Full    Dental no notable dental hx.    Pulmonary sleep apnea and Continuous Positive Airway Pressure Ventilation ,    Pulmonary exam normal breath sounds clear to auscultation       Cardiovascular hypertension, +CHF  Normal cardiovascular exam+ pacemaker  Rhythm:Regular Rate:Normal  History of echocardiogram  Echo 12/16: EF 60-65%, no RWMA, Gr 2 DD, mild AS (mean 13 mmHg), mild AI, MAC, trivial MS, mild MR, mild LAE, mild RVE, mild to mod TR, PASP 36 mmHg    Neuro/Psych negative neurological ROS  negative psych ROS   GI/Hepatic negative GI ROS, Neg liver ROS,   Endo/Other  diabetes  Renal/GU negative Renal ROS  negative genitourinary   Musculoskeletal negative musculoskeletal ROS (+)   Abdominal   Peds negative pediatric ROS (+)  Hematology negative hematology ROS (+)   Anesthesia Other Findings   Reproductive/Obstetrics negative OB ROS                             Anesthesia Physical Anesthesia Plan  ASA: III  Anesthesia Plan: MAC   Post-op Pain Management:    Induction: Intravenous  PONV Risk Score and Plan: 1 and Ondansetron and Dexamethasone  Airway Management Planned: Nasal Cannula  Additional Equipment:   Intra-op Plan:   Post-operative Plan:   Informed Consent: I have reviewed the patients History and Physical, chart, labs and discussed the procedure including the risks, benefits and alternatives for the proposed anesthesia with the patient or authorized representative who has indicated his/her understanding and acceptance.   Dental advisory given  Plan Discussed with: CRNA and Surgeon  Anesthesia Plan Comments:          Anesthesia Quick Evaluation

## 2016-12-28 NOTE — Op Note (Signed)
[facility] Patient Name: Heather [name] Procedure Date : 12/28/2016 MRN: [id number] Attending MD: Otis Brace , MD Date of Birth: October 19, 1940 CSN: [id number] Age: 76 Admit Type: Outpatient Procedure:                Upper GI endoscopy Indications:              Epigastric abdominal pain Providers:                Otis Brace, MD, Vista Lawman, RN, Corliss Parish, Technician Referring MD:              Medicines:                Sedation Administered by an Anesthesia Professional Complications:            No immediate complications. Estimated Blood Loss:     Estimated blood loss was minimal. Procedure:                Pre-Anesthesia Assessment:                           - Prior to the procedure, a History and Physical                            was performed, and patient medications and                            allergies were reviewed. The patient's tolerance of                            previous anesthesia was also reviewed. The risks                            and benefits of the procedure and the sedation                            options and risks were discussed with the patient.                            All questions were answered, and informed consent                            was obtained. Prior Anticoagulants: The patient has                            taken no previous anticoagulant or antiplatelet                            agents. ASA Grade Assessment: III - A patient with                            severe systemic disease. After reviewing the risks  and benefits, the patient was deemed in                            satisfactory condition to undergo the procedure.                           After obtaining informed consent, the endoscope was                            passed under direct vision. Throughout the                            procedure, the patient's blood pressure, pulse, and                      oxygen saturations were monitored continuously. The                            [id number] ([id number]) scope was introduced through the                            mouth, and advanced to the second part of duodenum.                            The upper GI endoscopy was accomplished without                            difficulty. The patient tolerated the procedure                            well. Scope In: Scope Out: Findings:      The Z-line was regular and was found 36 cm from the incisors.      A small hiatal hernia was present.      Abnormal motility was noted in the esophagus.      Scattered minimal inflammation characterized by erythema was found in       the gastric antrum. Biopsies were taken with a cold forceps for       histology.      The cardia and gastric fundus were normal on retroflexion.      The duodenal bulb, first portion of the duodenum and second portion of       the duodenum were normal. Impression:               - Z-line regular, 36 cm from the incisors.                           - Small hiatal hernia.                           - Abnormal esophageal motility.                           - Gastritis. Biopsied.                           - Normal duodenal bulb, first portion  of the                            duodenum and second portion of the duodenum. Moderate Sedation:      Moderate (conscious) sedation was personally administered by an       anesthesia professional. The following parameters were monitored: oxygen       saturation, heart rate, blood pressure, and response to care. Recommendation:           - Patient has a contact number available for                            emergencies. The signs and symptoms of potential                            delayed complications were discussed with the                            patient. Return to normal activities tomorrow.                            Written discharge instructions were provided to the                             patient.                           - Resume previous diet.                           - Continue present medications.                           - Await pathology results.                           - Return to my office as previously scheduled. Procedure Code(s):        --- Professional ---                           [phone], Esophagogastroduodenoscopy, flexible,                            transoral; with biopsy, single or multiple Diagnosis Code(s):        --- Professional ---                           K44.9, Diaphragmatic hernia without obstruction or                            gangrene                           K22.4, Dyskinesia of esophagus                           K29.70, Gastritis, unspecified, without bleeding  R10.13, Epigastric pain CPT copyright 2016 American Medical Association. All rights reserved. The codes documented in this report are preliminary and upon coder review may  be revised to meet current compliance requirements. Otis Brace, MD Otis Brace, MD 12/28/2016 9:25:46 AM Number of Addenda: 0

## 2016-12-28 NOTE — Anesthesia Postprocedure Evaluation (Signed)
Anesthesia Post Note  Patient: Heather [name]  Procedure(s) Performed: Procedure(s) (LRB): ESOPHAGOGASTRODUODENOSCOPY (EGD) WITH PROPOFOL (N/A)     Patient location during evaluation: PACU Anesthesia Type: MAC Level of consciousness: awake and alert Pain management: pain level controlled Vital Signs Assessment: post-procedure vital signs reviewed and stable Respiratory status: spontaneous breathing, nonlabored ventilation, respiratory function stable and patient connected to nasal cannula oxygen Cardiovascular status: stable and blood pressure returned to baseline Anesthetic complications: no    Last Vitals:  Vitals:   12/28/16 0922 12/28/16 0935  BP: 101/62 123/65  Pulse: 71 70  Resp: 15 15  Temp: 36.4 C     Last Pain:  Vitals:   12/28/16 0922  TempSrc: Oral                 Kingdavid Leinbach S

## 2016-12-28 NOTE — Discharge Instructions (Signed)
Esofagogastroduodenoscopia, cuidados posteriores (Esophagogastroduodenoscopy, Care After) Siga estas instrucciones durante las prximas semanas. Estas indicaciones le proporcionan informacin acerca de cmo deber cuidarse despus del procedimiento. El mdico tambin podr darle instrucciones ms especficas. El tratamiento ha sido planificado segn las prcticas mdicas actuales, pero en algunos casos pueden ocurrir problemas. Comunquese con el mdico si tiene algn problema o dudas despus del procedimiento. QU ESPERAR DESPUS DEL PROCEDIMIENTO Despus del procedimiento, es comn [employer] siguientes sntomas:  Dolor de Investment banker, operational.  Nuseas.  Meteorismo.  Mareos.  Fatiga. INSTRUCCIONES PARA EL CUIDADO EN EL HOGAR  No coma ni beba nada hasta que el efecto del anestsico (anestesia local) haya desaparecido y vuelva a tener reflejo farngeo. Si puede tragar con comodidad, significa que el efecto de la anestesia local ha desaparecido.  No conduzca durante 24horas si le dieron un medicamento para ayudarlo a que se relaje (sedante).  Si durante el procedimiento el mdico tom [place] de tejido para Personnel officer, asegrese de [employer] de la prueba. Esta es su responsabilidad. Consulte a su mdico o en el departamento donde se realice el estudio cundo estarn [employer].  Concurra a todas las visitas de control como se lo haya indicado el mdico. Esto es importante. SOLICITE ATENCIN MDICA SI:  No puede dejar de toser.  No puede orinar.  Orina menos que lo habitual. SOLICITE ATENCIN MDICA DE INMEDIATO SI:  Presenta dificultad para tragar.  No puede comer ni beber.  Tiene dolor de garganta o de pecho que [place].  Se siente mareado o sufre un desmayo.  Se desmaya.  Tiene nuseas o vmitos.  Tiene escalofros.  Tiene fiebre.  Siente un dolor abdominal intenso.  La materia fecal es negra, de aspecto alquitranado o sanguinolenta. Esta  informacin no tiene Marine scientist el consejo del mdico. Asegrese de hacerle al mdico cualquier pregunta que tenga. Document Released: 10/31/2012 Document Revised: 10/28/2015 Document Reviewed: 05/30/2015 Elsevier Interactive Patient Education  2018 [place] HAD AN ENDOSCOPIC PROCEDURE TODAY: Refer to the procedure report and other information in the discharge instructions given to you for any specific questions about what was found during the examination. If this information does not answer your questions, please call [facility] office at [phone] to clarify.   YOU SHOULD EXPECT: Some feelings of bloating in the abdomen. Passage of more gas than usual. Walking can help get rid of the air that was put into your GI tract during the procedure and reduce the bloating. If you had a lower endoscopy (such as a colonoscopy or flexible sigmoidoscopy) you may notice spotting of blood in your stool or on the toilet paper. Some abdominal soreness may be present for a day or two, also.  DIET: Your first meal following the procedure should be a light meal and then it is ok to progress to your normal diet. A half-sandwich or bowl of soup is an example of a good first meal. Heavy or fried foods are harder to digest and may make you feel nauseous or bloated. Drink plenty of fluids but you should avoid alcoholic beverages for 24 hours. If you had a esophageal dilation, please see attached instructions for diet.   ACTIVITY: Your care partner should take you home directly after the procedure. You should plan to take it easy, moving slowly for the rest of the day. You can resume normal activity the day after the procedure however YOU SHOULD NOT DRIVE, use power tools, machinery or perform tasks that involve climbing or major  physical exertion for 24 hours (because of the sedation medicines used during the test).   SYMPTOMS TO REPORT IMMEDIATELY: A gastroenterologist can be reached at any hour. Please  call [phone]  for any of the following symptoms:  Following lower endoscopy (colonoscopy, flexible sigmoidoscopy) Excessive amounts of blood in the stool  Significant tenderness, worsening of abdominal pains  Swelling of the abdomen that is new, acute  Fever of 100 or higher  Following upper endoscopy (EGD, EUS, ERCP, esophageal dilation) Vomiting of blood or coffee ground material  New, significant abdominal pain  New, significant chest pain or pain under the shoulder blades  Painful or persistently difficult swallowing  New shortness of breath  Black, tarry-looking or red, bloody stools  FOLLOW UP:  If any biopsies were taken you will be contacted by phone or by letter within the next 1-3 weeks. Call [phone]  if you have not heard about the biopsies in 3 weeks.  Please also call with any specific questions about appointments or follow up tests.

## 2016-12-28 NOTE — Anesthesia Procedure Notes (Addendum)
Procedure Name: MAC Date/Time: 12/28/2016 9:13 AM Performed by: Teressa Lower Pre-anesthesia Checklist: Emergency Drugs available, Suction available, Patient identified, Patient being monitored and Timeout performed Patient Re-evaluated:Patient Re-evaluated prior to inductionOxygen Delivery Method: Nasal cannula

## 2016-12-29 ENCOUNTER — Encounter (HOSPITAL_COMMUNITY): Payer: Self-pay | Admitting: Gastroenterology

## 2017-03-02 ENCOUNTER — Encounter: Payer: Medicaid Other | Admitting: *Deleted

## 2017-03-05 ENCOUNTER — Ambulatory Visit: Payer: Medicaid Other | Admitting: Family Medicine

## 2017-03-08 ENCOUNTER — Encounter: Payer: Self-pay | Admitting: Family Medicine

## 2017-03-08 ENCOUNTER — Ambulatory Visit (INDEPENDENT_AMBULATORY_CARE_PROVIDER_SITE_OTHER): Payer: Medicare Other | Admitting: Family Medicine

## 2017-03-08 ENCOUNTER — Ambulatory Visit (INDEPENDENT_AMBULATORY_CARE_PROVIDER_SITE_OTHER): Payer: Medicare Other

## 2017-03-08 VITALS — BP 168/88 | HR 79 | Temp 98.2°F | Ht 62.0 in | Wt 184.8 lb

## 2017-03-08 DIAGNOSIS — H9193 Unspecified hearing loss, bilateral: Secondary | ICD-10-CM

## 2017-03-08 DIAGNOSIS — I10 Essential (primary) hypertension: Secondary | ICD-10-CM | POA: Diagnosis not present

## 2017-03-08 DIAGNOSIS — R062 Wheezing: Secondary | ICD-10-CM

## 2017-03-08 DIAGNOSIS — R0602 Shortness of breath: Secondary | ICD-10-CM | POA: Diagnosis not present

## 2017-03-08 DIAGNOSIS — G4733 Obstructive sleep apnea (adult) (pediatric): Secondary | ICD-10-CM | POA: Diagnosis not present

## 2017-03-08 DIAGNOSIS — N184 Chronic kidney disease, stage 4 (severe): Secondary | ICD-10-CM | POA: Diagnosis not present

## 2017-03-08 DIAGNOSIS — Z13220 Encounter for screening for lipoid disorders: Secondary | ICD-10-CM | POA: Diagnosis not present

## 2017-03-08 DIAGNOSIS — M7071 Other bursitis of hip, right hip: Secondary | ICD-10-CM | POA: Diagnosis not present

## 2017-03-08 MED ORDER — BECLOMETHASONE DIPROPIONATE 80 MCG/ACT IN AERS: 1.0000 | INHALATION_SPRAY | Freq: Two times a day (BID) | RESPIRATORY_TRACT | 12 refills | Status: DC

## 2017-03-08 MED ORDER — TRAMADOL HCL 50 MG PO TABS: 50.0000 mg | ORAL_TABLET | Freq: Two times a day (BID) | ORAL | 0 refills | Status: DC

## 2017-03-08 NOTE — Progress Notes (Signed)
Heather [name] is a 76 y.o. female is here to [facility].   Patient Care Team: Briscoe Deutscher, DO as PCP - General (Family Medicine)   History of Present Illness:  Heather [name] CMA acting as scribe for Dr. Juleen China.  HPI: Patient comes in today to establish care. She has several concerns today. She has been living in [place] for 5 years from [place].   Hearing: Patient has decreased hearing. Family members has noticed her speaking more loudly. Will place a referral. Has gradually gotten worse.   Abdominal pain: Patient is having some lower abdominal pain that radiates to the back. Patient has Crhon's disease. No known liver disease. Patient was on Tylenol #3 for pain and family stopped her from this.   Wheezing: Patient has been wheezing since she had pneumonia last fall. She takes albuterol inhaler for this.   Chronic kidney disease: We do not have any labs for the CKD.  We will get her a referral for [facility] to follow the CKD.   Sleep: Patient has had trouble sleeping. Patient used a CPAP machine about 3 years ago and stopped. She stopped taking Tylenol PM for sleep. Family was concerned that was harming the kidneys.   Hip pain: patient has chronic hip pain. Patient stated that she has trouble walking. Discussed getting injections into the hip joint.  Health Maintenance Due  Topic Date Due  . TETANUS/TDAP  01/02/1959  . DEXA SCAN  01/01/2005  . INFLUENZA VACCINE  02/17/2017   Depression screen PHQ 2/9 05/27/2016  Decreased Interest 0  Down, Depressed, Hopeless 0  PHQ - 2 Score 0  Altered sleeping 1  Tired, decreased energy 0  Change in appetite 0  Feeling bad or failure about yourself  0  Trouble concentrating 0  Moving slowly or fidgety/restless 0  Suicidal thoughts 0  PHQ-9 Score 1   PMHx, SurgHx, SocialHx, Medications, and Allergies were reviewed in the Visit Navigator and updated as appropriate.   Past Medical History:  Diagnosis Date  . CHF  (congestive heart failure) ([place])   . Chronic back pain   . Crohn's disease ([place])   . GERD (gastroesophageal reflux disease)   . High cholesterol   . History of echocardiogram    Echo 12/16: EF 60-65%, no RWMA, Gr 2 DD, mild AS (mean 13 mmHg), mild AI, MAC, trivial MS, mild MR, mild LAE, mild RVE, mild to mod TR, PASP 36 mmHg  . Hypertension   . Migraine    "1-2 times/yr" (02/16/2014)  . Murmur, cardiac   . OSA on CPAP   . 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 Pulmonary hypertension ([place])   . Sinus bradycardia 01/29/2014  . Skin cancer 2003   "3 cut off face" (02/16/2014)  . Type II diabetes mellitus ([place])     Past Surgical History:  Procedure Laterality Date  . CARDIAC CATHETERIZATION  1995; 2005  . CATARACT EXTRACTION Bilateral 1982  . ESOPHAGOGASTRODUODENOSCOPY (EGD) WITH PROPOFOL N/A 12/28/2016   Procedure: ESOPHAGOGASTRODUODENOSCOPY (EGD) WITH PROPOFOL;  Surgeon: Otis Brace, MD;  Location: [facility];  Service: Gastroenterology;  Laterality: N/A;  . INSERT / REPLACE / REMOVE PACEMAKER  02/14/2014  . PERMANENT PACEMAKER INSERTION N/A 02/14/2014   Procedure: PERMANENT PACEMAKER INSERTION;  Surgeon: Deboraha Sprang, MD;  [facility];  Service: Cardiovascular;  Laterality: N/A;  . SKIN CANCER EXCISION  2003    "face X 3" (02/16/2014)  . TEMPORARY PACEMAKER INSERTION N/A 02/12/2014   Procedure: TEMPORARY PACEMAKER INSERTION;  Surgeon: Lorretta Harp, MD;  Location: [facility] CATH LAB;  Service: Cardiovascular;  Laterality: N/A;  . VAGINAL HYSTERECTOMY  1972    Family History  Problem Relation Age of Onset  . Adopted: Yes  . Hypertension Mother   . Heart disease Mother    Social History  Substance Use Topics  . Smoking status: Never Smoker  . Smokeless tobacco: Never Used  . Alcohol use No    Current Medications and Allergies:   .  albuterol (PROVENTIL HFA;VENTOLIN HFA) 108 (90 Base) MCG/ACT inhaler, Inhale 2 puffs into the lungs every 6 (six) hours as needed for  wheezing or shortness of breath., Disp: , Rfl:  .  amLODipine (NORVASC) 5 MG tablet, Take 1 tablet (5 mg total) by mouth daily., Disp: 90 tablet, Rfl: 1 .  aspirin 81 MG EC tablet, Take 1 tablet (81 mg total) by mouth daily., Disp: 30 tablet, Rfl: 5 .  carvedilol (COREG) 6.25 MG tablet, TAKE 1 TABLET BY MOUTH 2 TIMES DAILY WITH A MEAL., Disp: 180 tablet, Rfl: 3 .  furosemide (LASIX) 40 MG tablet, Take one tablet (40 mg) by mouth once daily as needed for swelling/ shortness of breath, Disp: , Rfl:  .  hydrochlorothiazide (MICROZIDE) 12.5 MG capsule, Take 12.5 mg by mouth daily., Disp: , Rfl:  .  losartan (COZAAR) 50 MG tablet, Take 1 tablet (50 mg total) by mouth daily., Disp: 90 tablet, Rfl: 3 .  oxybutynin (DITROPAN-XL) 10 MG 24 hr tablet, Take 10 mg by mouth at bedtime., Disp: , Rfl:  .  pantoprazole (PROTONIX) 40 MG tablet, Take 1 tablet (40 mg total) by mouth daily., Disp: 90 tablet, Rfl: 3 .  Polyethyl Glycol-Propyl Glycol (SYSTANE) 0.4-0.3 % SOLN, Apply 2 drops to eye as needed., Disp: 10 mL, Rfl: 0 .  sodium chloride (OCEAN) 0.65 % SOLN nasal spray, Place 1 spray into both nostrils as needed (dry nose)., Disp: 30 mL, Rfl: 0 .  gabapentin (NEURONTIN) 100 MG capsule, Take 1 capsule (100 mg total) by mouth 2 (two) times daily., Disp: 60 capsule, Rfl: 3  No Known Allergies   Review of Systems:   Pertinent items are noted in the HPI. Otherwise, ROS is negative.  Vitals:   Vitals:   03/08/17 0824  BP: (!) 168/88  Pulse: 79  Temp: 98.2 F (36.8 C)  TempSrc: Oral  SpO2: 97%  Weight: 184 lb 12.8 oz (83.8 kg)  Height: _0  (1.575 m)     Body mass index is 33.8 kg/m. Physical Exam:   Physical Exam  Constitutional: She appears well-developed and well-nourished. No distress.  HENT:  Head: Normocephalic and atraumatic.  Eyes: Pupils are equal, round, and reactive to light. EOM are normal.  Neck: Normal range of motion. Neck supple.  Cardiovascular: Normal rate, regular rhythm,  normal heart sounds and intact distal pulses.   Pulmonary/Chest: Effort normal.  Abdominal: Soft.  Skin: Skin is warm.  Psychiatric: She has a normal mood and affect. Her behavior is normal.  Nursing note and vitals reviewed.  Assessment and Plan:   Nyiesha was seen today for establish care.  Diagnoses and all orders for this visit:  Wheezing Comments: CXR without acute changes. Using prn albuterol. Will advanced inhaler to daily inhaled corticosteroid. Orders: -     DG Chest 2 View; Future -     CBC with Differential/Platelet; Future -     Comprehensive metabolic panel; Future -     Magnesium; Future -     beclomethasone (  QVAR) 80 MCG/ACT inhaler; Inhale 1 puff into the lungs 2 (two) times daily.  Lipid screening -     Lipid panel; Future  Bursitis of right hip, unspecified bursa Comments: Will send to Dr. Paulla Fore to evaluate for hip bursa injections.  Orders: -     Ambulatory referral to Sports Medicine -     traMADol (ULTRAM) 50 MG tablet; Take 1 tablet (50 mg total) by mouth 2 (two) times daily.  Decreased hearing of both ears -     Ambulatory referral to ENT  Stage 4 chronic kidney disease ([place]) Comments: Avoid nephrotoxic medications. Orders: -     Comprehensive metabolic panel; Future -     Phosphorus; Future -     Magnesium; Future -     Ambulatory referral to Nephrology  OSA (obstructive sleep apnea) Comments: Will refer to new sleep study and CPAP titration. Orders: -     Nocturnal polysomnography (NPSG); Future  Essential hypertension Comments: Patient will monitor at home. Will increase control if remains elevated.    . Reviewed expectations re: course of current medical issues. . Discussed self-management of symptoms. . Outlined signs and symptoms indicating need for more acute intervention. . Patient verbalized understanding and all questions were answered. Marland Kitchen Health Maintenance issues including appropriate healthy diet, exercise, and smoking  avoidance were discussed with patient. . See orders for this visit as documented in the electronic medical record. . Patient received an After Visit Summary.  CMA served as Education administrator during this visit. History, Physical, and Plan performed by medical provider. The above documentation has been reviewed and is accurate and complete. Briscoe Deutscher, D.O.  Briscoe Deutscher, DO [place] 03/12/2017  Future Appointments Date Time Provider Department Center  04/09/2017 9:20 AM Gerda Diss, DO [facility] None  05/07/2017 8:15 AM Briscoe Deutscher, DO [facility] None

## 2017-03-08 NOTE — Patient Instructions (Signed)
Your chest xray looks okay. I called in QVAR which is a new inhaler that I want you to use twice a day.  For your pain, I want to try Ultram. Do not take it more that twice per day.  Please see Dr. Paulla Fore for hip bursa injections.  We are sending you to ENT for your hearing.   We are sending you to [facility] for your kidneys.

## 2017-03-09 ENCOUNTER — Other Ambulatory Visit (INDEPENDENT_AMBULATORY_CARE_PROVIDER_SITE_OTHER): Payer: Medicare Other

## 2017-03-09 ENCOUNTER — Encounter: Payer: Self-pay | Admitting: Sports Medicine

## 2017-03-09 ENCOUNTER — Telehealth: Payer: Self-pay | Admitting: Family Medicine

## 2017-03-09 ENCOUNTER — Ambulatory Visit (INDEPENDENT_AMBULATORY_CARE_PROVIDER_SITE_OTHER): Payer: Medicare Other | Admitting: Sports Medicine

## 2017-03-09 VITALS — BP 132/82 | HR 71 | Ht 62.0 in | Wt 184.2 lb

## 2017-03-09 DIAGNOSIS — G8929 Other chronic pain: Secondary | ICD-10-CM

## 2017-03-09 DIAGNOSIS — N184 Chronic kidney disease, stage 4 (severe): Secondary | ICD-10-CM

## 2017-03-09 DIAGNOSIS — R5383 Other fatigue: Secondary | ICD-10-CM

## 2017-03-09 DIAGNOSIS — Z950 Presence of cardiac pacemaker: Secondary | ICD-10-CM

## 2017-03-09 DIAGNOSIS — Z13220 Encounter for screening for lipoid disorders: Secondary | ICD-10-CM | POA: Diagnosis not present

## 2017-03-09 DIAGNOSIS — M48062 Spinal stenosis, lumbar region with neurogenic claudication: Secondary | ICD-10-CM | POA: Diagnosis not present

## 2017-03-09 DIAGNOSIS — M25552 Pain in left hip: Secondary | ICD-10-CM | POA: Diagnosis not present

## 2017-03-09 DIAGNOSIS — M25551 Pain in right hip: Secondary | ICD-10-CM

## 2017-03-09 DIAGNOSIS — M25511 Pain in right shoulder: Secondary | ICD-10-CM

## 2017-03-09 LAB — CBC WITH DIFFERENTIAL/PLATELET
Basophils Absolute: 0.1 10*3/uL (ref 0.0–0.1)
Basophils Relative: 2 % (ref 0.0–3.0)
Eosinophils Absolute: 0.1 10*3/uL (ref 0.0–0.7)
Eosinophils Relative: 2 % (ref 0.0–5.0)
HCT: 44.8 % (ref 36.0–46.0)
Hemoglobin: 14.5 g/dL (ref 12.0–15.0)
Lymphocytes Relative: 24.9 % (ref 12.0–46.0)
Lymphs Abs: 1.8 10*3/uL (ref 0.7–4.0)
MCHC: 32.3 g/dL (ref 30.0–36.0)
MCV: 91.4 fl (ref 78.0–100.0)
Monocytes Absolute: 0.7 10*3/uL (ref 0.1–1.0)
Monocytes Relative: 9.4 % (ref 3.0–12.0)
Neutro Abs: 4.4 10*3/uL (ref 1.4–7.7)
Neutrophils Relative %: 61.7 % (ref 43.0–77.0)
Platelets: 229 10*3/uL (ref 150.0–400.0)
RBC: 4.9 Mil/uL (ref 3.87–5.11)
RDW: 14.9 % (ref 11.5–15.5)
WBC: 7.1 10*3/uL (ref 4.0–10.5)

## 2017-03-09 LAB — MAGNESIUM: Magnesium: 2.2 mg/dL (ref 1.5–2.5)

## 2017-03-09 LAB — COMPREHENSIVE METABOLIC PANEL
ALT: 12 U/L (ref 0–35)
AST: 17 U/L (ref 0–37)
Albumin: 3.7 g/dL (ref 3.5–5.2)
Alkaline Phosphatase: 107 U/L (ref 39–117)
BUN: 22 mg/dL (ref 6–23)
CO2: 33 mEq/L — ABNORMAL HIGH (ref 19–32)
Calcium: 9.4 mg/dL (ref 8.4–10.5)
Chloride: 103 mEq/L (ref 96–112)
Creatinine, Ser: 1.29 mg/dL — ABNORMAL HIGH (ref 0.40–1.20)
GFR: 42.57 mL/min — ABNORMAL LOW (ref >60.00)
Glucose, Bld: 103 mg/dL — ABNORMAL HIGH (ref 70–99)
Potassium: 4.9 mEq/L (ref 3.5–5.1)
Sodium: 140 mEq/L (ref 135–145)
Total Bilirubin: 0.6 mg/dL (ref 0.2–1.2)
Total Protein: 8 g/dL (ref 6.0–8.3)

## 2017-03-09 LAB — LIPID PANEL
Cholesterol: 191 mg/dL (ref 0–200)
HDL: 52.1 mg/dL (ref >39.00)
LDL Cholesterol: 122 mg/dL — ABNORMAL HIGH (ref 0–99)
NonHDL: 139.25
Total CHOL/HDL Ratio: 4
Triglycerides: 88 mg/dL (ref 0.0–149.0)
VLDL: 17.6 mg/dL (ref 0.0–40.0)

## 2017-03-09 LAB — PHOSPHORUS: Phosphorus: 3.5 mg/dL (ref 2.3–4.6)

## 2017-03-09 LAB — SEDIMENTATION RATE: Sed Rate: 23 mm/hr (ref 0–30)

## 2017-03-09 MED ORDER — GABAPENTIN 100 MG PO CAPS: 100.0000 mg | ORAL_CAPSULE | Freq: Two times a day (BID) | ORAL | 3 refills | Status: DC

## 2017-03-09 MED ORDER — PREDNISONE 20 MG PO TABS: ORAL_TABLET | ORAL | 0 refills | Status: DC

## 2017-03-09 NOTE — Assessment & Plan Note (Signed)
Patient is fairly debilitated due to the pain in her back and right leg.  She has pain over the gluteal muscles but is more markedly tender over the greater sciatic notch as well as within the popliteal fossa suggesting neural tension signs are the bigger issue here.  Given her underlying CKD she is not a candidate for NSAID therapy.  However given the severe significant symptoms associated with her pain a low dose steroid taper will be sent in for her to see if we can get her more ambulatory.  We will also refer her to physical therapy and start a very low dose of gabapentin, renally dosed.  4-week follow-up to ensure improvement.  If any lack of improvement can consider L5/S1 ESI with Dr. Ernestina Patches but will hold off on this at this time.  Of note she cannot have an MRI and from a surgical standpoint would need a multilevel instrumented fusion.  She has seen Dr. Lorin Mercy at [facility]

## 2017-03-09 NOTE — Telephone Encounter (Signed)
Spoke with patient and she said that the pharmacy does not have the QVAR. They do not make the QVAR 8 mg any more. What would you like to prescribe instead.

## 2017-03-09 NOTE — Telephone Encounter (Signed)
Patient and her daughter here, says the pharm does not have hip medication Dr. Juleen China prescribed.  Please call to clarify. May have to call in something else?  Thank you,  -LL

## 2017-03-09 NOTE — Telephone Encounter (Signed)
Left message for patient to return call. Prescription was given to the patient yesterday. She has a paper copy.

## 2017-03-09 NOTE — Assessment & Plan Note (Signed)
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  Given the systemic steroids injection therapy not indicated at this time.  We will plan to see how Heather [name] responds to this but will also add an ESR to the labs that Heather [name] had drawn earlier today.  Low likelihood of PMR but given hip and shoulder girdle pain and discomfort can keep this in the differential.

## 2017-03-09 NOTE — Progress Notes (Signed)
OFFICE VISIT NOTE Heather [name]. Heather [name], [facility] - 76 y.o. female MRN [id number]  Date of birth: September 27, 1940  Visit Date: 03/09/2017  PCP: Briscoe Deutscher, DO   Referred by: Briscoe Deutscher, DO  Burlene Arnt, CMA acting as scribe for Dr. Paulla Fore.  SUBJECTIVE:   Chief Complaint  Patient presents with  . New Patient (Initial Visit)    hip pain   HPI: As below and per problem based documentation when appropriate.  Heather [name] is a new patient referred by Dr. Briscoe Deutscher. She is presenting today with complaint of hip pain.  Pain started several years ago. She had xray l-spine and bilateral hip 05/20/2016.  Pain is described as constant aching with radiating pain into the legs. Pain is rated about 6/10.  Pain is worse when walking and going up or down stairs. Minimal pain when sitting but pain does increase when doing from sitting to standing. She has some pain when laying down at night, some pain when laying on either side.  Pain improves when at rest.  She started taking Tramadol yesterday and got minimal relief. She has tried apply heat and ice to the hips with no relief.   Associated pains include groin and lower back pain.     Review of Systems  Constitutional: Negative for chills and fever.  Respiratory: Positive for wheezing. Negative for shortness of breath.   Cardiovascular: Positive for chest pain. Negative for palpitations and leg swelling.  Gastrointestinal: Negative.   Genitourinary: Negative.   Musculoskeletal: Positive for back pain and joint pain.  Neurological: Positive for tingling. Negative for dizziness and headaches.  Endo/Heme/Allergies: Does not bruise/bleed easily.    Otherwise per HPI.  HISTORY & PERTINENT PRIOR DATA:  No specialty comments available. She reports that she has never smoked. She has never used smokeless tobacco. No results for input(s): HGBA1C, LABURIC  in the last 8760 hours. Medications & Allergies reviewed per EMR Patient Active Problem List   Diagnosis Date Noted  . Scoliosis of lumbar spine 06/02/2016  . Complete tear of right rotator cuff 06/02/2016  . Neurogenic claudication due to lumbar spinal stenosis 05/06/2016  . Right shoulder pain 05/06/2016  . Loss of hair 11/21/2015  . Postmenopausal atrophic vaginitis 09/16/2015  . CKD (chronic kidney disease) stage 4, GFR 15-29 ml/min (HCC) 05/06/2015  . Bilateral hip pain 05/06/2015  . Seborrheic keratoses 05/06/2015  . 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 Crohn's disease ([place]) 02/16/2014  . Prediabetes 01/29/2014  . OSA (obstructive sleep apnea) 01/29/2014  . Pulmonary hypertension ([place]) 01/29/2014  . CAD (coronary artery disease) 01/29/2014  . Hypertension 01/29/2014  . Diastolic CHF ([place]) 94/17/4081   Past Medical History:  Diagnosis Date  . CHF (congestive heart failure) ([place])   . Chronic back pain   . Crohn's disease ([place])   . GERD (gastroesophageal reflux disease)   . High cholesterol   . History of echocardiogram    Echo 12/16: EF 60-65%, no RWMA, Gr 2 DD, mild AS (mean 13 mmHg), mild AI, MAC, trivial MS, mild MR, mild LAE, mild RVE, mild to mod TR, PASP 36 mmHg  . Hypertension   . Migraine    "1-2 times/yr" (02/16/2014)  . Murmur, cardiac   . OSA on CPAP   . 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 Pulmonary hypertension ([place])   . Sinus bradycardia 01/29/2014  . Skin cancer 2003   "3 cut off face" (02/16/2014)  .  Type II diabetes mellitus (HCC)    Family History  Problem Relation Age of Onset  . Adopted: Yes  . Hypertension Mother   . Heart disease Mother    Past Surgical History:  Procedure Laterality Date  . CARDIAC CATHETERIZATION  1995; 2005  . CATARACT EXTRACTION Bilateral 1982  . ESOPHAGOGASTRODUODENOSCOPY (EGD) WITH PROPOFOL N/A 12/28/2016   Procedure: ESOPHAGOGASTRODUODENOSCOPY (EGD) WITH PROPOFOL;  Surgeon: Otis Brace, MD;  Location: [facility];   Service: Gastroenterology;  Laterality: N/A;  . INSERT / REPLACE / REMOVE PACEMAKER  02/14/2014  . PERMANENT PACEMAKER INSERTION N/A 02/14/2014   Procedure: PERMANENT PACEMAKER INSERTION;  Surgeon: Deboraha Sprang, MD;  Location[facility] LAB;  Service: Cardiovascular;  Laterality: N/A;  . SKIN CANCER EXCISION  2003    "face X 3" (02/16/2014)  . TEMPORARY PACEMAKER INSERTION N/A 02/12/2014   Procedure: TEMPORARY PACEMAKER INSERTION;  Surgeon: Lorretta Harp, MD;  Location: [facility] CATH LAB;  Service: Cardiovascular;  Laterality: N/A;  . VAGINAL HYSTERECTOMY  1972   Social History   Occupational History  . Not on file.   Social History Main Topics  . Smoking status: Never Smoker  . Smokeless tobacco: Never Used  . Alcohol use No  . Drug use: No  . Sexual activity: No    OBJECTIVE:  VS:  HT:5' 2"  (157.5 cm)   WT:184 lb 3.2 oz (83.6 kg)  BMI:33.68    BP:132/82  HR:71bpm  TEMP: ( )  RESP:95 % EXAM: Findings:  WDWN, NAD, Non-toxic appearing Alert & appropriately interactive Not depressed or anxious appearing No increased work of breathing. Pupils are equal. EOM intact without nystagmus No clubbing or cyanosis of the extremities appreciated No significant rashes/lesions/ulcerations overlying the examined area. DP & PT pulses 2+/4.  Trace pretibial edema No clubbing or cyanosis Lower extremity sensation is intact light touch but she describes generalized dysesthesia  Back & Lower Extremities: Marked pain with bilateral straight leg raise.   most focal pain is with popliteal compression test and greater sciatic notch palpation.  She has discomfort with internal and external rotation of bilateral hips.  Her lower extremity reflexes are 1+/4 at the knees and trace at the Achilles.  No clonus.  Manual muscle testing reveals strength that is intact however marked weakness diffusely.  Mild pain with palpation of bilateral greater trochanters with more focally over the gluteal musculature and  was again most focally over the greater sciatic notch. No focal midline tenderness, generalized bilateral facet pain..      X-rays and office visit notes from late 2017 with Dr. Inda Merlin reviewed that show significant multilevel degenerative changes consistent with cervical spondylosis, scoliosis and a marked degenerative shift.  At that time surgical intervention was discussed but encouraged to be avoided due to her other medical complications.  She is not a candidate for MRI.  ASSESSMENT & PLAN:     ICD-10-CM   1. Bilateral hip pain M25.551 Ambulatory referral to Physical Therapy   M25.552 Sedimentation rate  2. Neurogenic claudication due to lumbar spinal stenosis M48.062   3. CKD (chronic kidney disease) stage 4, GFR 15-29 ml/min (HCC) N18.4 Sedimentation rate  4. Chronic right shoulder pain M25.511 Sedimentation rate   G89.29   5. Pacemaker-Medtronic Z95.0   ================================================================= Neurogenic claudication due to lumbar spinal stenosis Patient is fairly debilitated due to the pain in her back and right leg.  She has pain over the gluteal muscles but is more markedly tender over the greater sciatic notch as well  as within the popliteal fossa suggesting neural tension signs are the bigger issue here.  Given her underlying CKD she is not a candidate for NSAID therapy.  However given the severe significant symptoms associated with her pain a low dose steroid taper will be sent in for her to see if we can get her more ambulatory.  We will also refer her to physical therapy and start a very low dose of gabapentin, renally dosed.  4-week follow-up to ensure improvement.  If any lack of improvement can consider L5/S1 ESI with Dr. Ernestina Patches but will hold off on this at this time.  Of note she cannot have an MRI and from a surgical standpoint would need a multilevel instrumented fusion.  She has seen Dr. 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  Given the systemic steroids injection therapy not indicated at this time.  We will plan to see how she responds to this but will also add an ESR to the labs that she had drawn earlier today.  Low likelihood of PMR but given hip and shoulder girdle pain and discomfort can keep this in the differential. =================================================================  Follow-up: Return in about 4 weeks (around 04/06/2017).   CMA/ATC served as Education administrator during this visit. History, Physical, and Plan performed by medical provider. Documentation and orders reviewed and attested to.      Teresa Coombs, [facility] Physician

## 2017-03-10 ENCOUNTER — Telehealth: Payer: Self-pay

## 2017-03-10 ENCOUNTER — Encounter: Payer: Self-pay | Admitting: Cardiology

## 2017-03-10 MED ORDER — BUDESONIDE 180 MCG/ACT IN AEPB: 1.0000 | INHALATION_SPRAY | Freq: Two times a day (BID) | RESPIRATORY_TRACT | 1 refills | Status: DC

## 2017-03-10 NOTE — Telephone Encounter (Signed)
Left message on daughters cell that we have sent in different inhaler. If they have any questions to give [place] a call.

## 2017-03-10 NOTE — Addendum Note (Signed)
Addended by: Durwin Glaze on: 03/10/2017 10:01 AM   Modules accepted: Orders

## 2017-03-10 NOTE — Telephone Encounter (Signed)
Pharmacy requiring PA for Pulmicort because it is not on patient's formulary with Medicaid.  Flovent is on patient's formulary.  Ok to send in?

## 2017-03-10 NOTE — Telephone Encounter (Signed)
Sent in Pulmicort.

## 2017-03-11 NOTE — Telephone Encounter (Signed)
Patient's daughter walked in to inquire about voicemail.  Advised we are waiting on approval from Dr. Juleen China to call in [place].  Please call back daughter to update.  Ty,  -LL

## 2017-03-12 MED ORDER — FLUTICASONE PROPIONATE HFA 220 MCG/ACT IN AERO: 1.0000 | INHALATION_SPRAY | Freq: Two times a day (BID) | RESPIRATORY_TRACT | 12 refills | Status: DC

## 2017-03-12 NOTE — Telephone Encounter (Signed)
Ok to send in [place]?

## 2017-03-12 NOTE — Addendum Note (Signed)
Addended by: Briscoe Deutscher R on: 03/12/2017 10:43 AM   Modules accepted: Orders

## 2017-03-16 ENCOUNTER — Ambulatory Visit (INDEPENDENT_AMBULATORY_CARE_PROVIDER_SITE_OTHER): Payer: Medicare Other | Admitting: *Deleted

## 2017-03-16 DIAGNOSIS — I495 Sick sinus syndrome: Secondary | ICD-10-CM | POA: Diagnosis not present

## 2017-03-16 NOTE — Progress Notes (Signed)
Remote pacemaker transmission.   

## 2017-03-18 ENCOUNTER — Telehealth: Payer: Self-pay | Admitting: Family Medicine

## 2017-03-18 NOTE — Telephone Encounter (Signed)
[facility] called stating that patient has an appointment with them on September 4th at 10:30 am for a hearing evaluation.

## 2017-03-19 LAB — CUP PACEART REMOTE DEVICE CHECK
Battery Remaining Longevity: 83 mo
Battery Voltage: 3.01 V
Brady Statistic AP VP Percent: 0.03 %
Brady Statistic AP VS Percent: 90.21 %
Brady Statistic AS VP Percent: 0.01 %
Brady Statistic AS VS Percent: 9.75 %
Brady Statistic RA Percent Paced: 90.06 %
Brady Statistic RV Percent Paced: 0.04 %
Date Time Interrogation Session: 20180828131734
Implantable Lead Implant Date: 20150729
Implantable Lead Implant Date: 20150729
Implantable Lead Location: 753859
Implantable Lead Location: 753860
Implantable Lead Model: 5076
Implantable Lead Model: 5076
Implantable Pulse Generator Implant Date: 20150729
Lead Channel Impedance Value: 399 Ohm
Lead Channel Impedance Value: 513 Ohm
Lead Channel Impedance Value: 532 Ohm
Lead Channel Impedance Value: 570 Ohm
Lead Channel Pacing Threshold Amplitude: 0.75 V
Lead Channel Pacing Threshold Amplitude: 1 V
Lead Channel Pacing Threshold Pulse Width: 0.4 ms
Lead Channel Pacing Threshold Pulse Width: 0.4 ms
Lead Channel Sensing Intrinsic Amplitude: 11.75 mV
Lead Channel Sensing Intrinsic Amplitude: 11.75 mV
Lead Channel Sensing Intrinsic Amplitude: 2.125 mV
Lead Channel Sensing Intrinsic Amplitude: 2.125 mV
Lead Channel Setting Pacing Amplitude: 2 V
Lead Channel Setting Pacing Amplitude: 2 V
Lead Channel Setting Pacing Pulse Width: 0.4 ms
Lead Channel Setting Sensing Sensitivity: 2 mV

## 2017-03-23 NOTE — Progress Notes (Signed)
Cardiology Office Note Date:  03/24/2017  Patient ID:  Heather [name], Heather [name] 1940-12-15, MRN [id number] PCP:  Briscoe Deutscher, DO  Cardiologist:  Dr. Marlou Porch Electrophysiologist; Dr. Caryl Comes   Chief Complaint: planned f/u  History of Present Illness: Heather [name] is a 76 y.o. female with history of sinus node dysfunction w/PPM, HTN, HLD, OSA/CPAP, p.HTN, DM, chronic CHF (diastolic), spinal stenosis, chronic back/hip pain.  She comes in to be seen for Dr. Caryl Comes, last seen by him in Jan 2018.  At that visit felt to have fluid OL, her HCTZ (she was not taking fearful of renal issues), stopped for PRN lasix.  The patient is seen using translation service, LIL # [id number]. The patient is accompanied by her daughter as well who speaks [place] as well.  She feels like she is doing well. No CP, palpitations, no near syncope or syncope.  No SOB.  She feels like she is doing well from device/pacer perspective.  She has of late felt like sometimes when walking she seems to drift left and will be seeing her PMD soon to discuss this as well as getting CPAP (hers was from [place] and she had to return it several months ago.  She c/o terrible dry throat, worse at night.    Her PMD is managing her lipids/labs   Device information: MDT dual chamber PPM, implanted 02/14/14, dr. Caryl Comes  Past Medical History:  Diagnosis Date  . CHF (congestive heart failure) ([place])   . Chronic back pain   . Crohn's disease ([place])   . GERD (gastroesophageal reflux disease)   . High cholesterol   . History of echocardiogram    Echo 12/16: EF 60-65%, no RWMA, Gr 2 DD, mild AS (mean 13 mmHg), mild AI, MAC, trivial MS, mild MR, mild LAE, mild RVE, mild to mod TR, PASP 36 mmHg  . Hypertension   . Migraine    "1-2 times/yr" (02/16/2014)  . Murmur, cardiac   . OSA on CPAP   . 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 Pulmonary hypertension ([place])   . Sinus bradycardia 01/29/2014  . Skin cancer 2003   "3 cut off face" (02/16/2014)  . Type  II diabetes mellitus ([place])     Past Surgical History:  Procedure Laterality Date  . CARDIAC CATHETERIZATION  1995; 2005  . CATARACT EXTRACTION Bilateral 1982  . ESOPHAGOGASTRODUODENOSCOPY (EGD) WITH PROPOFOL N/A 12/28/2016   Procedure: ESOPHAGOGASTRODUODENOSCOPY (EGD) WITH PROPOFOL;  Surgeon: Otis Brace, MD;  Location: [facility];  Service: Gastroenterology;  Laterality: N/A;  . INSERT / REPLACE / REMOVE PACEMAKER  02/14/2014  . PERMANENT PACEMAKER INSERTION N/A 02/14/2014   Procedure: PERMANENT PACEMAKER INSERTION;  Surgeon: Deboraha Sprang, MD;  Location: [facility] CATH LAB;  Service: Cardiovascular;  Laterality: N/A;  . SKIN CANCER EXCISION  2003    "face X 3" (02/16/2014)  . TEMPORARY PACEMAKER INSERTION N/A 02/12/2014   Procedure: TEMPORARY PACEMAKER INSERTION;  Surgeon: Lorretta Harp, MD;  [facility] C[facility] LAB;  Service: Cardiovascular;  Laterality: N/A;  . VAGINAL HYSTERECTOMY  1972    Current Outpatient Prescriptions  Medication Sig Dispense Refill  . albuterol (PROVENTIL HFA;VENTOLIN HFA) 108 (90 Base) MCG/ACT inhaler Inhale 2 puffs into the lungs every 6 (six) hours as needed for wheezing or shortness of breath.    Marland Kitchen amLODipine (NORVASC) 5 MG tablet Take 1 tablet (5 mg total) by mouth daily. 90 tablet 1  . aspirin 81 MG EC tablet Take 1 tablet (81 mg total) by mouth daily. 30 tablet 5  .  carvedilol (COREG) 6.25 MG tablet TAKE 1 TABLET BY MOUTH 2 TIMES DAILY WITH A MEAL. 180 tablet 3  . fluticasone (FLOVENT HFA) 220 MCG/ACT inhaler Inhale 1 puff into the lungs 2 (two) times daily. 1 Inhaler 12  . furosemide (LASIX) 40 MG tablet Take one tablet (40 mg) by mouth once daily as needed for swelling/ shortness of breath    . gabapentin (NEURONTIN) 100 MG capsule Take 1 capsule (100 mg total) by mouth 2 (two) times daily. 60 capsule 3  . hydrochlorothiazide (MICROZIDE) 12.5 MG capsule Take 12.5 mg by mouth daily.    Marland Kitchen losartan (COZAAR) 50 MG tablet Take 1 tablet (50 mg total) by mouth  daily. 90 tablet 3  . oxybutynin (DITROPAN-XL) 10 MG 24 hr tablet Take 10 mg by mouth at bedtime.    . pantoprazole (PROTONIX) 40 MG tablet Take 1 tablet (40 mg total) by mouth daily. 90 tablet 3  . Polyethyl Glycol-Propyl Glycol (SYSTANE) 0.4-0.3 % SOLN Apply 2 drops to eye as needed. 10 mL 0  . predniSONE (DELTASONE) 20 MG tablet Take 2tabs PO QAM x4days, 1tabs PO QAM x4days, 0.5tabs PO QAM x4days 14 tablet 0  . sodium chloride (OCEAN) 0.65 % SOLN nasal spray Place 1 spray into both nostrils as needed (dry nose). 30 mL 0  . sucralfate (CARAFATE) 1 g tablet Take 1 tablet (1 g total) by mouth 4 (four) times daily -  with meals and at bedtime. (Patient taking differently: Take 1 g by mouth 2 (two) times daily. ) 30 tablet 0  . traMADol (ULTRAM) 50 MG tablet Take 1 tablet (50 mg total) by mouth 2 (two) times daily. 60 tablet 0   No current facility-administered medications for this visit.     Allergies:   Patient has no known allergies.   Social History:  The patient  reports that she has never smoked. She has never used smokeless tobacco. She reports that she does not drink alcohol or use drugs.   Family History:  The patient's family history includes Heart disease in her mother; Hypertension in her mother. She was adopted.  ROS:  Please see the history of present illness.   All other systems are reviewed and otherwise negative.   PHYSICAL EXAM:  VS:  BP 122/78   Pulse 70   Ht _0  (1.575 m)   Wt 181 lb (82.1 kg)   LMP  (LMP Unknown)   BMI 33.11 kg/m  BMI: Body mass index is 33.11 kg/m. Well nourished, well developed, in no acute distress  HEENT: normocephalic, atraumatic  Neck: no JVD, carotid bruits or masses Cardiac:  RRR; no significant murmurs, no rubs, or gallops Lungs:  CTA b/l, no wheezing, rhonchi or rales  Abd: soft, nontender, obese MS: no deformity or atrophy Ext: no edema  Skin: warm and dry, no rash Neuro:  No gross deficits appreciated Psych: euthymic mood, full  affect  PPM site is stable, no tethering or discomfort   EKG:  Not done today PPM interrogation done today and reviewed by myself: battery and lead measurements are good.  She had underlying SB in the 40's today.16 monitored VT episodes, all  Look atrial, all are 1 second or less in duration, one appears a very brief AFlutter, the others ST/AT 1:1  04/18/16: TTE Study Conclusions - Left ventricle: The cavity size was normal. There was mild   concentric hypertrophy. Systolic function was normal. The   estimated ejection fraction was in the range of 55% to  60%. Wall   motion was normal; there were no regional wall motion   abnormalities. Doppler parameters are consistent with abnormal   left ventricular relaxation (grade 1 diastolic dysfunction). - Aortic valve: Valve mobility was restricted. There was mild   stenosis. There was mild regurgitation. Valve area (VTI): 1.39   cm^2. Valve area (Vmax): 1.33 cm^2. Valve area (Vmean): 1.26   cm^2. - Mitral valve: Moderately to severely calcified annulus. There was   mild regurgitation. - Left atrium: The atrium was moderately dilated. - Pulmonary arteries: Systolic pressure was mildly increased. PA   peak pressure: 32 mm Hg (S).  Recent Labs: 03/09/2017: ALT 12; BUN 22; Creatinine, Ser 1.29; Hemoglobin 14.5; Magnesium 2.2; Platelets 229.0; Potassium 4.9; Sodium 140  03/09/2017: Cholesterol 191; HDL 52.10; LDL Cholesterol 122; Total CHOL/HDL Ratio 4; Triglycerides 88.0; VLDL 17.6   Estimated Creatinine Clearance: 36.3 mL/min (A) (by C-G formula based on SCr of 1.29 mg/dL (H)).   Wt Readings from Last 3 Encounters:  03/24/17 181 lb (82.1 kg)  03/09/17 184 lb 3.2 oz (83.6 kg)  03/08/17 184 lb 12.8 oz (83.8 kg)     Other studies reviewed: Additional studies/records reviewed today include: summarized above  ASSESSMENT AND PLAN:  1. PPM     Intact function, no changes made  2. Chronic CHF (diastolic) HFpEF     Exam appears euvolemic,  weight is stable (down from prior)  3. HTN     Looks good, no changes    Disposition: F/u with her PMD regarding her gait and OSA treatment, we will continue her Q 3 month remotes and in-clinic visit in 6 months, sooner if needed  Current medicines are reviewed at length with the patient today.  The patient did not have any concerns regarding medicines.  Venetia Night, PA-C 03/24/2017 5:48 PM     [place] [address] [phone] (office)  [phone] (fax)

## 2017-03-24 ENCOUNTER — Ambulatory Visit (INDEPENDENT_AMBULATORY_CARE_PROVIDER_SITE_OTHER): Payer: Medicare Other | Admitting: Physician Assistant

## 2017-03-24 VITALS — BP 122/78 | HR 70 | Ht 62.0 in | Wt 181.0 lb

## 2017-03-24 DIAGNOSIS — Z950 Presence of cardiac pacemaker: Secondary | ICD-10-CM

## 2017-03-24 DIAGNOSIS — I10 Essential (primary) hypertension: Secondary | ICD-10-CM | POA: Diagnosis not present

## 2017-03-24 DIAGNOSIS — I5032 Chronic diastolic (congestive) heart failure: Secondary | ICD-10-CM

## 2017-03-24 DIAGNOSIS — I5030 Unspecified diastolic (congestive) heart failure: Secondary | ICD-10-CM

## 2017-03-24 NOTE — Patient Instructions (Signed)
Medication Instructions:   Your physician recommends that you continue on your current medications as directed. Please refer to the Current Medication list given to you today.    If you need a refill on your cardiac medications before your next appointment, please call your pharmacy.  Labwork: NONE ORDERED  TODAY    Testing/Procedures: Your physician recommends that you continue on your current medications as directed. Please refer to the Current Medication list given to you today.   Follow-Up: Your physician wants you to follow-up in:  [place] will receive a reminder letter in the mail two months in advance. If you don't receive a letter, please call our office to schedule the follow-up appointment.   Remote monitoring is used to monitor your Pacemaker of ICD from home. This monitoring reduces the number of office visits required to check your device to one time per year. It allows [place] to keep an eye on the functioning of your device to ensure it is working properly. You are scheduled for a device check from home on . 06-15-17 You may send your transmission at any time that day. If you have a wireless device, the transmission will be sent automatically. After your physician reviews your transmission, you will receive a postcard with your next transmission date.      Any Other Special Instructions Will Be Listed Below (If Applicable).

## 2017-03-26 ENCOUNTER — Encounter: Payer: Self-pay | Admitting: Cardiology

## 2017-03-31 DIAGNOSIS — K219 Gastro-esophageal reflux disease without esophagitis: Secondary | ICD-10-CM | POA: Diagnosis not present

## 2017-03-31 DIAGNOSIS — A048 Other specified bacterial intestinal infections: Secondary | ICD-10-CM | POA: Diagnosis not present

## 2017-03-31 DIAGNOSIS — Z1211 Encounter for screening for malignant neoplasm of colon: Secondary | ICD-10-CM | POA: Diagnosis not present

## 2017-03-31 DIAGNOSIS — K3189 Other diseases of stomach and duodenum: Secondary | ICD-10-CM | POA: Diagnosis not present

## 2017-04-02 ENCOUNTER — Encounter: Payer: Medicare Other | Admitting: Nurse Practitioner

## 2017-04-09 ENCOUNTER — Encounter: Payer: Self-pay | Admitting: Sports Medicine

## 2017-04-09 ENCOUNTER — Ambulatory Visit (INDEPENDENT_AMBULATORY_CARE_PROVIDER_SITE_OTHER): Payer: Medicare Other | Admitting: Sports Medicine

## 2017-04-09 VITALS — BP 104/62 | HR 71 | Ht 62.0 in | Wt 184.8 lb

## 2017-04-09 DIAGNOSIS — G8929 Other chronic pain: Secondary | ICD-10-CM | POA: Diagnosis not present

## 2017-04-09 DIAGNOSIS — M25551 Pain in right hip: Secondary | ICD-10-CM

## 2017-04-09 DIAGNOSIS — M75121 Complete rotator cuff tear or rupture of right shoulder, not specified as traumatic: Secondary | ICD-10-CM

## 2017-04-09 DIAGNOSIS — M48062 Spinal stenosis, lumbar region with neurogenic claudication: Secondary | ICD-10-CM | POA: Diagnosis not present

## 2017-04-09 DIAGNOSIS — N184 Chronic kidney disease, stage 4 (severe): Secondary | ICD-10-CM | POA: Diagnosis not present

## 2017-04-09 DIAGNOSIS — M25511 Pain in right shoulder: Secondary | ICD-10-CM | POA: Diagnosis not present

## 2017-04-09 DIAGNOSIS — M25552 Pain in left hip: Secondary | ICD-10-CM | POA: Diagnosis not present

## 2017-04-09 DIAGNOSIS — G4733 Obstructive sleep apnea (adult) (pediatric): Secondary | ICD-10-CM | POA: Diagnosis not present

## 2017-04-09 NOTE — Assessment & Plan Note (Signed)
ESR previously essentially normal but did have marked response to systemic corticosteroids.  Her nighttime symptoms are minimal and she typically has pain that is worse during the day and morning.  Pain is worse with ambulation suggesting more of a spinal stenosis picture but could consider polymyalgia rheumatica.  If lack of improvement with targeted L5-S1 ESI can consider low-dose systemic corticosteroids long-term but this will need to be discussed in detail prior to initiation.

## 2017-04-09 NOTE — Assessment & Plan Note (Signed)
Poor sleep at baseline due to underlying OSA but improved on gabapentin.  Okay to titrate up to 2 pills nightly +1 every morning.

## 2017-04-09 NOTE — Progress Notes (Signed)
OFFICE VISIT NOTE Heather [name]. Rigby, Heather [name] - 76 y.o. female MRN [id number]  Date of birth: 1941/03/29  Visit Date: 04/09/2017  PCP: Briscoe Deutscher, DO   Referred by: Briscoe Deutscher, DO  Burlene Arnt, CMA acting as scribe for Dr. Paulla Fore.  SUBJECTIVE:   Chief Complaint  Patient presents with  . Follow-up    bilateral hip pain   HPI: As below and per problem based documentation when appropriate.  Heather [name] is an established patient presenting today in follow -up of bilateral hip pain and RT shoulder pain. She was last seen 03/09/2017 and prescribed Prednisone taper and Gabapentin. She was also referred to PT.   Pt reports that she was never contacted by PT so she never started going. Pt reports improvement in pain after starting prednisone taper and Gabapentin but the pain started back about 1 week ago.   Hip pain seems to come and go. Pain does radiate into both legs causing walking to be difficult. She also c/o pain in the groin. She has pain in the lower back and gluteal muscles. Pain is worse when trying to stand after sitting for 10-15 minutes at a time. The pain is also worse while walking.   Shoulder pain seems to be mostly posterior. Pain does radiate into the RT side of the neck. Pain does radiate into the arm with movement. Pain is worse when reaching up. She has tried using [employer] with minimal relief.     Review of Systems  Constitutional: Negative for chills and fever.  Respiratory: Positive for cough. Negative for shortness of breath and wheezing.   Cardiovascular: Negative for chest pain and palpitations.  Musculoskeletal: Positive for back pain and joint pain. Negative for falls.  Neurological: Positive for tremors (RT hand). Negative for dizziness, tingling and headaches.  Endo/Heme/Allergies: Does not bruise/bleed easily.    Otherwise per HPI.  HISTORY & PERTINENT  PRIOR DATA:  No specialty comments available. She reports that she has never smoked. She has never used smokeless tobacco. No results for input(s): HGBA1C, LABURIC in the last 8760 hours. Medications & Allergies reviewed per EMR Patient Active Problem List   Diagnosis Date Noted  . Scoliosis of lumbar spine 06/02/2016  . Complete tear of right rotator cuff 06/02/2016  . Neurogenic claudication due to lumbar spinal stenosis 05/06/2016  . Right shoulder pain 05/06/2016  . Sjogren's syndrome ([place]) 04/27/2016  . Xerostomia 04/27/2016  . Loss of hair 11/21/2015  . Postmenopausal atrophic vaginitis 09/16/2015  . CKD (chronic kidney disease) stage 4, GFR 15-29 ml/min (HCC) 05/06/2015  . Bilateral hip pain 05/06/2015  . Seborrheic keratoses 05/06/2015  . 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 Crohn's disease ([place]) 02/16/2014  . Prediabetes 01/29/2014  . OSA (obstructive sleep apnea) 01/29/2014  . Pulmonary hypertension ([place]) 01/29/2014  . CAD (coronary artery disease) 01/29/2014  . Hypertension 01/29/2014  . Diastolic CHF ([place]) 82/99/3716   Past Medical History:  Diagnosis Date  . CHF (congestive heart failure) ([place])   . Chronic back pain   . Crohn's disease ([place])   . GERD (gastroesophageal reflux disease)   . High cholesterol   . History of echocardiogram    Echo 12/16: EF 60-65%, no RWMA, Gr 2 DD, mild AS (mean 13 mmHg), mild AI, MAC, trivial MS, mild MR, mild LAE, mild RVE, mild to mod TR, PASP 36 mmHg  . Hypertension   . Migraine    "  1-2 times/yr" (02/16/2014)  . Murmur, cardiac   . OSA on CPAP   . 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 Pulmonary hypertension ([place])   . Sinus bradycardia 01/29/2014  . Skin cancer 2003   "3 cut off face" (02/16/2014)  . Type II diabetes mellitus (HCC)    Family History  Problem Relation Age of Onset  . Adopted: Yes  . Hypertension Mother   . Heart disease Mother    Past Surgical History:  Procedure Laterality Date  . CARDIAC CATHETERIZATION  1995;  2005  . CATARACT EXTRACTION Bilateral 1982  . ESOPHAGOGASTRODUODENOSCOPY (EGD) WITH PROPOFOL N/A 12/28/2016   Procedure: ESOPHAGOGASTRODUODENOSCOPY (EGD) WITH PROPOFOL;  Surgeon: Otis Brace, MD;  Location: [place];  Service: Gastroenterology;  Laterality: N/A;  . INSERT / REPLACE / REMOVE PACEMAKER  02/14/2014  . PERMANENT PACEMAKER INSERTION N/A 02/14/2014   Procedure: PERMANENT PACEMAKER INSERTION;  Surgeon: Deboraha Sprang, MD;  [facility] CATH LAB;  Service: Cardiovascular;  Laterality: N/A;  . SKIN CANCER EXCISION  2003    "face X 3" (02/16/2014)  . TEMPORARY PACEMAKER INSERTION N/A 02/12/2014   Procedure: TEMPORARY PACEMAKER INSERTION;  Surgeon: Lorretta Harp, MD;  Location: [facility] CATH LAB;  Service: Cardiovascular;  Laterality: N/A;  . VAGINAL HYSTERECTOMY  1972   Social History   Occupational History  . Not on file.   Social History Main Topics  . Smoking status: Never Smoker  . Smokeless tobacco: Never Used  . Alcohol use No  . Drug use: No  . Sexual activity: No    OBJECTIVE:  VS:  HT:5' 2"  (157.5 cm)   WT:184 lb 12.8 oz (83.8 kg)  BMI:33.79    BP:104/62  HR:71bpm  TEMP: ( )  RESP:95 % EXAM: Findings:  WDWN, NAD, Non-toxic appearing Alert & appropriately interactive Not depressed or anxious appearing No increased work of breathing. Pupils are equal. EOM intact without nystagmus No clubbing or cyanosis of the extremities appreciated No significant rashes/lesions/ulcerations overlying the examined area. Radial pulses 2+/4.  No significant generalized UE edema. DP & PT pulses 2+/4.  No significant pretibial edema.  No clubbing or cyanosis  Right shoulder: Positive Hawkins, internal rotation external rotation strength is intact.  She has some pain with empty can testing but her strength is intact she does not have a positive drop arm test.  She does have marked tenderness to palpation diffusely through the bilateral upper extremities and shoulder girdle.   Overall well-maintained shoulder range of motion but does have some limitations in cervical range of motion.  Negative Spurling's compression test bilaterally.  Negative Lhermitte's compression test.  Back: Markedly tight hamstrings with tightness but no focal radicular symptoms with straight leg raise bilaterally.  She has overall moderate internal and external rotation of her hips without focal pain. She is able to rock onto her toes and hold this position without significant difficulty.     No results found. ASSESSMENT & PLAN:     ICD-10-CM   1. Bilateral hip pain M25.551 Ambulatory referral to Physical Therapy   M25.552 Ambulatory referral to Physical Medicine Rehab  2. Neurogenic claudication due to lumbar spinal stenosis M48.062 Ambulatory referral to Physical Therapy    Ambulatory referral to Physical Medicine Rehab  3. Chronic right shoulder pain M25.511    G89.29   4. Stage 4 chronic kidney disease ([place]) N18.4   5. OSA (obstructive sleep apnea) G47.33   6. Complete tear of right rotator cuff M75.121   ================================================================= OSA (obstructive sleep apnea) Poor sleep  at baseline due to underlying OSA but improved on gabapentin.  Okay to titrate up to 2 pills nightly +1 every morning.  Neurogenic claudication due to lumbar spinal stenosis Symptoms did improve on prednisone and could consider low-dose if any lack of improvement but would like to try a targeted approach with an empiric lumbar L5/S1 ESI with Dr. Ernestina Patches.  Referral placed today.  Right shoulder pain She does have symptoms of shoulder impingement myofascial pain syndrome.  Will benefit from therapeutic exercises and referral reinitiated today to physical therapy in our office.  Bilateral hip pain ESR previously essentially normal but did have marked response to systemic corticosteroids.  Her nighttime symptoms are minimal and she typically has pain that is worse during the day  and morning.  Pain is worse with ambulation suggesting more of a spinal stenosis picture but could consider polymyalgia rheumatica.  If lack of improvement with targeted L5-S1 ESI can consider low-dose systemic corticosteroids long-term but this will need to be discussed in detail prior to initiation. =================================================================   Follow-up: Return in about 8 weeks (around 06/04/2017) for Also schedule PT visit with Lauren.   CMA/ATC served as Education administrator during this visit. History, Physical, and Plan performed by medical provider. Documentation and orders reviewed and attested to.      Teresa Coombs, [place] [facility] Physician

## 2017-04-09 NOTE — Assessment & Plan Note (Signed)
She does have symptoms of shoulder impingement myofascial pain syndrome.  Will benefit from therapeutic exercises and referral reinitiated today to physical therapy in our office.

## 2017-04-09 NOTE — Assessment & Plan Note (Signed)
Symptoms did improve on prednisone and could consider low-dose if any lack of improvement but would like to try a targeted approach with an empiric lumbar L5/S1 ESI with Dr. Ernestina Patches.  Referral placed today.

## 2017-04-12 ENCOUNTER — Telehealth: Payer: Self-pay | Admitting: Family Medicine

## 2017-04-12 ENCOUNTER — Ambulatory Visit: Payer: Medicaid Other

## 2017-04-12 NOTE — Telephone Encounter (Signed)
I have called and spoken to Nelida Meuse (daughter on [place]) explaining that [facility] does not accept patients with [employer] as primary or secondary for PT. I gave Estella the number and address to [facility] to call and get scheduled for PT there. I explained that the patient no longer has an appointment today at [facility] and that it has been cancelled. No further action required at this time.

## 2017-04-13 ENCOUNTER — Ambulatory Visit: Payer: Medicaid Other | Admitting: Physical Therapy

## 2017-04-14 DIAGNOSIS — Z1212 Encounter for screening for malignant neoplasm of rectum: Secondary | ICD-10-CM | POA: Diagnosis not present

## 2017-04-14 DIAGNOSIS — Z1211 Encounter for screening for malignant neoplasm of colon: Secondary | ICD-10-CM | POA: Diagnosis not present

## 2017-04-16 ENCOUNTER — Other Ambulatory Visit: Payer: Self-pay

## 2017-04-16 MED ORDER — HYDROCHLOROTHIAZIDE 12.5 MG PO CAPS: 12.5000 mg | ORAL_CAPSULE | Freq: Every day | ORAL | 11 refills | Status: DC

## 2017-04-26 ENCOUNTER — Ambulatory Visit (INDEPENDENT_AMBULATORY_CARE_PROVIDER_SITE_OTHER): Payer: Medicare Other | Admitting: Family Medicine

## 2017-04-26 ENCOUNTER — Ambulatory Visit (INDEPENDENT_AMBULATORY_CARE_PROVIDER_SITE_OTHER)
Admission: RE | Admit: 2017-04-26 | Discharge: 2017-04-26 | Disposition: A | Discharge status: Home / Self Care | Payer: Medicare Other | Source: Ambulatory Visit | Attending: Family Medicine | Admitting: Family Medicine

## 2017-04-26 VITALS — BP 158/78 | HR 92 | Temp 98.8°F | Ht 62.0 in | Wt 179.4 lb

## 2017-04-26 DIAGNOSIS — H9193 Unspecified hearing loss, bilateral: Secondary | ICD-10-CM

## 2017-04-26 DIAGNOSIS — K5090 Crohn's disease, unspecified, without complications: Secondary | ICD-10-CM | POA: Diagnosis not present

## 2017-04-26 DIAGNOSIS — R5383 Other fatigue: Secondary | ICD-10-CM | POA: Diagnosis not present

## 2017-04-26 DIAGNOSIS — N184 Chronic kidney disease, stage 4 (severe): Secondary | ICD-10-CM

## 2017-04-26 DIAGNOSIS — R2689 Other abnormalities of gait and mobility: Secondary | ICD-10-CM

## 2017-04-26 DIAGNOSIS — M3500 Sicca syndrome, unspecified: Secondary | ICD-10-CM | POA: Diagnosis not present

## 2017-04-26 DIAGNOSIS — I509 Heart failure, unspecified: Secondary | ICD-10-CM | POA: Diagnosis not present

## 2017-04-26 DIAGNOSIS — R609 Edema, unspecified: Secondary | ICD-10-CM

## 2017-04-26 DIAGNOSIS — Z23 Encounter for immunization: Secondary | ICD-10-CM | POA: Diagnosis not present

## 2017-04-26 DIAGNOSIS — R51 Headache: Secondary | ICD-10-CM | POA: Diagnosis not present

## 2017-04-26 DIAGNOSIS — E1122 Type 2 diabetes mellitus with diabetic chronic kidney disease: Secondary | ICD-10-CM

## 2017-04-26 DIAGNOSIS — R39198 Other difficulties with micturition: Secondary | ICD-10-CM | POA: Diagnosis not present

## 2017-04-26 LAB — CBC
HCT: 47.5 % — ABNORMAL HIGH (ref 36.0–46.0)
Hemoglobin: 15.5 g/dL — ABNORMAL HIGH (ref 12.0–15.0)
MCHC: 32.7 g/dL (ref 30.0–36.0)
MCV: 90.3 fl (ref 78.0–100.0)
Platelets: 231 10*3/uL (ref 150.0–400.0)
RBC: 5.27 Mil/uL — ABNORMAL HIGH (ref 3.87–5.11)
RDW: 14.4 % (ref 11.5–15.5)
WBC: 6.7 10*3/uL (ref 4.0–10.5)

## 2017-04-26 LAB — BRAIN NATRIURETIC PEPTIDE: Pro B Natriuretic peptide (BNP): 64 pg/mL (ref 0.0–100.0)

## 2017-04-26 LAB — COMPREHENSIVE METABOLIC PANEL
ALT: 16 U/L (ref 0–35)
AST: 20 U/L (ref 0–37)
Albumin: 4 g/dL (ref 3.5–5.2)
Alkaline Phosphatase: 85 U/L (ref 39–117)
BUN: 23 mg/dL (ref 6–23)
CO2: 32 mEq/L (ref 19–32)
Calcium: 9.6 mg/dL (ref 8.4–10.5)
Chloride: 96 mEq/L (ref 96–112)
Creatinine, Ser: 1.64 mg/dL — ABNORMAL HIGH (ref 0.40–1.20)
GFR: 32.26 mL/min — ABNORMAL LOW (ref >60.00)
Glucose, Bld: 109 mg/dL — ABNORMAL HIGH (ref 70–99)
Potassium: 3.4 mEq/L — ABNORMAL LOW (ref 3.5–5.1)
Sodium: 137 mEq/L (ref 135–145)
Total Bilirubin: 0.5 mg/dL (ref 0.2–1.2)
Total Protein: 8 g/dL (ref 6.0–8.3)

## 2017-04-26 LAB — URINALYSIS, ROUTINE W REFLEX MICROSCOPIC
Bilirubin Urine: NEGATIVE
Hgb urine dipstick: NEGATIVE
Ketones, ur: NEGATIVE
Nitrite: NEGATIVE
RBC / HPF: NONE SEEN (ref 0–?)
Specific Gravity, Urine: 1.015 (ref 1.000–1.030)
Total Protein, Urine: NEGATIVE
Urine Glucose: NEGATIVE
Urobilinogen, UA: 0.2 (ref 0.0–1.0)
pH: 6 (ref 5.0–8.0)

## 2017-04-26 LAB — MAGNESIUM: Magnesium: 2.1 mg/dL (ref 1.5–2.5)

## 2017-04-26 LAB — T4, FREE: Free T4: 1.04 ng/dL (ref 0.60–1.60)

## 2017-04-26 LAB — PHOSPHORUS: Phosphorus: 3.4 mg/dL (ref 2.3–4.6)

## 2017-04-26 LAB — TSH: TSH: 1.39 u[IU]/mL (ref 0.35–4.50)

## 2017-04-26 NOTE — Progress Notes (Signed)
Heather [name] is a 76 y.o. female here for an acute visit.  History of Present Illness:   [name] acting as scribe for Dr. Juleen China.  HPI: Patient comes in today for fatigue and feeling off balance.   Balance: Patient is having some balance problems. She stated that she feels like she is to fall over on her right side. She stated that she has been feeling like this for over 6 months. This is not all the time. Patient seen cardiology not long ago. Patient has not done the sleep study. During neurological testing patient felt a little off balance.  Concern is that the patient may have had a mini stroke. Will order a CT scan today. Will order labs today as well.   Sleep Apnea: We have order a sleep study, but patient has not done the sleep study. She has used a CPAP for 10 years in the past. Spoke with patient that she needed to get back on the CPAP to help with fatigue.   Cough: Patient has been coughing a lot here lately. Patient coughs more at night. Patient has been taking the Lasix. She has been doing the nose spray.   Irritable: Patient has been irritable and arguing with her daughter. Patient had a spell where she thought her mother was choking her. Her mother has been deceased for many years. She does not remember saying any of this. Denies any headache, vision changes, and numbness or tingling in hands. Done a neurological test on patient.    Depression: Patients daughter stated that her mother is more depressed some days. She stated that some days patient stays in bed longer during the day.   PMHx, SurgHx, SocialHx, Medications, and Allergies were reviewed in the Visit Navigator and updated as appropriate.  Current Medications:   .  albuterol (PROVENTIL HFA;VENTOLIN HFA) 108 (90 Base) MCG/ACT inhaler, Inhale 2 puffs into the lungs every 6 (six) hours as needed for wheezing or shortness of breath., Disp: , Rfl:  .  amLODipine (NORVASC) 5 MG tablet, Take 1 tablet (5 mg  total) by mouth daily., Disp: 90 tablet, Rfl: 1 .  aspirin 81 MG EC tablet, Take 1 tablet (81 mg total) by mouth daily., Disp: 30 tablet, Rfl: 5 .  carvedilol (COREG) 6.25 MG tablet, TAKE 1 TABLET BY MOUTH 2 TIMES DAILY WITH A MEAL., Disp: 180 tablet, Rfl: 3 .  fluticasone (FLOVENT HFA) 220 MCG/ACT inhaler, Inhale 1 puff into the lungs 2 (two) times daily., Disp: 1 Inhaler, Rfl: 12 .  furosemide (LASIX) 40 MG tablet, Take one tablet (40 mg) by mouth once daily as needed for swelling/ shortness of breath, Disp: , Rfl:  .  gabapentin (NEURONTIN) 100 MG capsule, Take 1 capsule (100 mg total) by mouth 2 (two) times daily., Disp: 60 capsule, Rfl: 3 .  hydrochlorothiazide (MICROZIDE) 12.5 MG capsule, Take 1 capsule (12.5 mg total) by mouth daily., Disp: 30 capsule, Rfl: 11 .  losartan (COZAAR) 50 MG tablet, Take 1 tablet (50 mg total) by mouth daily., Disp: 90 tablet, Rfl: 3 .  oxybutynin (DITROPAN-XL) 10 MG 24 hr tablet, Take 10 mg by mouth at bedtime., Disp: , Rfl:  .  pantoprazole (PROTONIX) 40 MG tablet, Take 1 tablet (40 mg total) by mouth daily., Disp: 90 tablet, Rfl: 3 .  Polyethyl Glycol-Propyl Glycol (SYSTANE) 0.4-0.3 % SOLN, Apply 2 drops to eye as needed., Disp: 10 mL, Rfl: 0 .  sodium chloride (OCEAN) 0.65 % SOLN nasal spray,  Place 1 spray into both nostrils as needed (dry nose)., Disp: 30 mL, Rfl: 0 .  sucralfate (CARAFATE) 1 g tablet, Take 1 tablet (1 g total) by mouth 4 (four) times daily -  with meals and at bedtime. (Patient taking differently: Take 1 g by mouth 2 (two) times daily. ), Disp: 30 tablet, Rfl: 0 .  traMADol (ULTRAM) 50 MG tablet, Take 1 tablet (50 mg total) by mouth 2 (two) times daily., Disp: 60 tablet, Rfl: 0   No Known Allergies   Review of Systems:   Pertinent items are noted in the HPI. Otherwise, ROS is negative.  Vitals:   Vitals:   04/26/17 0824  BP: (!) 158/78  Pulse: 92  Temp: 98.8 F (37.1 C)  TempSrc: Oral  SpO2: 95%  Weight: 179 lb 6.4 oz (81.4 kg)   Height: 5' 2"  (1.575 m)     Body mass index is 32.81 kg/m.   Physical Exam:   Physical Exam  Constitutional: She appears well-developed and well-nourished. No distress.  HENT:  Head: Normocephalic and atraumatic.  Right Ear: External ear normal.  Left Ear: External ear normal.  Eyes: Pupils are equal, round, and reactive to light. EOM are normal.  Neck: Normal range of motion. Neck supple.  Cardiovascular: Normal rate, regular rhythm, normal heart sounds and intact distal pulses.   Pulmonary/Chest: Effort normal.  Abdominal: Soft.  Skin: Skin is warm.  Psychiatric: She has a normal mood and affect. Her behavior is normal.  Nursing note and vitals reviewed.   Results for orders placed or performed in visit on 04/26/17  Urinalysis, Routine w reflex microscopic  Result Value Ref Range   Color, Urine YELLOW Yellow;Lt. Yellow   APPearance CLEAR Clear   Specific Gravity, Urine 1.015 1.000 - 1.030   pH 6.0 5.0 - 8.0   Total Protein, Urine NEGATIVE Negative   Urine Glucose NEGATIVE Negative   Ketones, ur NEGATIVE Negative   Bilirubin Urine NEGATIVE Negative   Hgb urine dipstick NEGATIVE Negative   Urobilinogen, UA 0.2 0.0 - 1.0   Leukocytes, UA TRACE (A) Negative   Nitrite NEGATIVE Negative   WBC, UA 3-6/hpf (A) 0-2/hpf   RBC / HPF none seen 0-2/hpf   Squamous Epithelial / LPF Many(>10/hpf) (A) Rare(0-4/hpf)   Bacteria, UA Rare(<10/hpf) (A) None   Hyaline Casts, UA Presence of (A) None  TSH  Result Value Ref Range   TSH 1.39 0.35 - 4.50 uIU/mL  T4, free  Result Value Ref Range   Free T4 1.04 0.60 - 1.60 ng/dL  Comprehensive metabolic panel  Result Value Ref Range   Sodium 137 135 - 145 mEq/L   Potassium 3.4 (L) 3.5 - 5.1 mEq/L   Chloride 96 96 - 112 mEq/L   CO2 32 19 - 32 mEq/L   Glucose, Bld 109 (H) 70 - 99 mg/dL   BUN 23 6 - 23 mg/dL   Creatinine, Ser 1.64 (H) 0.40 - 1.20 mg/dL   Total Bilirubin 0.5 0.2 - 1.2 mg/dL   Alkaline Phosphatase 85 39 - 117 U/L   AST  20 0 - 37 U/L   ALT 16 0 - 35 U/L   Total Protein 8.0 6.0 - 8.3 g/dL   Albumin 4.0 3.5 - 5.2 g/dL   Calcium 9.6 8.4 - 10.5 mg/dL   GFR 32.26 (L) >60.00 mL/min  CBC  Result Value Ref Range   WBC 6.7 4.0 - 10.5 K/uL   RBC 5.27 (H) 3.87 - 5.11 Mil/uL   Platelets  231.0 150.0 - 400.0 K/uL   Hemoglobin 15.5 (H) 12.0 - 15.0 g/dL   HCT 47.5 (H) 36.0 - 46.0 %   MCV 90.3 78.0 - 100.0 fl   MCHC 32.7 30.0 - 36.0 g/dL   RDW 14.4 11.5 - 15.5 %  Magnesium  Result Value Ref Range   Magnesium 2.1 1.5 - 2.5 mg/dL  Phosphorus  Result Value Ref Range   Phosphorus 3.4 2.3 - 4.6 mg/dL  Brain natriuretic peptide  Result Value Ref Range   Pro B Natriuretic peptide (BNP) 64.0 0.0 - 100.0 pg/mL   Assessment and Plan:   Jodiann was seen today for fatigue.  Diagnoses and all orders for this visit:  Imbalance Comments: CT today without acute issues. Will ask Neurology to evaluate. Offered PT as well.   Orders: -     Urinalysis, Routine w reflex microscopic -     TSH -     T4, free -     Comprehensive metabolic panel -     CBC -     Magnesium -     Phosphorus -     Brain natriuretic peptide -     CT Head Wo Contrast; Future  Fatigue, unspecified type Comments: Orders as below. Depression is likely contributing to this issue. Orders: -     Urinalysis, Routine w reflex microscopic -     TSH -     T4, free -     Comprehensive metabolic panel -     CBC -     Magnesium -     Phosphorus -     Brain natriuretic peptide -     CT Head Wo Contrast; Future  Subjective change in urination Comments: Difficulty with urination likely due to Ditropan.  Orders: -     Urinalysis, Routine w reflex microscopic  Edema, unspecified type Comments: Stable.  Orders: -     Brain natriuretic peptide -     CT Head Wo Contrast; Future  Congestive heart failure, unspecified HF chronicity, unspecified heart failure type [facility]) Comments:  ProBNP    Component Value Date/Time   PROBNP 64.0 04/26/2017  0916   Orders: -     Brain natriuretic peptide  Decreased hearing of both ears -     Ambulatory referral to ENT  Encounter for immunization -     Flu vaccine HIGH DOSE PF  Controlled type 2 diabetes mellitus with chronic kidney disease, without long-term current use of insulin, unspecified CKD stage ([place]) Comments:  Lab Results  Component Value Date   HGBA1C 5.7 11/21/2015   Crohn's disease without complication, unspecified gastrointestinal tract location ([place])  Sjogren's syndrome, with unspecified organ involvement (HCC)  CKD (chronic kidney disease) stage 4, GFR 15-29 ml/min ([place]) Comments:  Lab Results  Component Value Date   CREATININE 1.64 (H) 04/26/2017   CREATININE 1.29 (H) 03/09/2017   CREATININE 1.63 (H) 11/25/2016    . Reviewed expectations re: course of current medical issues. . Discussed self-management of symptoms. . Outlined signs and symptoms indicating need for more acute intervention. . Patient verbalized understanding and all questions were answered. Marland Kitchen Health Maintenance issues including appropriate healthy diet, exercise, and smoking avoidance were discussed with patient. . See orders for this visit as documented in the electronic medical record. . Patient received an After Visit Summary.  CMA served as Education administrator during this visit. History, Physical, and Plan performed by medical provider. The above documentation has been reviewed and is accurate and complete. Briscoe Deutscher, D.O.  Briscoe Deutscher, DO [place] [place] 04/27/2017  Future Appointments Date Time Provider [place]  04/29/2017 1:30 PM Magnus Sinning, MD [facility] None  05/07/2017 8:15 AM Briscoe Deutscher, DO [facility] None  06/04/2017 9:20 AM Gerda Diss, DO [facility] None  06/15/2017 10:45 AM [facility] DEVICE REMOTES [facility]  06/28/2017 9:15 AM Briscoe Deutscher, DO [facility] None

## 2017-04-27 DIAGNOSIS — E1122 Type 2 diabetes mellitus with diabetic chronic kidney disease: Secondary | ICD-10-CM | POA: Insufficient documentation

## 2017-04-28 ENCOUNTER — Other Ambulatory Visit: Payer: Self-pay | Admitting: Surgical

## 2017-04-28 DIAGNOSIS — R2689 Other abnormalities of gait and mobility: Secondary | ICD-10-CM

## 2017-04-29 ENCOUNTER — Ambulatory Visit (INDEPENDENT_AMBULATORY_CARE_PROVIDER_SITE_OTHER): Payer: Medicare Other | Admitting: Physical Medicine and Rehabilitation

## 2017-04-29 ENCOUNTER — Encounter (INDEPENDENT_AMBULATORY_CARE_PROVIDER_SITE_OTHER): Payer: Self-pay | Admitting: Physical Medicine and Rehabilitation

## 2017-04-29 ENCOUNTER — Ambulatory Visit (INDEPENDENT_AMBULATORY_CARE_PROVIDER_SITE_OTHER): Payer: Medicaid Other

## 2017-04-29 VITALS — BP 104/53 | HR 77 | Temp 98.2°F

## 2017-04-29 DIAGNOSIS — M5416 Radiculopathy, lumbar region: Secondary | ICD-10-CM

## 2017-04-29 MED ORDER — BETAMETHASONE SOD PHOS & ACET 6 (3-3) MG/ML IJ SUSP
12.0000 mg | Freq: Once | INTRAMUSCULAR | Status: AC
Start: 2017-04-29 — End: 2017-04-29
  Administered 2017-04-29: 12 mg

## 2017-04-29 MED ORDER — LIDOCAINE HCL (PF) 1 % IJ SOLN
2.0000 mL | Freq: Once | INTRAMUSCULAR | Status: AC
  Administered 2017-04-29: 2 mL

## 2017-04-29 NOTE — Progress Notes (Deleted)
Pt here for ESI of lumbar spine, pt is on aspirin and has taken it today aroiund 10 am, no allergy to contrast. Pain today, pain scale 6, hurts worse on right side, radiates down to legs hurts when she walks, and does have numbness, patient taking gabapentin and tramadol for pain which was prescribed by Dr. Paulla Fore.

## 2017-04-29 NOTE — Patient Instructions (Signed)

## 2017-05-06 NOTE — Procedures (Signed)
Heather [name] is a 76 year old female followed by Dr. Paulla Fore. She is having radicular type leg pain on the right side with paresthesia. This is chronic and worsening. She has failed conservative care including time and medication management including gabapentin and tramadol. Dr. Paulla Fore request diagnostic and hopefully therapeutic right L5-S1 intralaminar epidural steroid injection. Depending on outcome would likely look at MRI of the lumbar spine. She has no red flag symptoms.  Lumbar Epidural Steroid Injection - Interlaminar Approach with Fluoroscopic Guidance  Patient: Heather [name]      Date of Birth: 05/18/1941 MRN: [id number] PCP: Briscoe Deutscher, DO      Visit Date: 04/29/2017   Universal Protocol:     Consent Given By: the patient  Position: PRONE  Additional Comments: Vital signs were monitored before and after the procedure. Patient was prepped and draped in the usual sterile fashion. The correct patient, procedure, and site was verified.   Injection Procedure Details:  Procedure Site One Meds Administered:  Meds ordered this encounter  Medications  . lidocaine (PF) (XYLOCAINE) 1 % injection 2 mL  . betamethasone acetate-betamethasone sodium phosphate (CELESTONE) injection 12 mg     Laterality: Right  Location/Site:  L5-S1  Needle size: 20 G  Needle type: Tuohy  Needle Placement: Paramedian epidural  Findings:  -Contrast Used: 0.5 mL iohexol 180 mg iodine/mL   -Comments: Excellent flow of contrast into the epidural space.  Procedure Details: Using a paramedian approach from the side mentioned above, the region overlying the inferior lamina was localized under fluoroscopic visualization and the soft tissues overlying this structure were infiltrated with 4 ml. of 1% Lidocaine without Epinephrine. The Tuohy needle was inserted into the epidural space using a paramedian approach.   The epidural space was localized using loss of resistance along with lateral and  bi-planar fluoroscopic views.  After negative aspirate for air, blood, and CSF, a 2 ml. volume of Isovue-250 was injected into the epidural space and the flow of contrast was observed. Radiographs were obtained for documentation purposes.    The injectate was administered into the level noted above.   Additional Comments:  The patient tolerated the procedure well Dressing: Band-Aid    Post-procedure details: Patient was observed during the procedure. Post-procedure instructions were reviewed.  Patient left the clinic in stable condition.

## 2017-05-07 ENCOUNTER — Ambulatory Visit (INDEPENDENT_AMBULATORY_CARE_PROVIDER_SITE_OTHER): Payer: Medicare Other | Admitting: Family Medicine

## 2017-05-07 ENCOUNTER — Encounter: Payer: Self-pay | Admitting: Family Medicine

## 2017-05-07 VITALS — BP 128/78 | HR 73 | Temp 98.3°F | Ht 62.0 in | Wt 178.8 lb

## 2017-05-07 DIAGNOSIS — M25552 Pain in left hip: Secondary | ICD-10-CM

## 2017-05-07 DIAGNOSIS — R1013 Epigastric pain: Secondary | ICD-10-CM | POA: Diagnosis not present

## 2017-05-07 DIAGNOSIS — G4700 Insomnia, unspecified: Secondary | ICD-10-CM | POA: Diagnosis not present

## 2017-05-07 MED ORDER — TRAZODONE HCL 50 MG PO TABS: ORAL_TABLET | ORAL | 3 refills | Status: DC

## 2017-05-07 MED ORDER — TRAMADOL HCL 50 MG PO TABS: 50.0000 mg | ORAL_TABLET | Freq: Two times a day (BID) | ORAL | 0 refills | Status: DC

## 2017-05-07 MED ORDER — SUCRALFATE 1 G PO TABS: ORAL_TABLET | ORAL | 1 refills | Status: DC

## 2017-05-07 NOTE — Progress Notes (Signed)
Heather [name] is a 76 y.o. female is here for follow up.  History of Present Illness:   Water quality scientist, CMA, acting as scribe for Dr. Juleen China.  HPI:  Patient comes in today for follow up.  She is accompanied by her daughter and an interpreter.  States she was prescribed amoxicillin by her gastroenterologist and had an allergic reaction of swelling and a rash.  She states that since that time she has had stomach pain.  She also has a sour taste in her mouth.  She states she had an upper endoscopy and was diagnosed with H. pylori.  She states she was prescribed other medications as well but she is unsure what she is taking.    She states she got an injection in her right hip and it has helped with her pain but she is having pain in her left hip as well.  She asks for a refill on her tramadol today.  She continues to have imbalance that comes and goes.  She has not yet seen neurology.  Patient's daughter states she is not sleeping well.  She has not had her sleep study yet.  Health Maintenance Due  Topic Date Due  . OPHTHALMOLOGY EXAM  01/01/1951  . TETANUS/TDAP  01/02/1959  . DEXA SCAN  01/01/2005  . HEMOGLOBIN A1C  05/23/2016  . FOOT EXAM  09/15/2016   Depression screen [facility] 2/9 05/27/2016 05/06/2016 04/22/2016  Decreased Interest 0 0 0  Down, Depressed, Hopeless 0 0 0  PHQ - 2 Score 0 0 0  Altered sleeping 1 0 -  Tired, decreased energy 0 0 -  Change in appetite 0 0 -  Feeling bad or failure about yourself  0 0 -  Trouble concentrating 0 0 -  Moving slowly or fidgety/restless 0 0 -  Suicidal thoughts 0 0 -  PHQ-9 Score 1 0 -   PMHx, SurgHx, SocialHx, FamHx, Medications, and Allergies were reviewed in the Visit Navigator and updated as appropriate.   Patient Active Problem List   Diagnosis Date Noted  . Controlled type 2 diabetes mellitus with chronic kidney disease, without long-term current use of insulin ([place]) 04/27/2017  . Scoliosis of lumbar spine 06/02/2016  .  Complete tear of right rotator cuff 06/02/2016  . Neurogenic claudication due to lumbar spinal stenosis 05/06/2016  . Right shoulder pain 05/06/2016  . Sjogren's syndrome ([place]) 04/27/2016  . Xerostomia 04/27/2016  . Loss of hair 11/21/2015  . Postmenopausal atrophic vaginitis 09/16/2015  . CKD (chronic kidney disease) stage 4, GFR 15-29 ml/min ([facility]) 05/06/2015  . Bilateral hip pain 05/06/2015  . Seborrheic keratoses 05/06/2015  . 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 Crohn's disease ([place]) 02/16/2014  . Prediabetes 01/29/2014  . OSA (obstructive sleep apnea) 01/29/2014  . Pulmonary hypertension ([place]) 01/29/2014  . CAD (coronary artery disease) 01/29/2014  . Hypertension 01/29/2014  . Diastolic CHF ([place]) 48/54/6270   Social History  Substance Use Topics  . Smoking status: Never Smoker  . Smokeless tobacco: Never Used  . Alcohol use No   Current Medications and Allergies:   .  albuterol (PROVENTIL HFA;VENTOLIN HFA) 108 (90 Base) MCG/ACT inhaler, Inhale 2 puffs into the lungs every 6 (six) hours as needed for wheezing or shortness of breath., Disp: , Rfl:  .  amLODipine (NORVASC) 5 MG tablet, Take 1 tablet (5 mg total) by mouth daily., Disp: 90 tablet, Rfl: 1 .  aspirin 81 MG EC tablet, Take 1 tablet (81 mg total) by mouth daily., Disp: 30  tablet, Rfl: 5 .  carvedilol (COREG) 6.25 MG tablet, TAKE 1 TABLET BY MOUTH 2 TIMES DAILY WITH A MEAL., Disp: 180 tablet, Rfl: 3 .  fluticasone (FLOVENT HFA) 220 MCG/ACT inhaler, Inhale 1 puff into the lungs 2 (two) times daily., Disp: 1 Inhaler, Rfl: 12 .  furosemide (LASIX) 40 MG tablet, Take one tablet (40 mg) by mouth once daily as needed for swelling/ shortness of breath, Disp: , Rfl:  .  gabapentin (NEURONTIN) 100 MG capsule, Take 1 capsule (100 mg total) by mouth 2 (two) times daily., Disp: 60 capsule, Rfl: 3 .  hydrochlorothiazide (MICROZIDE) 12.5 MG capsule, Take 1 capsule (12.5 mg total) by mouth daily., Disp: 30 capsule, Rfl: 11 .  losartan  (COZAAR) 50 MG tablet, Take 1 tablet (50 mg total) by mouth daily., Disp: 90 tablet, Rfl: 3 .  oxybutynin (DITROPAN-XL) 10 MG 24 hr tablet, Take 10 mg by mouth at bedtime., Disp: , Rfl:  .  pantoprazole (PROTONIX) 40 MG tablet, Take 1 tablet (40 mg total) by mouth daily., Disp: 90 tablet, Rfl: 3 .  Polyethyl Glycol-Propyl Glycol (SYSTANE) 0.4-0.3 % SOLN, Apply 2 drops to eye as needed., Disp: 10 mL, Rfl: 0 .  sodium chloride (OCEAN) 0.65 % SOLN nasal spray, Place 1 spray into both nostrils as needed (dry nose)., Disp: 30 mL, Rfl: 0 .  sucralfate (CARAFATE) 1 g tablet, Take 1 tablet (1 g total) by mouth 4 (four) times daily -  with meals and at bedtime. (Patient taking differently: Take 1 g by mouth 2 (two) times daily. ), Disp: 30 tablet, Rfl: 0 .  traMADol (ULTRAM) 50 MG tablet, Take 1 tablet (50 mg total) by mouth 2 (two) times daily., Disp: 60 tablet, Rfl: 0  Allergies  Allergen Reactions  . Amoxicillin-Pot Clavulanate Rash   Review of Systems   Pertinent items are noted in the HPI. Otherwise, ROS is negative.  Vitals:   Vitals:   05/07/17 0806  BP: 128/78  Pulse: 73  Temp: 98.3 F (36.8 C)  TempSrc: Oral  SpO2: 96%  Weight: 178 lb 12.8 oz (81.1 kg)  Height: 5' 2"  (1.575 m)     Body mass index is 32.7 kg/m.   Physical Exam:   Physical Exam  Constitutional: She appears well-nourished.  HENT:  Head: Normocephalic and atraumatic.  Eyes: Pupils are equal, round, and reactive to light. EOM are normal.  Neck: Normal range of motion. Neck supple.  Cardiovascular: Normal rate, regular rhythm, normal heart sounds and intact distal pulses.   Pulmonary/Chest: Effort normal.  Abdominal: Soft.  Skin: Skin is warm.  Psychiatric: She has a normal mood and affect. Her behavior is normal.  Nursing note and vitals reviewed.    Assessment and Plan:   Katyra was seen today for follow-up.  Diagnoses and all orders for this visit:  Insomnia, unspecified type -     traZODone  (DESYREL) 50 MG tablet; Take 1/2 to 1 tablet at bedtime as needed for sleep  Left hip pain -     traMADol (ULTRAM) 50 MG tablet; Take 1 tablet (50 mg total) by mouth 2 (two) times daily.  Dyspepsia -     sucralfate (CARAFATE) 1 g tablet; Take 1 tablet four times daily - with meals and at bedtime   . Reviewed expectations re: course of current medical issues. . Discussed self-management of symptoms. . Outlined signs and symptoms indicating need for more acute intervention. . Patient verbalized understanding and all questions were answered. Marland Kitchen Health Maintenance  issues including appropriate healthy diet, exercise, and smoking avoidance were discussed with patient. . See orders for this visit as documented in the electronic medical record. . Patient received an After Visit Summary.  CMA served as Education administrator during this visit. History, Physical, and Plan performed by medical provider. The above documentation has been reviewed and is accurate and complete. Briscoe Deutscher, D.O.  Briscoe Deutscher, DO [place] [place] 05/15/2017  Future Appointments Date Time Provider [place]  06/04/2017 9:20 AM Gerda Diss, DO [facility] None  06/15/2017 10:45 AM [facility] DEVICE REMOTES [facility]  06/28/2017 9:20 AM Briscoe Deutscher, DO [facility] None  08/12/2017 3:00 PM [place], AUD [facility] None

## 2017-05-07 NOTE — Patient Instructions (Addendum)
Take carafate 1 tablet four times daily.  You will be contacted to schedule your sleep study.  Trazodone 1/2 to 1 tablet at night for sleep.  Tramadol one tablet twice daily as needed for pain.

## 2017-05-21 DIAGNOSIS — I129 Hypertensive chronic kidney disease with stage 1 through stage 4 chronic kidney disease, or unspecified chronic kidney disease: Secondary | ICD-10-CM | POA: Diagnosis not present

## 2017-05-21 DIAGNOSIS — N183 Chronic kidney disease, stage 3 (moderate): Secondary | ICD-10-CM | POA: Diagnosis not present

## 2017-05-28 ENCOUNTER — Encounter: Payer: Self-pay | Admitting: Neurology

## 2017-05-28 ENCOUNTER — Ambulatory Visit (INDEPENDENT_AMBULATORY_CARE_PROVIDER_SITE_OTHER): Payer: Medicare Other | Admitting: Neurology

## 2017-05-28 VITALS — BP 93/60 | HR 74 | Ht 62.0 in | Wt 179.6 lb

## 2017-05-28 DIAGNOSIS — R2689 Other abnormalities of gait and mobility: Secondary | ICD-10-CM | POA: Diagnosis not present

## 2017-05-28 DIAGNOSIS — R413 Other amnesia: Secondary | ICD-10-CM

## 2017-05-28 DIAGNOSIS — Z950 Presence of cardiac pacemaker: Secondary | ICD-10-CM | POA: Diagnosis not present

## 2017-05-28 DIAGNOSIS — Z9181 History of falling: Secondary | ICD-10-CM | POA: Diagnosis not present

## 2017-05-28 DIAGNOSIS — M542 Cervicalgia: Secondary | ICD-10-CM | POA: Diagnosis not present
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
## 2017-05-30 DIAGNOSIS — R2689 Other abnormalities of gait and mobility: Secondary | ICD-10-CM | POA: Insufficient documentation

## 2017-06-01 LAB — RPR: RPR Ser Ql: NONREACTIVE

## 2017-06-01 LAB — B12 AND FOLATE PANEL
Folate: 16.2 ng/mL (ref >3.0)
Vitamin B-12: 579 pg/mL (ref 232–1245)

## 2017-06-01 LAB — METHYLMALONIC ACID, SERUM: Methylmalonic Acid: 959 nmol/L — ABNORMAL HIGH (ref 0–378)

## 2017-06-02 ENCOUNTER — Telehealth: Payer: Self-pay | Admitting: *Deleted

## 2017-06-02 DIAGNOSIS — R413 Other amnesia: Secondary | ICD-10-CM | POA: Diagnosis not present

## 2017-06-02 DIAGNOSIS — G4733 Obstructive sleep apnea (adult) (pediatric): Secondary | ICD-10-CM | POA: Diagnosis not present

## 2017-06-02 DIAGNOSIS — G8929 Other chronic pain: Secondary | ICD-10-CM | POA: Diagnosis not present

## 2017-06-02 DIAGNOSIS — M5440 Lumbago with sciatica, unspecified side: Secondary | ICD-10-CM | POA: Diagnosis not present

## 2017-06-02 DIAGNOSIS — E119 Type 2 diabetes mellitus without complications: Secondary | ICD-10-CM | POA: Diagnosis not present

## 2017-06-02 DIAGNOSIS — I509 Heart failure, unspecified: Secondary | ICD-10-CM | POA: Diagnosis not present

## 2017-06-02 DIAGNOSIS — I110 Hypertensive heart disease with heart failure: Secondary | ICD-10-CM | POA: Diagnosis not present

## 2017-06-02 DIAGNOSIS — G43909 Migraine, unspecified, not intractable, without status migrainosus: Secondary | ICD-10-CM | POA: Diagnosis not present

## 2017-06-02 DIAGNOSIS — R260 Ataxic gait: Secondary | ICD-10-CM | POA: Diagnosis not present

## 2017-06-02 DIAGNOSIS — M542 Cervicalgia: Secondary | ICD-10-CM | POA: Diagnosis not present

## 2017-06-02 NOTE — Telephone Encounter (Signed)
-----   Message from Melvenia Beam, MD sent at 05/29/2017  6:33 PM EST ----- Labs normal thanks

## 2017-06-02 NOTE — Telephone Encounter (Signed)
Called and spoke with Heather [name] (on DPR). She verbalized understanding of normal lab results and had no questions.

## 2017-06-03 DIAGNOSIS — E119 Type 2 diabetes mellitus without complications: Secondary | ICD-10-CM | POA: Diagnosis not present

## 2017-06-03 DIAGNOSIS — R260 Ataxic gait: Secondary | ICD-10-CM | POA: Diagnosis not present

## 2017-06-03 DIAGNOSIS — M542 Cervicalgia: Secondary | ICD-10-CM | POA: Diagnosis not present

## 2017-06-03 DIAGNOSIS — M5440 Lumbago with sciatica, unspecified side: Secondary | ICD-10-CM | POA: Diagnosis not present

## 2017-06-03 DIAGNOSIS — G8929 Other chronic pain: Secondary | ICD-10-CM | POA: Diagnosis not present

## 2017-06-03 DIAGNOSIS — G4733 Obstructive sleep apnea (adult) (pediatric): Secondary | ICD-10-CM | POA: Diagnosis not present

## 2017-06-04 ENCOUNTER — Ambulatory Visit (INDEPENDENT_AMBULATORY_CARE_PROVIDER_SITE_OTHER): Payer: Medicare Other | Admitting: Sports Medicine

## 2017-06-04 ENCOUNTER — Encounter: Payer: Self-pay | Admitting: Sports Medicine

## 2017-06-04 VITALS — BP 144/74 | HR 77 | Ht 62.0 in | Wt 178.8 lb

## 2017-06-04 DIAGNOSIS — M47812 Spondylosis without myelopathy or radiculopathy, cervical region: Secondary | ICD-10-CM | POA: Insufficient documentation

## 2017-06-04 DIAGNOSIS — G8929 Other chronic pain: Secondary | ICD-10-CM | POA: Diagnosis not present

## 2017-06-04 DIAGNOSIS — M48062 Spinal stenosis, lumbar region with neurogenic claudication: Secondary | ICD-10-CM | POA: Diagnosis not present

## 2017-06-04 DIAGNOSIS — M25552 Pain in left hip: Secondary | ICD-10-CM

## 2017-06-04 DIAGNOSIS — M75121 Complete rotator cuff tear or rupture of right shoulder, not specified as traumatic: Secondary | ICD-10-CM

## 2017-06-04 DIAGNOSIS — M25511 Pain in right shoulder: Secondary | ICD-10-CM | POA: Diagnosis not present

## 2017-06-04 MED ORDER — GABAPENTIN 100 MG PO CAPS: 200.0000 mg | ORAL_CAPSULE | Freq: Two times a day (BID) | ORAL | 3 refills | Status: DC

## 2017-06-04 NOTE — Assessment & Plan Note (Signed)
Chronic issues with her right shoulder.  Question if this is coming from a true intrinsic rotator cuff etiology versus a potential radiculopathy.  She is undergoing cervical CT scan by neurology which should shed some light into her upper extremities.  She does have some generalized weakness as well as fasciculations and a questionable Hoffmann sign on the right. Plan to see what the CT scan reveals and if amenable could consider cervical epidural steroid injection. She does have an MRI conditional pacemaker however is not an ideal candidate for this.  If no evidence of significant stenosis on CT scan can consider further diagnostic evaluation of the right shoulder.

## 2017-06-04 NOTE — Progress Notes (Signed)
OFFICE VISIT NOTE Heather Bond. [place], [place] at [facility] - 76 y.o. female MRN [id number]  Date of birth: 1940/10/31  Visit Date: 06/04/2017  PCP: Briscoe Deutscher, DO   Referred by: Briscoe Deutscher, DO  Thalia Bloodgood PT, LAT, ATC acting as scribe for Dr. Paulla Fore.  SUBJECTIVE:   Chief Complaint  Patient presents with  . Follow-up    B hip pain and R shoulder pain   HPI: As below and per problem based documentation when appropriate.  Falyn is an established pt and presenting today for f/u of her B hip and R shoulder pain.  She was last seen on 04/09/17.  She has taken and completed a prednisone taper and is currently taking Gabapentin.  PT was suggested at her last visit but she did not schedule any appointments.  She had a lumbar epidural on 04/29/17.  Pt states that she has been feeling very good but wants to know if she can have another one perhaps in the middle of her lumbar spine.  Pt states that her R shoulder con't to hurt and bother her and does not notice any change in those symptoms.  Pt will be starting home PT next week mainly for balance issues.   Review of Systems  Constitutional: Negative for chills and fever.  HENT: Negative.   Eyes: Negative.   Respiratory: Positive for cough. Negative for shortness of breath and wheezing.   Cardiovascular: Negative for chest pain and palpitations.  Gastrointestinal: Negative for abdominal pain, heartburn and nausea.  Genitourinary: Negative.   Musculoskeletal: Positive for joint pain. Negative for falls.  Neurological: Positive for tingling and headaches. Negative for dizziness.  Endo/Heme/Allergies: Does not bruise/bleed easily.  Psychiatric/Behavioral: Negative for depression. The patient is not nervous/anxious.     Otherwise per HPI.  HISTORY & PERTINENT PRIOR DATA:  The 10-year ASCVD risk score Mikey Bussing DC Jr., et al., 2013) is: 58.3%   Values used to  calculate the score:     Age: 34 years     Sex: Female     Is Non-Hispanic African American: No     Diabetic: Yes     Tobacco smoker: No     Systolic Blood Pressure: 829 mmHg     Is BP treated: Yes     HDL Cholesterol: 52.1 mg/dL     Total Cholesterol: 191 mg/dL She reports that  has never smoked. she has never used smokeless tobacco. No results for input(s): HGBA1C, LABURIC, CREATINE in the last 8760 hours.  Invalid input(s): CR  Patient Active Problem List   Diagnosis Date Noted  . Osteoarthritis of cervical spine 06/04/2017  . Imbalance 05/30/2017  . Controlled type 2 diabetes mellitus with chronic kidney disease, without long-term current use of insulin ([place]) 04/27/2017  . Scoliosis of lumbar spine 06/02/2016  . Complete tear of right rotator cuff 06/02/2016  . Neurogenic claudication due to lumbar spinal stenosis 05/06/2016  . Right shoulder pain 05/06/2016  . Sjogren's syndrome ([place]) 04/27/2016  . Xerostomia 04/27/2016  . Loss of hair 11/21/2015  . Postmenopausal atrophic vaginitis 09/16/2015  . CKD (chronic kidney disease) stage 4, GFR 15-29 ml/min (H[facility]) 05/06/2015  . Bilateral hip pain 05/06/2015  . Seborrheic keratoses 05/06/2015  . 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 Crohn's disease ([place]) 02/16/2014  . Prediabetes 01/29/2014  . OSA (obstructive sleep apnea) 01/29/2014  . Pulmonary hypertension ([place]) 01/29/2014  . CAD (coronary artery disease) 01/29/2014  .  Hypertension 01/29/2014  . Diastolic CHF ([place]) 47/65/4650   Past Medical History:  Diagnosis Date  . CHF (congestive heart failure) ([place])   . Chronic back pain   . Crohn's disease ([place])   . GERD (gastroesophageal reflux disease)   . High cholesterol   . History of diabetes mellitus, type II   . History of echocardiogram    Echo 12/16: EF 60-65%, no RWMA, Gr 2 DD, mild AS (mean 13 mmHg), mild AI, MAC, trivial MS, mild MR, mild LAE, mild RVE, mild to mod TR, PASP 36 mmHg  . Hypertension   . Migraine    "1-2  times/yr" (02/16/2014)  . Murmur, cardiac   . OSA on CPAP   . 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 Pulmonary hypertension ([place])   . Sinus bradycardia 01/29/2014  . Skin cancer 2003   "3 cut off face" (02/16/2014)   Family History  Adopted: Yes  Problem Relation Age of Onset  . Hypertension Mother   . Heart disease Mother    Past Surgical History:  Procedure Laterality Date  . CARDIAC CATHETERIZATION  1995; 2005  . CATARACT EXTRACTION Bilateral 1982  . ESOPHAGOGASTRODUODENOSCOPY (EGD) WITH PROPOFOL N/A 12/28/2016   Performed by Otis Brace, MD at [place]  . INSERT / REPLACE / REMOVE PACEMAKER  02/14/2014  . PERMANENT PACEMAKER INSERTION N/A 02/14/2014   Performed by Deboraha Sprang, MD at [facility] CATH LAB  . SKIN CANCER EXCISION  2003    "face X 3" (02/16/2014)  . TEMPORARY PACEMAKER INSERTION N/A 02/12/2014   Performed by Lorretta Harp, MD at [facility] CATH LAB  . VAGINAL HYSTERECTOMY  1972   Social History   Occupational History  . Not on file  Tobacco Use  . Smoking status: Never Smoker  . Smokeless tobacco: Never Used  Substance and Sexual Activity  . Alcohol use: No  . Drug use: No  . Sexual activity: No   Allergies  Allergen Reactions  . Amoxicillin-Pot Clavulanate Swelling and Rash   Outpatient Medications Prior to Visit  Medication Sig Dispense Refill  . albuterol (PROVENTIL HFA;VENTOLIN HFA) 108 (90 Base) MCG/ACT inhaler Inhale 2 puffs into the lungs every 6 (six) hours as needed for wheezing or shortness of breath.    Marland Kitchen amLODipine (NORVASC) 5 MG tablet Take 1 tablet (5 mg total) by mouth daily. 90 tablet 1  . aspirin 81 MG EC tablet Take 1 tablet (81 mg total) by mouth daily. 30 tablet 5  . carvedilol (COREG) 6.25 MG tablet TAKE 1 TABLET BY MOUTH 2 TIMES DAILY WITH A MEAL. 180 tablet 3  . fluticasone (FLOVENT HFA) 220 MCG/ACT inhaler Inhale 1 puff into the lungs 2 (two) times daily. 1 Inhaler 12  . furosemide (LASIX) 40 MG tablet Take one tablet (40 mg) by mouth  once daily as needed for swelling/ shortness of breath    . hydrochlorothiazide (MICROZIDE) 12.5 MG capsule Take 1 capsule (12.5 mg total) by mouth daily. 30 capsule 11  . losartan (COZAAR) 50 MG tablet Take 1 tablet (50 mg total) by mouth daily. 90 tablet 3  . oxybutynin (DITROPAN-XL) 10 MG 24 hr tablet Take 10 mg by mouth at bedtime.    . pantoprazole (PROTONIX) 40 MG tablet Take 1 tablet (40 mg total) by mouth daily. 90 tablet 3  . Polyethyl Glycol-Propyl Glycol (SYSTANE) 0.4-0.3 % SOLN Apply 2 drops to eye as needed. 10 mL 0  . sodium chloride (OCEAN) 0.65 % SOLN nasal spray Place 1 spray into both  nostrils as needed (dry nose). 30 mL 0  . sucralfate (CARAFATE) 1 g tablet Take 1 tablet four times daily - with meals and at bedtime 120 tablet 1  . traMADol (ULTRAM) 50 MG tablet Take 1 tablet (50 mg total) by mouth 2 (two) times daily. 60 tablet 0  . traZODone (DESYREL) 50 MG tablet Take 1/2 to 1 tablet at bedtime as needed for sleep 30 tablet 3  . gabapentin (NEURONTIN) 100 MG capsule Take 1 capsule (100 mg total) by mouth 2 (two) times daily. 60 capsule 3   No facility-administered medications prior to visit.     OBJECTIVE:  VS:  HT:_0  (157.5 cm)   WT:178 lb 12.8 oz (81.1 kg)  BMI:32.69    BP:(!) 144/74  HR:77bpm  TEMP: ( )  RESP:93 % EXAM: Findings:  Adult female.  No acute distress.  Alert and appropriate.  Here today with her daughter who serves as her interpreter at her request. Her bilateral upper extremities are overall well aligned.  She does hold her right shoulder slightly forward. Her rotator cuff strength with empty can testing and speeds testing is slightly limited but she is antigravity in all test.  Grip strength is intact.  She does have a questionable Hoffmann sign on the right and pain with arm squeeze test and brachial plexus squeeze bilaterally.  She does have a positive Lhermitte's compression test sending pain into the mid thoracic region.  Lower extremities  have improved range of motion and negative straight leg raise.    RADIOLOGY: Epidural Steroid injection Magnus Sinning, MD     05/06/2017  5:36 AM [name] is a 76 year old female followed by Dr. Paulla Fore.  She is having radicular type leg pain on the right side with  paresthesia. This is chronic and worsening. She has failed  conservative care including time and medication management  including gabapentin and tramadol. Dr. Paulla Fore request diagnostic  and hopefully therapeutic right L5-S1 intralaminar epidural  steroid injection. Depending on outcome would likely look at MRI  of the lumbar spine. She has no red flag symptoms.  Lumbar Epidural Steroid Injection - Interlaminar Approach with  Fluoroscopic Guidance  Patient: Rhilyn Battle      Date of Birth: 1940/11/14 MRN: [id number] PCP: Briscoe Deutscher, DO      Visit Date: 04/29/2017   Universal Protocol:     Consent Given By: the patient  Position: PRONE  Additional Comments: Vital signs were monitored before and after the procedure. Patient was prepped and draped in the usual sterile fashion. The correct patient, procedure, and site was verified.  Injection Procedure Details:  Procedure Site One Meds Administered:  Meds ordered this encounter  Medications  . lidocaine (PF) (XYLOCAINE) 1 % injection 2 mL  . betamethasone acetate-betamethasone sodium phosphate  (CELESTONE) injection 12 mg     Laterality: Right  Location/Site:  L5-S1  Needle size: 20 G  Needle type: Tuohy  Needle Placement: Paramedian epidural  Findings:  -Contrast Used: 0.5 mL iohexol 180 mg iodine/mL   -Comments: Excellent flow of contrast into the epidural space.  Procedure Details: Using a paramedian approach from the side mentioned above, the  region overlying the inferior lamina was localized under  fluoroscopic visualization and the soft tissues overlying this  structure were infiltrated with 4 ml. of 1% Lidocaine without    Epinephrine. The Tuohy needle was inserted into the epidural  space using a paramedian approach.   The epidural space was localized using loss of  resistance along  with lateral and bi-planar fluoroscopic views.  After negative  aspirate for air, blood, and CSF, a 2 ml. volume of Isovue-250  was injected into the epidural space and the flow of contrast was  observed. Radiographs were obtained for documentation purposes.    The injectate was administered into the level noted above.  Additional Comments:  The patient tolerated the procedure well Dressing: Band-Aid    Post-procedure details: Patient was observed during the procedure. Post-procedure instructions were reviewed.  Patient left the clinic in stable condition.  ASSESSMENT & PLAN:     ICD-10-CM   1. Chronic right shoulder pain M25.511    G89.29   2. Neurogenic claudication due to lumbar spinal stenosis M48.062 gabapentin (NEURONTIN) 100 MG capsule  3. Complete tear of right rotator cuff M75.121   4. Left hip pain M25.552   5. Osteoarthritis of cervical spine, unspecified spinal osteoarthritis complication status M15.868    ================================================================= Complete tear of right rotator cuff Chronic issues with her right shoulder.  Question if this is coming from a true intrinsic rotator cuff etiology versus a potential radiculopathy.  She is undergoing cervical CT scan by neurology which should shed some light into her upper extremities.  She does have some generalized weakness as well as fasciculations and a questionable Hoffmann sign on the right. Plan to see what the CT scan reveals and if amenable could consider cervical epidural steroid injection. She does have an MRI conditional pacemaker however is not an ideal candidate for this.  If no evidence of significant stenosis on CT scan can consider further diagnostic evaluation of the right shoulder.  Neurogenic claudication due to  lumbar spinal stenosis Overall significantly improved from her hip and low back pain following epidural steroid injection.  Can consider repeat if recurrent.  Osteoarthritis of cervical spine Further diagnostic evaluation of the cervical spine indicated with CT scan which will be ordered by neurology.  We will plan to follow-up with his myself as well and can consider referral back to Dr. Ernestina Patches.  Can also consider nerve conduction studies if indicated.  She does have a pacemaker however it appears to be MRI conditional per the surgery note and this could be considered.  =================================================================  Follow-up: Return in about 10 weeks (around 08/13/2017).   CMA/ATC served as Education administrator during this visit. History, Physical, and Plan performed by medical provider. Documentation and orders reviewed and attested to.      Teresa Coombs, [place] [facility] Physician

## 2017-06-05 NOTE — Assessment & Plan Note (Signed)
Overall significantly improved from her hip and low back pain following epidural steroid injection.  Can consider repeat if recurrent.

## 2017-06-05 NOTE — Assessment & Plan Note (Signed)
Further diagnostic evaluation of the cervical spine indicated with CT scan which will be ordered by neurology.  We will plan to follow-up with his myself as well and can consider referral back to Dr. Ernestina Patches.  Can also consider nerve conduction studies if indicated.  She does have a pacemaker however it appears to be MRI conditional per the surgery note and this could be considered.

## 2017-06-08 DIAGNOSIS — G4733 Obstructive sleep apnea (adult) (pediatric): Secondary | ICD-10-CM | POA: Diagnosis not present

## 2017-06-08 DIAGNOSIS — M542 Cervicalgia: Secondary | ICD-10-CM | POA: Diagnosis not present

## 2017-06-08 DIAGNOSIS — G8929 Other chronic pain: Secondary | ICD-10-CM | POA: Diagnosis not present

## 2017-06-08 DIAGNOSIS — R260 Ataxic gait: Secondary | ICD-10-CM | POA: Diagnosis not present

## 2017-06-08 DIAGNOSIS — E119 Type 2 diabetes mellitus without complications: Secondary | ICD-10-CM | POA: Diagnosis not present

## 2017-06-08 DIAGNOSIS — M5440 Lumbago with sciatica, unspecified side: Secondary | ICD-10-CM | POA: Diagnosis not present

## 2017-06-09 ENCOUNTER — Ambulatory Visit
Admission: RE | Admit: 2017-06-09 | Discharge: 2017-06-09 | Disposition: A | Discharge status: Home / Self Care | Payer: Medicare Other | Source: Ambulatory Visit | Attending: Neurology | Admitting: Neurology

## 2017-06-09 DIAGNOSIS — M542 Cervicalgia: Secondary | ICD-10-CM

## 2017-06-09 DIAGNOSIS — M4802 Spinal stenosis, cervical region: Secondary | ICD-10-CM | POA: Diagnosis not present

## 2017-06-09 DIAGNOSIS — Z9181 History of falling: Secondary | ICD-10-CM

## 2017-06-11 DIAGNOSIS — M5440 Lumbago with sciatica, unspecified side: Secondary | ICD-10-CM | POA: Diagnosis not present

## 2017-06-11 DIAGNOSIS — G8929 Other chronic pain: Secondary | ICD-10-CM | POA: Diagnosis not present

## 2017-06-11 DIAGNOSIS — G4733 Obstructive sleep apnea (adult) (pediatric): Secondary | ICD-10-CM | POA: Diagnosis not present

## 2017-06-11 DIAGNOSIS — R260 Ataxic gait: Secondary | ICD-10-CM | POA: Diagnosis not present

## 2017-06-11 DIAGNOSIS — M542 Cervicalgia: Secondary | ICD-10-CM | POA: Diagnosis not present

## 2017-06-11 DIAGNOSIS — E119 Type 2 diabetes mellitus without complications: Secondary | ICD-10-CM | POA: Diagnosis not present

## 2017-06-15 ENCOUNTER — Ambulatory Visit (INDEPENDENT_AMBULATORY_CARE_PROVIDER_SITE_OTHER): Payer: Medicare Other | Admitting: *Deleted

## 2017-06-15 ENCOUNTER — Telehealth: Payer: Self-pay | Admitting: *Deleted

## 2017-06-15 DIAGNOSIS — E119 Type 2 diabetes mellitus without complications: Secondary | ICD-10-CM | POA: Diagnosis not present

## 2017-06-15 DIAGNOSIS — G8929 Other chronic pain: Secondary | ICD-10-CM | POA: Diagnosis not present

## 2017-06-15 DIAGNOSIS — I495 Sick sinus syndrome: Secondary | ICD-10-CM | POA: Diagnosis not present

## 2017-06-15 DIAGNOSIS — M542 Cervicalgia: Secondary | ICD-10-CM | POA: Diagnosis not present

## 2017-06-15 DIAGNOSIS — M5440 Lumbago with sciatica, unspecified side: Secondary | ICD-10-CM | POA: Diagnosis not present

## 2017-06-15 DIAGNOSIS — R260 Ataxic gait: Secondary | ICD-10-CM | POA: Diagnosis not present

## 2017-06-15 DIAGNOSIS — G4733 Obstructive sleep apnea (adult) (pediatric): Secondary | ICD-10-CM | POA: Diagnosis not present

## 2017-06-15 NOTE — Telephone Encounter (Addendum)
Called patient's daughter Juanita Laster (on [place]) and LVM (ok per DPR) informing her of Melida's cervical spine results detailed below. Told to call back if needed but a call is not necessary. Left office number.   ----- Message from Melvenia Beam, MD sent at 06/14/2017  4:40 PM EST ----- She has arthritis in her neck but the spinal cord is normal so no etiology for her imbalance I the cervical spine thanks

## 2017-06-15 NOTE — Progress Notes (Signed)
Remote pacemaker transmission.   

## 2017-06-17 ENCOUNTER — Telehealth: Payer: Self-pay | Admitting: *Deleted

## 2017-06-17 LAB — CUP PACEART REMOTE DEVICE CHECK
Battery Remaining Longevity: 82 mo
Battery Voltage: 3.01 V
Brady Statistic AP VP Percent: 0.03 %
Brady Statistic AP VS Percent: 85.5 %
Brady Statistic AS VP Percent: 0.01 %
Brady Statistic AS VS Percent: 14.46 %
Brady Statistic RA Percent Paced: 85.29 %
Brady Statistic RV Percent Paced: 0.04 %
Date Time Interrogation Session: 20181127170058
Implantable Lead Implant Date: 20150729
Implantable Lead Implant Date: 20150729
Implantable Lead Location: 753859
Implantable Lead Location: 753860
Implantable Lead Model: 5076
Implantable Lead Model: 5076
Implantable Pulse Generator Implant Date: 20150729
Lead Channel Impedance Value: 361 Ohm
Lead Channel Impedance Value: 494 Ohm
Lead Channel Impedance Value: 494 Ohm
Lead Channel Impedance Value: 551 Ohm
Lead Channel Pacing Threshold Amplitude: 0.875 V
Lead Channel Pacing Threshold Amplitude: 1 V
Lead Channel Pacing Threshold Pulse Width: 0.4 ms
Lead Channel Pacing Threshold Pulse Width: 0.4 ms
Lead Channel Sensing Intrinsic Amplitude: 12.125 mV
Lead Channel Sensing Intrinsic Amplitude: 12.125 mV
Lead Channel Sensing Intrinsic Amplitude: 2.125 mV
Lead Channel Sensing Intrinsic Amplitude: 2.125 mV
Lead Channel Setting Pacing Amplitude: 2 V
Lead Channel Setting Pacing Amplitude: 2 V
Lead Channel Setting Pacing Pulse Width: 0.4 ms
Lead Channel Setting Sensing Sensitivity: 2 mV

## 2017-06-17 NOTE — Telephone Encounter (Signed)
PT plan of care paperwork signed & faxed back to [facility]. Received a receipt of confirmation.

## 2017-06-18 ENCOUNTER — Encounter: Payer: Self-pay | Admitting: Cardiology

## 2017-06-22 ENCOUNTER — Telehealth: Payer: Self-pay | Admitting: Sports Medicine

## 2017-06-22 DIAGNOSIS — R260 Ataxic gait: Secondary | ICD-10-CM | POA: Diagnosis not present

## 2017-06-22 DIAGNOSIS — G8929 Other chronic pain: Secondary | ICD-10-CM | POA: Diagnosis not present

## 2017-06-22 DIAGNOSIS — M5440 Lumbago with sciatica, unspecified side: Secondary | ICD-10-CM | POA: Diagnosis not present

## 2017-06-22 DIAGNOSIS — G4733 Obstructive sleep apnea (adult) (pediatric): Secondary | ICD-10-CM | POA: Diagnosis not present

## 2017-06-22 DIAGNOSIS — E119 Type 2 diabetes mellitus without complications: Secondary | ICD-10-CM | POA: Diagnosis not present

## 2017-06-22 DIAGNOSIS — M4722 Other spondylosis with radiculopathy, cervical region: Secondary | ICD-10-CM

## 2017-06-22 DIAGNOSIS — M542 Cervicalgia: Secondary | ICD-10-CM | POA: Diagnosis not present

## 2017-06-22 NOTE — Telephone Encounter (Signed)
Called pt and spoke to daughter regarding Tyauna's CT results and the fact that Dr. Paulla Fore has referred her to Dr. Ernestina Patches for an epidural steroid injection.

## 2017-06-22 NOTE — Telephone Encounter (Signed)
CT scan of the cervical spine did reveal multilevel degenerative changes that likely explain some of her upper extremity symptoms.  I agree with neurology this is not related to her lower extremity symptoms.  Upon further investigation as well she does have a MRI conditional pacemaker so if needed we can consider looking at this further but feels those appropriate start with an empiric epidural steroid injection and will plan to refer her to Dr. Ernestina Patches again for consultation on this.  He has seen her previously for lumbar spine which she responded well to.

## 2017-06-25 ENCOUNTER — Ambulatory Visit (HOSPITAL_BASED_OUTPATIENT_CLINIC_OR_DEPARTMENT_OTHER): Payer: Medicare Other | Attending: Family Medicine | Admitting: Internal Medicine

## 2017-06-25 VITALS — Ht 62.0 in | Wt 176.0 lb

## 2017-06-25 DIAGNOSIS — M542 Cervicalgia: Secondary | ICD-10-CM | POA: Diagnosis not present

## 2017-06-25 DIAGNOSIS — R0683 Snoring: Secondary | ICD-10-CM | POA: Diagnosis not present

## 2017-06-25 DIAGNOSIS — I493 Ventricular premature depolarization: Secondary | ICD-10-CM | POA: Insufficient documentation

## 2017-06-25 DIAGNOSIS — E119 Type 2 diabetes mellitus without complications: Secondary | ICD-10-CM | POA: Diagnosis not present

## 2017-06-25 DIAGNOSIS — Z79899 Other long term (current) drug therapy: Secondary | ICD-10-CM | POA: Insufficient documentation

## 2017-06-25 DIAGNOSIS — G8929 Other chronic pain: Secondary | ICD-10-CM | POA: Diagnosis not present

## 2017-06-25 DIAGNOSIS — G4733 Obstructive sleep apnea (adult) (pediatric): Secondary | ICD-10-CM | POA: Diagnosis not present

## 2017-06-25 DIAGNOSIS — G4736 Sleep related hypoventilation in conditions classified elsewhere: Secondary | ICD-10-CM | POA: Diagnosis not present

## 2017-06-25 DIAGNOSIS — R5383 Other fatigue: Secondary | ICD-10-CM | POA: Insufficient documentation

## 2017-06-25 DIAGNOSIS — R260 Ataxic gait: Secondary | ICD-10-CM | POA: Diagnosis not present

## 2017-06-25 DIAGNOSIS — M5440 Lumbago with sciatica, unspecified side: Secondary | ICD-10-CM | POA: Diagnosis not present

## 2017-06-27 NOTE — Procedures (Signed)
Patient Name: Heather, [name] Date: 06/25/2017 Gender: Female D.O.B: Oct 25, 1940 Age (years): 76 Referring Provider: Briscoe Deutscher DO Height (inches): 77 Interpreting Physician: Baird Lyons MD, ABSM Weight (lbs): 172 RPSGT: Baxter Flattery BMI: 31 MRN: [id number] Neck Size: 14.50 CLINICAL INFORMATION Sleep Study Type: NPSG  Indication for sleep study: Fatigue, Parasomnias, Snoring, Witnesses Apnea / Gasping During Sleep  Epworth Sleepiness Score: 6  SLEEP STUDY TECHNIQUE As per the AASM Manual for the Scoring of Sleep and Associated Events v2.3 (April 2016) with a hypopnea requiring 4% desaturations.  The channels recorded and monitored were frontal, central and occipital EEG, electrooculogram (EOG), submentalis EMG (chin), nasal and oral airflow, thoracic and abdominal wall motion, anterior tibialis EMG, snore microphone, electrocardiogram, and pulse oximetry.  MEDICATIONS Medications self-administered by patient taken the night of the study : TRAZODONE  SLEEP ARCHITECTURE The study was initiated at 9:52:01 PM and ended at 4:14:50 AM.  Sleep onset time was 21.5 minutes and the sleep efficiency was 73.4%. The total sleep time was 281.0 minutes.  Stage REM latency was 62.5 minutes.  The patient spent 1.42% of the night in stage N1 sleep, 84.16% in stage N2 sleep, 0.00% in stage N3 and 14.41% in REM.  Alpha intrusion was absent.  Supine sleep was 100.00%.  RESPIRATORY PARAMETERS The overall apnea/hypopnea index (AHI) was 28.4 per hour. There were 46 total apneas, including 46 obstructive, 0 central and 0 mixed apneas. There were 87 hypopneas and 0 RERAs.  The AHI during Stage REM sleep was 69.6 per hour.  AHI while supine was 28.4 per hour.  The mean oxygen saturation was 89.31%. The minimum SpO2 during sleep was 69.00%.  loud snoring was noted during this study.  CARDIAC DATA The 2 lead EKG demonstrated pacemaker generated. The mean heart rate was 71.13  beats per minute. Other EKG findings include: PVCs.  LEG MOVEMENT DATA The total PLMS were 0 with a resulting PLMS index of 0.00. Associated arousal with leg movement index was 0.0 .  IMPRESSIONS - Moderate obstructive sleep apnea occurred during this study (AHI = 28.4/h). - No significant central sleep apnea occurred during this study (CAI = 0.0/h). - Severe oxygen desaturation was noted during this study (Min O2 = 69.00%, Mean 89.3%). - The patient snored with loud snoring volume. - EKG findings include PVCs. Paced rhythm. - Clinically significant periodic limb movements did not occur during sleep. No significant associated arousals.  DIAGNOSIS - Obstructive Sleep Apnea (327.23 [G47.33 ICD-10]) - Nocturnal Hypoxemia (327.26 [G47.36 ICD-10])  RECOMMENDATIONS - Therapeutic CPAP titration to determine optimal pressure required to alleviate sleep disordered breathing. This will allow assessment of oxygenation while on CPAP, to determine if patient will require supplemental oxygen in addition to CPAP. - Positional therapy avoiding supine position during sleep. - Be careful with alcohol, sedatives and other CNS depressants that may worsen sleep apnea and disrupt normal sleep architecture. - Sleep hygiene should be reviewed to assess factors that may improve sleep quality. - Weight management and regular exercise should be initiated or continued if appropriate.  [Electronically signed] 06/27/2017 12:01 PM  Baird Lyons MD, [place], American Board of Sleep Medicine   NPI: [id number]                        [place], [place] [facility]  ELECTRONICALLY SIGNED ON:  06/27/2017, 11:55 AM [place] PH: [phone]   FX: (336) [place]  ACADEMY OF SLEEP MEDICINE

## 2017-06-28 ENCOUNTER — Encounter: Payer: Medicare Other | Admitting: Family Medicine

## 2017-06-29 ENCOUNTER — Other Ambulatory Visit (HOSPITAL_BASED_OUTPATIENT_CLINIC_OR_DEPARTMENT_OTHER): Payer: Self-pay

## 2017-06-29 DIAGNOSIS — G4733 Obstructive sleep apnea (adult) (pediatric): Secondary | ICD-10-CM

## 2017-06-30 DIAGNOSIS — M542 Cervicalgia: Secondary | ICD-10-CM | POA: Diagnosis not present

## 2017-06-30 DIAGNOSIS — R260 Ataxic gait: Secondary | ICD-10-CM | POA: Diagnosis not present

## 2017-06-30 DIAGNOSIS — E119 Type 2 diabetes mellitus without complications: Secondary | ICD-10-CM | POA: Diagnosis not present

## 2017-06-30 DIAGNOSIS — M5440 Lumbago with sciatica, unspecified side: Secondary | ICD-10-CM | POA: Diagnosis not present

## 2017-06-30 DIAGNOSIS — G4733 Obstructive sleep apnea (adult) (pediatric): Secondary | ICD-10-CM | POA: Diagnosis not present

## 2017-06-30 DIAGNOSIS — G8929 Other chronic pain: Secondary | ICD-10-CM | POA: Diagnosis not present

## 2017-07-05 ENCOUNTER — Telehealth: Payer: Self-pay | Admitting: Neurology

## 2017-07-05 ENCOUNTER — Ambulatory Visit (INDEPENDENT_AMBULATORY_CARE_PROVIDER_SITE_OTHER): Payer: Self-pay

## 2017-07-05 ENCOUNTER — Ambulatory Visit (INDEPENDENT_AMBULATORY_CARE_PROVIDER_SITE_OTHER): Payer: Medicare Other | Admitting: Physical Medicine and Rehabilitation

## 2017-07-05 ENCOUNTER — Encounter (INDEPENDENT_AMBULATORY_CARE_PROVIDER_SITE_OTHER): Payer: Self-pay | Admitting: Physical Medicine and Rehabilitation

## 2017-07-05 VITALS — BP 116/60 | HR 72 | Temp 98.3°F

## 2017-07-05 DIAGNOSIS — M5412 Radiculopathy, cervical region: Secondary | ICD-10-CM

## 2017-07-05 DIAGNOSIS — M5010 Cervical disc disorder with radiculopathy, unspecified cervical region: Secondary | ICD-10-CM

## 2017-07-05 MED ORDER — METHYLPREDNISOLONE ACETATE 80 MG/ML IJ SUSP
80.0000 mg | Freq: Once | INTRAMUSCULAR | Status: AC
  Administered 2017-07-05: 80 mg

## 2017-07-05 MED ORDER — LIDOCAINE HCL (PF) 1 % IJ SOLN
2.0000 mL | Freq: Once | INTRAMUSCULAR | Status: AC
  Administered 2017-07-05: 2 mL

## 2017-07-05 NOTE — Telephone Encounter (Signed)
Returned [facility]. LVM with verbal order to extend PT by one more visit for discharge to make up for missed appt last week. Left office number to call back if she has any questions.

## 2017-07-05 NOTE — Progress Notes (Deleted)
Pt states she has pain all over her back and shoulders. Numbness in her right and left arms that radiates in her fingers on both hands. -Dye Allergies, -BT, +Driver.

## 2017-07-05 NOTE — Telephone Encounter (Signed)
Physical Therapist Junie Panning with [facility] calling for orders: Patient had missed visit from Friday of last week because of Dr Appointment, requesting verbal order for PT for this week for discharge to make up for appointment missed last week . PT Junie Panning can be reached at [phone]

## 2017-07-05 NOTE — Patient Instructions (Signed)

## 2017-07-07 DIAGNOSIS — G4733 Obstructive sleep apnea (adult) (pediatric): Secondary | ICD-10-CM | POA: Diagnosis not present

## 2017-07-07 DIAGNOSIS — E119 Type 2 diabetes mellitus without complications: Secondary | ICD-10-CM | POA: Diagnosis not present

## 2017-07-07 DIAGNOSIS — M542 Cervicalgia: Secondary | ICD-10-CM | POA: Diagnosis not present

## 2017-07-07 DIAGNOSIS — G8929 Other chronic pain: Secondary | ICD-10-CM | POA: Diagnosis not present

## 2017-07-07 DIAGNOSIS — M5440 Lumbago with sciatica, unspecified side: Secondary | ICD-10-CM | POA: Diagnosis not present

## 2017-07-07 DIAGNOSIS — R260 Ataxic gait: Secondary | ICD-10-CM | POA: Diagnosis not present

## 2017-07-19 NOTE — Procedures (Signed)
[name] is a 76 year old right-hand-dominant female who have seen in the past through Dr. Paulla Fore for lumbar epidural injection.  She comes in today with a history per his notes of mostly right neck and shoulder pain.  She tells [place] today though that she has pain all over her back and shoulders bilaterally but with numbness in her right and left arms that radiated to both fingers in both hands in a nondermatomal fashion.  She is also followed by Dr. Lavell Anchors in neurology.  She is complicated patient with cardiac issues and pacemaker.  She evidently has a MRI conditional pacemaker.  CT scan of the cervical spine was reviewed.  She has mainly facet arthropathy with foraminal narrowing particularly at C3-4.  No central stenosis.  No frank nerve compression.  We will complete diagnostic and hopefully therapeutic cervical epidural injection today.  Consideration should be given to facet joint block continued physical therapy with dry needling.  The injection  will be diagnostic and hopefully therapeutic. The patient has failed conservative care including time, medications and activity modification.  Cervical Epidural Steroid Injection - Interlaminar Approach with Fluoroscopic Guidance  Patient: Kasheena Sambrano      Date of Birth: [birth year] MRN: [id number] PCP: Briscoe Deutscher, DO      Visit Date: 07/05/2017   Universal Protocol:    Date/Time: 12/31/186:41 AM  Consent Given By: the patient  Position: PRONE  Additional Comments: Vital signs were monitored before and after the procedure. Patient was prepped and draped in the usual sterile fashion. The correct patient, procedure, and site was verified.   Injection Procedure Details:  Procedure Site One Meds Administered:  Meds ordered this encounter  Medications  . lidocaine (PF) (XYLOCAINE) 1 % injection 2 mL  . methylPREDNISolone acetate (DEPO-MEDROL) injection 80 mg     Laterality: Right  Location/Site:  C7-T1  Needle size: 20  G  Needle type: Touhy  Needle Placement: Paramedian epidural space  Findings:  -Comments: Excellent flow of contrast into the epidural space.  Procedure Details: Using a paramedian approach from the side mentioned above, the region overlying the inferior lamina was localized under fluoroscopic visualization and the soft tissues overlying this structure were infiltrated with 4 ml. of 1% Lidocaine without Epinephrine. A # 20 gauge, Tuohy needle was inserted into the epidural space using a paramedian approach.  The epidural space was localized using loss of resistance along with lateral and contralateral oblique bi-planar fluoroscopic views.  After negative aspirate for air, blood, and CSF, a 2 ml. volume of Isovue-250 was injected into the epidural space and the flow of contrast was observed. Radiographs were obtained for documentation purposes.   The injectate was administered into the level noted above.  Additional Comments:  The patient tolerated the procedure well No complications occurred Dressing: Band-Aid    Post-procedure details: Patient was observed during the procedure. Post-procedure instructions were reviewed.  Patient left the clinic in stable condition.

## 2017-08-03 ENCOUNTER — Encounter: Payer: Self-pay | Admitting: Family Medicine

## 2017-08-03 ENCOUNTER — Ambulatory Visit (INDEPENDENT_AMBULATORY_CARE_PROVIDER_SITE_OTHER): Payer: Medicare Other | Admitting: Family Medicine

## 2017-08-03 VITALS — BP 130/80 | HR 86 | Temp 97.8°F | Wt 176.6 lb

## 2017-08-03 DIAGNOSIS — G4700 Insomnia, unspecified: Secondary | ICD-10-CM | POA: Diagnosis not present

## 2017-08-03 DIAGNOSIS — G4733 Obstructive sleep apnea (adult) (pediatric): Secondary | ICD-10-CM | POA: Diagnosis not present

## 2017-08-03 DIAGNOSIS — F339 Major depressive disorder, recurrent, unspecified: Secondary | ICD-10-CM

## 2017-08-03 MED ORDER — FLUOXETINE HCL 20 MG PO CAPS: 20.0000 mg | ORAL_CAPSULE | Freq: Every morning | ORAL | 3 refills | Status: DC

## 2017-08-03 NOTE — Progress Notes (Signed)
Heather [name] is a 77 y.o. female is here for follow up.  History of Present Illness:   Heather [name], CMA acting as scribe for Dr. Briscoe Deutscher.   HPI   Sleeping: Patient is taking gabapentin and trazadon with no help with sleeping.   Sleep study: Never received results from study done.   Health Maintenance Due  Topic Date Due  . OPHTHALMOLOGY EXAM  01/01/1950  . TETANUS/TDAP  01/02/1959  . DEXA SCAN  01/01/2005  . HEMOGLOBIN A1C  05/23/2016  . FOOT EXAM  09/15/2016  . PNA vac Low Risk Adult (2 of 2 - PCV13) 05/27/2017   Depression screen [facility] 2/9 08/03/2017 05/27/2016 05/06/2016  Decreased Interest 0 0 0  Down, Depressed, Hopeless 0 0 0  PHQ - 2 Score 0 0 0  Altered sleeping - 1 0  Tired, decreased energy - 0 0  Change in appetite - 0 0  Feeling bad or failure about yourself  - 0 0  Trouble concentrating - 0 0  Moving slowly or fidgety/restless - 0 0  Suicidal thoughts - 0 0  PHQ-9 Score - 1 0   PMHx, SurgHx, SocialHx, FamHx, Medications, and Allergies were reviewed in the Visit Navigator and updated as appropriate.   Patient Active Problem List   Diagnosis Date Noted  . Osteoarthritis of cervical spine 06/04/2017  . Imbalance 05/30/2017  . Controlled type 2 diabetes mellitus with chronic kidney disease, without long-term current use of insulin ([place]) 04/27/2017  . Scoliosis of lumbar spine 06/02/2016  . Complete tear of right rotator cuff 06/02/2016  . Neurogenic claudication due to lumbar spinal stenosis 05/06/2016  . Right shoulder pain 05/06/2016  . Sjogren's syndrome ([place]) 04/27/2016  . Xerostomia 04/27/2016  . Loss of hair 11/21/2015  . Postmenopausal atrophic vaginitis 09/16/2015  . CKD (chronic kidney disease) stage 4, GFR 15-29 ml/min (HCC) 05/06/2015  . Bilateral hip pain 05/06/2015  . Seborrheic keratoses 05/06/2015  . 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 Crohn's disease ([place]) 02/16/2014  . Prediabetes 01/29/2014  . OSA (obstructive sleep apnea)  01/29/2014  . Pulmonary hypertension ([place]) 01/29/2014  . CAD (coronary artery disease) 01/29/2014  . Hypertension 01/29/2014  . Diastolic CHF ([place]) 50/93/2671   Social History   Tobacco Use  . Smoking status: Never Smoker  . Smokeless tobacco: Never Used  Substance Use Topics  . Alcohol use: No  . Drug use: No   Current Medications and Allergies:   .  albuterol (PROVENTIL HFA;VENTOLIN HFA) 108 (90 Base) MCG/ACT inhaler, Inhale 2 puffs into the lungs every 6 (six) hours as needed for wheezing or shortness of breath., Disp: , Rfl:  .  amLODipine (NORVASC) 5 MG tablet, Take 1 tablet (5 mg total) by mouth daily., Disp: 90 tablet, Rfl: 1 .  aspirin 81 MG EC tablet, Take 1 tablet (81 mg total) by mouth daily., Disp: 30 tablet, Rfl: 5 .  carvedilol (COREG) 6.25 MG tablet, TAKE 1 TABLET BY MOUTH 2 TIMES DAILY WITH A MEAL., Disp: 180 tablet, Rfl: 3 .  fluticasone (FLOVENT HFA) 220 MCG/ACT inhaler, Inhale 1 puff into the lungs 2 (two) times daily., Disp: 1 Inhaler, Rfl: 12 .  furosemide (LASIX) 40 MG tablet, Take one tablet (40 mg) by mouth once daily as needed for swelling/ shortness of breath, Disp: , Rfl:  .  gabapentin (NEURONTIN) 100 MG capsule, Take 2 capsules (200 mg total) 2 (two) times daily by mouth., Disp: 120 capsule, Rfl: 3 .  hydrochlorothiazide (MICROZIDE) 12.5 MG capsule,  Take 1 capsule (12.5 mg total) by mouth daily., Disp: 30 capsule, Rfl: 11 .  losartan (COZAAR) 50 MG tablet, Take 1 tablet (50 mg total) by mouth daily., Disp: 90 tablet, Rfl: 3 .  oxybutynin (DITROPAN-XL) 10 MG 24 hr tablet, Take 10 mg by mouth at bedtime., Disp: , Rfl:  .  pantoprazole (PROTONIX) 40 MG tablet, Take 1 tablet (40 mg total) by mouth daily., Disp: 90 tablet, Rfl: 3 .  Polyethyl Glycol-Propyl Glycol (SYSTANE) 0.4-0.3 % SOLN, Apply 2 drops to eye as needed., Disp: 10 mL, Rfl: 0 .  sodium chloride (OCEAN) 0.65 % SOLN nasal spray, Place 1 spray into both nostrils as needed (dry nose)., Disp: 30 mL, Rfl:  0 .  sucralfate (CARAFATE) 1 g tablet, Take 1 tablet four times daily - with meals and at bedtime, Disp: 120 tablet, Rfl: 1 .  traMADol (ULTRAM) 50 MG tablet, Take 1 tablet (50 mg total) by mouth 2 (two) times daily., Disp: 60 tablet, Rfl: 0 .  traZODone (DESYREL) 50 MG tablet, Take 1/2 to 1 tablet at bedtime as needed for sleep, Disp: 30 tablet, Rfl: 3  Allergies  Allergen Reactions  . Amoxicillin-Pot Clavulanate Swelling and Rash   Review of Systems   Pertinent items are noted in the HPI. Otherwise, ROS is negative.  Vitals:   Vitals:   08/03/17 1446  BP: 130/80  Pulse: 86  Temp: 97.8 F (36.6 C)  TempSrc: Oral  SpO2: 96%  Weight: 176 lb 9.6 oz (80.1 kg)     Body mass index is 32.3 kg/m.  Physical Exam:   Physical Exam  Constitutional: She appears well-nourished.  HENT:  Head: Normocephalic and atraumatic.  Eyes: EOM are normal. Pupils are equal, round, and reactive to light.  Neck: Normal range of motion. Neck supple.  Cardiovascular: Normal rate, regular rhythm, normal heart sounds and intact distal pulses.  Pulmonary/Chest: Effort normal.  Abdominal: Soft.  Skin: Skin is warm.  Psychiatric: She has a normal mood and affect. Her behavior is normal.  Nursing note and vitals reviewed.   Assessment and Plan:   1. Insomnia, unspecified type Adding Prozac as below, ordering CPAP.  2. OSA (obstructive sleep apnea) CPAP supplies ordered.   3. Depression, recurrent ([place]) NEW.   Depression symptoms: depressed mood, change in sleep, trouble concentrating Current psychosocial stressors include: family issues.   Treatment to date has included None.  Symptoms have been unchanged since onset of treatment.    Patient denies current suicidal and homicidal ideation.  - FLUoxetine (PROZAC) 20 MG capsule; Take 1 capsule (20 mg total) by mouth every morning.  Dispense: 90 capsule; Refill: 3  Paperwork completed today for daughter to be caregiver.   . Reviewed  expectations re: course of current medical issues. . Discussed self-management of symptoms. . Outlined signs and symptoms indicating need for more acute intervention. . Patient verbalized understanding and all questions were answered. Marland Kitchen Health Maintenance issues including appropriate healthy diet, exercise, and smoking avoidance were discussed with patient. . See orders for this visit as documented in the electronic medical record. . Patient received an After Visit Summary.  CMA served as Education administrator during this visit. History, Physical, and Plan performed by medical provider. The above documentation has been reviewed and is accurate and complete. Briscoe Deutscher, D.O.  Briscoe Deutscher, DO [place], [place] 08/08/2017  Records requested if needed. Time spent with the patient: 30 minutes, of which >50% was spent in obtaining information about her symptoms, reviewing her previous  labs, evaluations, and treatments, counseling her about her condition (please see the discussed topics above), and developing a plan to further investigate it; she had a number of questions which I addressed.

## 2017-08-12 ENCOUNTER — Ambulatory Visit: Payer: Medicare Other | Attending: Family Medicine | Admitting: Audiology

## 2017-08-12 DIAGNOSIS — H903 Sensorineural hearing loss, bilateral: Secondary | ICD-10-CM | POA: Insufficient documentation

## 2017-08-12 DIAGNOSIS — G8929 Other chronic pain: Secondary | ICD-10-CM | POA: Diagnosis not present

## 2017-08-12 DIAGNOSIS — H918X9 Other specified hearing loss, unspecified ear: Secondary | ICD-10-CM | POA: Diagnosis not present

## 2017-08-12 DIAGNOSIS — H93213 Auditory recruitment, bilateral: Secondary | ICD-10-CM | POA: Diagnosis not present

## 2017-08-12 DIAGNOSIS — H9203 Otalgia, bilateral: Secondary | ICD-10-CM | POA: Insufficient documentation

## 2017-08-12 DIAGNOSIS — H93299 Other abnormal auditory perceptions, unspecified ear: Secondary | ICD-10-CM | POA: Insufficient documentation

## 2017-08-12 DIAGNOSIS — H9190 Unspecified hearing loss, unspecified ear: Secondary | ICD-10-CM

## 2017-08-12 NOTE — Procedures (Signed)
Outpatient Audiology and [place], [place] [address] [phone]   NAME: Heather [name]  STATUS:  Outpatient DOB:    1940/08/30  DIAGNOSIS:  Audiological Evaluation  MRN:   [id number]                                                                                      DATE:  08/12/2017  REFERRAL:  Briscoe Deutscher, DO   CASE HISTORY Ismahan was seen for an audiological evaluation.  Sameera Leeann Must was accompanied by daughter and Spanish interpeter.  Chanita reported concerns with "pain in ears, the left more than the right ear" for "about 6 months".  No ear infections reports.  She denies "sinus or allergy issues".   Primary Concern: "Pain in the ears and hearing loss".  In [place] "the doctor would give some drops for the pain" and would feel a little better with thte drops.   Pain: None History of hearing problems: Y - "sometimes, doesn't hear anything. Other days hears a little" this started about 6 months ago. History of ear infections:  N History of ear surgery or "tubes" : N History of dizziness/vertigo:    N History of balance issues:  Y - unsteady, but doesn't feel like she is going to fall.  Tinnitus: Y - "little chirping" - always has tinnitus, "sometimes louder than others". Sound sensitivity: Y - "upset by loud music" or sudden loud sounds. History of occupational noise exposure: N History of hypertension: Y - controlled by medication. History of diabetes:  N Other concerns: Has "pacemaker" - "three years ago".    SUMMARY Audiological evaluation show symmetrical hearing thresholds that range from mild in the low frequencies to moderately severe in the high frequencies to a severe response at 8000Hz  bilaterally. 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  Of concern is that even though hearing loss is symmetrical, word recognition is fair on the left but is excellent on the right side in quiet. Middle ear function is within normal  limits bilaterally. Baird Lyons needs referral to an ENT for a) bilateral ear pain  b) unsteadiness c) poorer word recognition on the left side with symmetrical hearing loss and the bilateral mild to moderately severe hearing loss.  Janey also needs a hearing aid evaluation. Marketa is a candidate for binaural amplification with medical clearance.  AUDIOLOGICAL EVALUATION Otoscopic inspection was within normal limits, without redness, bilaterally. Baird Lyons needed to be reminded to raise her hand every time she heard a sound even if very soft.  Tympanometry shows normal middle ear volume, pressure and compliance bilaterally (Type A) with acoustic refelexes of 90-95 dB bilaterally from 500hz  - 8000Hz .   Pure tone air and bone conduction testing showed a sensorineural hearing loss that ranged from 35 dBHL from 250Hz  -1000Hz ; 45 dBHL at 2000Hz ; 55-60 dBHL from 3000Hz  - 6000Hz  and 70-75 dBHL at 8000Hz  bilaterally. Speech reception thresholds were 35/40 dB HL on the left and 40 dB HL on the right using recorded Spanish spondee word lists.  Word recognition testing was 70% at 75 dB HL on the left and 100% at 75 dB HL  on the right using recorded Spanish word lists in quiet at [facility].   A referral was completed for an amplified telephone through caption call at patients request.    RECOMMENDATIONS 1)  Referral to an ENT for a) bilateral ear pain  b) unsteadiness c) poorer word recognition on the left side with symmetircal hearing loss and the bilateral mild to moderately severe hearing loss.   2)  A hearing aid evaluation.   3) Referral for a balance assessment. Arsema was very unsteady following the audiological evaluation and needed to sit before walking to the car, while holding onto her daughters arm.   Deborah L. Heide Spark, AuD, [employer]

## 2017-08-13 ENCOUNTER — Ambulatory Visit: Payer: Medicare Other | Admitting: Family Medicine

## 2017-08-19 ENCOUNTER — Other Ambulatory Visit: Payer: Self-pay | Admitting: Family Medicine

## 2017-08-19 ENCOUNTER — Other Ambulatory Visit: Payer: Self-pay | Admitting: Cardiology

## 2017-08-19 DIAGNOSIS — M25552 Pain in left hip: Secondary | ICD-10-CM

## 2017-08-23 ENCOUNTER — Other Ambulatory Visit: Payer: Self-pay | Admitting: Cardiology

## 2017-08-23 MED ORDER — FUROSEMIDE 40 MG PO TABS: ORAL_TABLET | ORAL | 0 refills | Status: DC

## 2017-08-24 ENCOUNTER — Other Ambulatory Visit: Payer: Self-pay

## 2017-08-24 MED ORDER — OSELTAMIVIR PHOSPHATE 75 MG PO CAPS: 75.0000 mg | ORAL_CAPSULE | Freq: Two times a day (BID) | ORAL | 0 refills | Status: DC

## 2017-08-24 NOTE — Telephone Encounter (Signed)
Ok to refill 

## 2017-08-26 DIAGNOSIS — N183 Chronic kidney disease, stage 3 (moderate): Secondary | ICD-10-CM | POA: Diagnosis not present

## 2017-08-26 DIAGNOSIS — I129 Hypertensive chronic kidney disease with stage 1 through stage 4 chronic kidney disease, or unspecified chronic kidney disease: Secondary | ICD-10-CM | POA: Diagnosis not present

## 2017-09-06 ENCOUNTER — Other Ambulatory Visit: Payer: Self-pay

## 2017-09-06 MED ORDER — HYDROCHLOROTHIAZIDE 12.5 MG PO CAPS: 12.5000 mg | ORAL_CAPSULE | Freq: Every day | ORAL | 7 refills | Status: DC

## 2017-09-14 ENCOUNTER — Ambulatory Visit (INDEPENDENT_AMBULATORY_CARE_PROVIDER_SITE_OTHER): Payer: Medicare Other | Admitting: *Deleted

## 2017-09-14 DIAGNOSIS — I495 Sick sinus syndrome: Secondary | ICD-10-CM | POA: Diagnosis not present

## 2017-09-14 NOTE — Progress Notes (Signed)
Remote pacemaker transmission.   

## 2017-09-16 ENCOUNTER — Encounter: Payer: Self-pay | Admitting: Cardiology

## 2017-09-28 NOTE — Progress Notes (Signed)
[facility]  PATIENT: Heather [name] DOB: [birth year]   REASON FOR VISIT: Follow-up for imbalance obstructive sleep apnea HISTORY FROM: Patient and daughter who interprets, patient does not speak English    HISTORY OF PRESENT ILLNESS: UPDATE 3/13/2019CM Heather [name], 77 year old female returns for follow-up with a history of imbalance but no falls.  She also has a history of chronic low back pain sciatica pacemaker hypertension obstructive sleep apnea.C-spine shows arthritis in the neck spinal cord  was normal not a reason for her imbalance.  Daughter reports today that she now has started using her CPAP again and her memory is much improved and she is sleeping during the night without nocturia.  She had physical therapy after her last visit however she does not do her home exercise program.  She is very sedentary.  No other episodes of talking incoherently and confusion.  MMSE today 22 out of 30 missed 1 of 3 recall.  Patient no longer cooks she does not drive.  She lives with her daughter.  She returns for reevaluation.  Last injection to low back was in December. Labs for treatable causes of memory loss returned normal.   11/9/18AARosa Leeann [name] is a 77 y.o. female here as a referral from Dr. Juleen China for imbalance, no falls but when walks she goes to the side.PMHx chronic low back pain and sciatica,pacemaker, pulm HTN, OSA on cpap, migraine, HTN, DM2, CHF.   Here with daughter and interpreter. Started 6 months ago, getting worse. Now she goes to both sides, worsening. No focal weakness, diplopia, facial drooping, problems swallowing, numbness or tingling. Her head feels heavy and tired. She has neck pain chronically. Daughter provides much information, 2 months ago they had a disagreement and mother went to sleep and the next day she had a very bad headache, she started to talk incoherently, patient seemed confused and talked about her dead mother no ongoing hallucinations,  she also feels her memory is worsening. More short term memory, she can remember things from long ago. She is not driving, never did it, she lives with daughter, daughter helps with paying bills but patient manages her own medications. Patient doesn't cook, doesn't cook well anymore. She doesn't know her family so unclear if there is a hx of memory loss. Memory loss started after the fight with her daughter, just 2 months ago, not progressing. More irritable, more impatient, she forgets people's names. In the last year memory impaired, not 2 months ago. She does not exercise or practice balance.She has low back pain, she had shots in her back, she has radiation of the right leg into the toe. Daughter says she sits in a chair mostly all day. No other focal neurologic deficits, associated symptoms, inciting events or modifiable factors.     REVIEW OF SYSTEMS: Full 14 system review of systems performed and notable only for those listed, all others are neg:  Constitutional: neg  Cardiovascular: neg Ear/Nose/Throat: neg  Skin: neg Eyes: neg Respiratory: neg Gastroitestinal: neg  Hematology/Lymphatic: neg  Endocrine: neg Musculoskeletal:neg Allergy/Immunology: neg Neurological: neg Psychiatric: neg Sleep : Obstructive sleep apnea now using CPAP ALLERGIES: Allergies  Allergen Reactions  . Amoxicillin-Pot Clavulanate Swelling and Rash    HOME MEDICATIONS: Outpatient Medications Prior to Visit  Medication Sig Dispense Refill  . albuterol (PROVENTIL HFA;VENTOLIN HFA) 108 (90 Base) MCG/ACT inhaler Inhale 2 puffs into the lungs every 6 (six) hours as needed for wheezing or shortness of breath.    Marland Kitchen amLODipine (NORVASC) 5 MG  tablet TAKE ONE TABLET BY MOUTH DAILY 90 tablet 2  . aspirin 81 MG EC tablet Take 1 tablet (81 mg total) by mouth daily. 30 tablet 5  . carvedilol (COREG) 6.25 MG tablet TAKE ONE TABLET BY MOUTH TWICE DAILY WITH A MEAL 180 tablet 2  . FLUoxetine (PROZAC) 20 MG capsule Take  1 capsule (20 mg total) by mouth every morning. 90 capsule 3  . fluticasone (FLOVENT HFA) 220 MCG/ACT inhaler Inhale 1 puff into the lungs 2 (two) times daily. 1 Inhaler 12  . furosemide (LASIX) 40 MG tablet Take one tablet (40 mg) by mouth once daily as needed for swelling/ shortness of breath. Please make appt with Dr.Skains. 1st attempt 30 tablet 0  . gabapentin (NEURONTIN) 100 MG capsule Take 2 capsules (200 mg total) 2 (two) times daily by mouth. 120 capsule 3  . hydrochlorothiazide (MICROZIDE) 12.5 MG capsule Take 1 capsule (12.5 mg total) by mouth daily. 30 capsule 7  . losartan (COZAAR) 50 MG tablet TAKE ONE TABLET BY MOUTH DAILY 90 tablet 2  . oseltamivir (TAMIFLU) 75 MG capsule Take 1 capsule (75 mg total) by mouth 2 (two) times daily. 10 capsule 0  . oxybutynin (DITROPAN-XL) 10 MG 24 hr tablet Take 10 mg by mouth at bedtime.    . pantoprazole (PROTONIX) 40 MG tablet Take 1 tablet (40 mg total) by mouth daily. 90 tablet 3  . Polyethyl Glycol-Propyl Glycol (SYSTANE) 0.4-0.3 % SOLN Apply 2 drops to eye as needed. 10 mL 0  . sodium chloride (OCEAN) 0.65 % SOLN nasal spray Place 1 spray into both nostrils as needed (dry nose). 30 mL 0  . sucralfate (CARAFATE) 1 g tablet Take 1 tablet four times daily - with meals and at bedtime 120 tablet 1  . traMADol (ULTRAM) 50 MG tablet TAKE 1 TABLET BY MOUTH TWICE DAILY 60 tablet 0  . traZODone (DESYREL) 50 MG tablet Take 1/2 to 1 tablet at bedtime as needed for sleep 30 tablet 3   No facility-administered medications prior to visit.     PAST MEDICAL HISTORY: Past Medical History:  Diagnosis Date  . CHF (congestive heart failure) ([place])   . Chronic back pain   . Crohn's disease ([place])   . GERD (gastroesophageal reflux disease)   . High cholesterol   . History of diabetes mellitus, type II   . History of echocardiogram    Echo 12/16: EF 60-65%, no RWMA, Gr 2 DD, mild AS (mean 13 mmHg), mild AI, MAC, trivial MS, mild MR, mild LAE, mild RVE, mild to  mod TR, PASP 36 mmHg  . Hypertension   . Migraine    "1-2 times/yr" (02/16/2014)  . Murmur, cardiac   . OSA on CPAP   . 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 Pulmonary hypertension ([place])   . Sinus bradycardia 01/29/2014  . Skin cancer 2003   "3 cut off face" (02/16/2014)    PAST SURGICAL HISTORY: Past Surgical History:  Procedure Laterality Date  . CARDIAC CATHETERIZATION  1995; 2005  . CATARACT EXTRACTION Bilateral 1982  . ESOPHAGOGASTRODUODENOSCOPY (EGD) WITH PROPOFOL N/A 12/28/2016   Procedure: ESOPHAGOGASTRODUODENOSCOPY (EGD) WITH PROPOFOL;  Surgeon: Otis Brace, MD;  Location: [facility];  Service: Gastroenterology;  Laterality: N/A;  . INSERT / REPLACE / REMOVE PACEMAKER  02/14/2014  . PERMANENT PACEMAKER INSERTION N/A 02/14/2014   Procedure: PERMANENT PACEMAKER INSERTION;  Surgeon: Deboraha Sprang, MD;  Location: [facility] CATH LAB;  Service: Cardiovascular;  Laterality: N/A;  . SKIN CANCER EXCISION  2003    "  face X 3" (02/16/2014)  . TEMPORARY PACEMAKER INSERTION N/A 02/12/2014   Procedure: TEMPORARY PACEMAKER INSERTION;  Surgeon: Lorretta Harp, MD;  Location: [facility] C[facility] LAB;  Service: Cardiovascular;  Laterality: N/A;  . VAGINAL HYSTERECTOMY  1972    FAMILY HISTORY: Family History  Adopted: Yes  Problem Relation Age of Onset  . Hypertension Mother   . Heart disease Mother     SOCIAL HISTORY: Social History   Socioeconomic History  . Marital status: Widowed    Spouse name: Not on file  . Number of children: 5  . Years of education: middle school  . Highest education level: Not on file  Social Needs  . Financial resource strain: Not on file  . Food insecurity - worry: Not on file  . Food insecurity - inability: Not on file  . Transportation needs - medical: Not on file  . Transportation needs - non-medical: Not on file  Occupational History  . Not on file  Tobacco Use  . Smoking status: Never Smoker  . Smokeless tobacco: Never Used  Substance and Sexual Activity    . Alcohol use: No  . Drug use: No  . Sexual activity: No  Other Topics Concern  . Not on file  Social History Narrative   Moved here from [place] in October 2013.   Right handed   1 cup of caffeine daily     PHYSICAL EXAM  Vitals:   09/29/17 1105  BP: (!) 142/77  Pulse: 83  Weight: 180 lb (81.6 kg)   Body mass index is 32.92 kg/m.  Generalized: Well developed, in no acute distress  Head: normocephalic and atraumatic,. Oropharynx benign  Neck: Supple, no carotid bruits  Cardiac: Regular rate rhythm, no murmur  Musculoskeletal: No deformity   Neurological examination   Mentation: Alert MMSE 22 out of 30 missing items in orientation calculation 1 of 3 recall.  AST 12.  Patient does not speak [place]  Follows all commands speech and language fluent.   Cranial nerve II-XII: .Pupils were equal round reactive to light extraocular movements were full, visual field were full on confrontational test. Facial sensation and strength were normal. hearing was intact to finger rubbing bilaterally. Uvula tongue midline. head turning and shoulder shrug were normal and symmetric.Tongue protrusion into cheek strength was normal. Motor: normal bulk and tone, poor effort on exam, appears symmetric Sensory: normal and symmetric to light touch, pinprick, and decreased vibration to lower extremities  Coordination: finger-nose-finger, heel-to-shin bilaterally, no dysmetria Reflexes: Brachioradialis 2/2, biceps 2/2, triceps 2/2, patellar 2/2, Achilles absent, plantar responses were flexor bilaterally. Gait and Station: Rising up from seated position with push off,  normal stance,  moderate stride, good arm swing, smooth turning, able to perform tiptoe, and heel walking. Tandem gait is unsteady  DIAGNOSTIC DATA (LABS, IMAGING, TESTING) - I reviewed patient records, labs, notes, testing and imaging myself where available.  Lab Results  Component Value Date   WBC 6.7 04/26/2017   HGB 15.5 (H)  04/26/2017   HCT 47.5 (H) 04/26/2017   MCV 90.3 04/26/2017   PLT 231.0 04/26/2017      Component Value Date/Time   NA 137 04/26/2017 0916   NA 146 (H) 07/31/2016 1232   K 3.4 (L) 04/26/2017 0916   CL 96 04/26/2017 0916   CO2 32 04/26/2017 0916   GLUCOSE 109 (H) 04/26/2017 0916   BUN 23 04/26/2017 0916   BUN 34 (H) 07/31/2016 1232   CREATININE 1.64 (H) 04/26/2017 0916   CREATININE  1.64 (H) 11/21/2015 1456   CALCIUM 9.6 04/26/2017 0916   PROT 8.0 04/26/2017 0916   ALBUMIN 4.0 04/26/2017 0916   AST 20 04/26/2017 0916   ALT 16 04/26/2017 0916   ALKPHOS 85 04/26/2017 0916   BILITOT 0.5 04/26/2017 0916   GFRNONAA 30 (L) 11/25/2016 1426   GFRNONAA 30 (L) 11/21/2015 1456   GFRAA 34 (L) 11/25/2016 1426   GFRAA 35 (L) 11/21/2015 1456   Lab Results  Component Value Date   CHOL 191 03/09/2017   HDL 52.10 03/09/2017   LDLCALC 122 (H) 03/09/2017   TRIG 88.0 03/09/2017   CHOLHDL 4 03/09/2017   Lab Results  Component Value Date   HGBA1C 5.7 11/21/2015   Lab Results  Component Value Date   DPTELMRA15 183 05/28/2017   Lab Results  Component Value Date   TSH 1.39 04/26/2017      ASSESSMENT AND PLAN 77 y.o. female here as a referral from Dr. Juleen China for imbalance, no falls but when walks she goes to the side.PMHx chronic low back pain and sciatica,pacemaker, pulm HTN, OSA on cpap, migraine, HTN, DM2, CHF. CT of the neck with arthritis spinal cord normal.  Labs for treatable causes of memory loss minimal.  Daughter reports that patient has been consistent with her CPAP her memory has improved and she is sleeping through the night.  Physical therapy has concluded patient does not do exercise program   Imbalance: Likely multifactorial  Including chronic low back pain with sciatica, normal aging, disuse and life style (patient sits in a chair all day), mechanical (assymmetry of shoulders).  Does not perform home exercise program.  CT of the neck with arthritis only no stenosis    Memory loss:  Since she is using CPAP nightly memory loss has improved and she is now sleeping through the night Again likely multifactorial including normal cognitive aging, polypharmacy, chronic illnesses and chronic pain.  Will follow clinically. Will hold off on meds for now. Side effects in this patient with hx of bradycardia.    PLAN: Continue using CPAP every night Now that therapy is over continue to exercise by walking every day join Silver sneakers CT of the neck only showed arthritis no problem with spinal cord Suggest she use a cane for safe ambulation Follow-up in 8 months Dennie Bible, [facility], [facility], [facility] [address] [place], [place] [address] [phone]

## 2017-09-29 ENCOUNTER — Ambulatory Visit (INDEPENDENT_AMBULATORY_CARE_PROVIDER_SITE_OTHER): Payer: Medicare Other | Admitting: Nurse Practitioner

## 2017-09-29 ENCOUNTER — Encounter: Payer: Self-pay | Admitting: Nurse Practitioner

## 2017-09-29 VITALS — BP 142/77 | HR 83 | Wt 180.0 lb

## 2017-09-29 DIAGNOSIS — Z9989 Dependence on other enabling machines and devices: Secondary | ICD-10-CM | POA: Diagnosis not present

## 2017-09-29 DIAGNOSIS — R2689 Other abnormalities of gait and mobility: Secondary | ICD-10-CM | POA: Diagnosis not present

## 2017-09-29 DIAGNOSIS — G4733 Obstructive sleep apnea (adult) (pediatric): Secondary | ICD-10-CM | POA: Diagnosis not present

## 2017-09-29 DIAGNOSIS — G3184 Mild cognitive impairment, so stated: Secondary | ICD-10-CM

## 2017-09-29 LAB — CUP PACEART REMOTE DEVICE CHECK
Battery Remaining Longevity: 81 mo
Battery Voltage: 3.01 V
Brady Statistic AP VP Percent: 0.03 %
Brady Statistic AP VS Percent: 91.18 %
Brady Statistic AS VP Percent: 0.01 %
Brady Statistic AS VS Percent: 8.78 %
Brady Statistic RA Percent Paced: 90.94 %
Brady Statistic RV Percent Paced: 0.04 %
Date Time Interrogation Session: 20190226141308
Implantable Lead Implant Date: 20150729
Implantable Lead Implant Date: 20150729
Implantable Lead Location: 753859
Implantable Lead Location: 753860
Implantable Lead Model: 5076
Implantable Lead Model: 5076
Implantable Pulse Generator Implant Date: 20150729
Lead Channel Impedance Value: 361 Ohm
Lead Channel Impedance Value: 494 Ohm
Lead Channel Impedance Value: 494 Ohm
Lead Channel Impedance Value: 532 Ohm
Lead Channel Pacing Threshold Amplitude: 0.75 V
Lead Channel Pacing Threshold Amplitude: 1 V
Lead Channel Pacing Threshold Pulse Width: 0.4 ms
Lead Channel Pacing Threshold Pulse Width: 0.4 ms
Lead Channel Sensing Intrinsic Amplitude: 1.5 mV
Lead Channel Sensing Intrinsic Amplitude: 1.5 mV
Lead Channel Sensing Intrinsic Amplitude: 11.5 mV
Lead Channel Sensing Intrinsic Amplitude: 11.5 mV
Lead Channel Setting Pacing Amplitude: 2 V
Lead Channel Setting Pacing Amplitude: 2 V
Lead Channel Setting Pacing Pulse Width: 0.4 ms
Lead Channel Setting Sensing Sensitivity: 2 mV

## 2017-09-29 NOTE — Patient Instructions (Signed)
Continue using CPAP every night Now that therapy is over continue to exercise by walking every day CT of the neck only showed arthritis no problem with spinal cord Suggest she use a cane for safe ambulation Follow-up in 8 months

## 2017-10-05 ENCOUNTER — Encounter: Payer: Self-pay | Admitting: Family Medicine

## 2017-10-05 ENCOUNTER — Ambulatory Visit (INDEPENDENT_AMBULATORY_CARE_PROVIDER_SITE_OTHER): Payer: Medicare Other | Admitting: Family Medicine

## 2017-10-05 VITALS — BP 136/72 | HR 84 | Temp 98.1°F | Ht 62.0 in | Wt 176.6 lb

## 2017-10-05 DIAGNOSIS — I509 Heart failure, unspecified: Secondary | ICD-10-CM

## 2017-10-05 DIAGNOSIS — N183 Chronic kidney disease, stage 3 unspecified: Secondary | ICD-10-CM

## 2017-10-05 DIAGNOSIS — I495 Sick sinus syndrome: Secondary | ICD-10-CM | POA: Diagnosis not present

## 2017-10-05 DIAGNOSIS — E1159 Type 2 diabetes mellitus with other circulatory complications: Secondary | ICD-10-CM

## 2017-10-05 DIAGNOSIS — L821 Other seborrheic keratosis: Secondary | ICD-10-CM | POA: Diagnosis not present

## 2017-10-05 DIAGNOSIS — I152 Hypertension secondary to endocrine disorders: Secondary | ICD-10-CM

## 2017-10-05 DIAGNOSIS — K5090 Crohn's disease, unspecified, without complications: Secondary | ICD-10-CM | POA: Diagnosis not present

## 2017-10-05 DIAGNOSIS — E1122 Type 2 diabetes mellitus with diabetic chronic kidney disease: Secondary | ICD-10-CM

## 2017-10-05 DIAGNOSIS — I10 Essential (primary) hypertension: Secondary | ICD-10-CM

## 2017-10-05 DIAGNOSIS — M3500 Sicca syndrome, unspecified: Secondary | ICD-10-CM

## 2017-10-05 DIAGNOSIS — R52 Pain, unspecified: Secondary | ICD-10-CM | POA: Diagnosis not present

## 2017-10-05 DIAGNOSIS — L989 Disorder of the skin and subcutaneous tissue, unspecified: Secondary | ICD-10-CM

## 2017-10-05 DIAGNOSIS — M48062 Spinal stenosis, lumbar region with neurogenic claudication: Secondary | ICD-10-CM

## 2017-10-05 DIAGNOSIS — K219 Gastro-esophageal reflux disease without esophagitis: Secondary | ICD-10-CM | POA: Diagnosis not present

## 2017-10-05 LAB — COMPREHENSIVE METABOLIC PANEL
ALT: 14 U/L (ref 0–35)
AST: 16 U/L (ref 0–37)
Albumin: 4.1 g/dL (ref 3.5–5.2)
Alkaline Phosphatase: 109 U/L (ref 39–117)
BUN: 23 mg/dL (ref 6–23)
CO2: 30 mEq/L (ref 19–32)
Calcium: 10.1 mg/dL (ref 8.4–10.5)
Chloride: 101 mEq/L (ref 96–112)
Creatinine, Ser: 1.29 mg/dL — ABNORMAL HIGH (ref 0.40–1.20)
GFR: 42.51 mL/min — ABNORMAL LOW (ref >60.00)
Glucose, Bld: 89 mg/dL (ref 70–99)
Potassium: 4.3 mEq/L (ref 3.5–5.1)
Sodium: 137 mEq/L (ref 135–145)
Total Bilirubin: 0.5 mg/dL (ref 0.2–1.2)
Total Protein: 8.1 g/dL (ref 6.0–8.3)

## 2017-10-05 LAB — HEMOGLOBIN A1C: Hgb A1c MFr Bld: 6.2 % (ref 4.6–6.5)

## 2017-10-05 LAB — SEDIMENTATION RATE: Sed Rate: 13 mm/hr (ref 0–30)

## 2017-10-05 LAB — C-REACTIVE PROTEIN: CRP: 0.5 mg/dL (ref 0.5–20.0)

## 2017-10-05 LAB — CK: Total CK: 55 U/L (ref 7–177)

## 2017-10-05 MED ORDER — PANTOPRAZOLE SODIUM 40 MG PO TBEC: 40.0000 mg | DELAYED_RELEASE_TABLET | Freq: Every day | ORAL | 3 refills | Status: DC

## 2017-10-05 MED ORDER — HYDROCHLOROTHIAZIDE 12.5 MG PO CAPS: 12.5000 mg | ORAL_CAPSULE | Freq: Every day | ORAL | 7 refills | Status: DC

## 2017-10-05 MED ORDER — GABAPENTIN 100 MG PO CAPS: 200.0000 mg | ORAL_CAPSULE | Freq: Two times a day (BID) | ORAL | 3 refills | Status: DC

## 2017-10-05 MED ORDER — LOSARTAN POTASSIUM 50 MG PO TABS: 50.0000 mg | ORAL_TABLET | Freq: Every day | ORAL | 2 refills | Status: DC

## 2017-10-05 NOTE — Progress Notes (Signed)
Heather [name] is a 77 y.o. female is here for follow up.  History of Present Illness:   [name] acting as scribe for Dr. Juleen China.  HPI: Patient comes in today for a follow up.   CPAP: Per Neurology patient is doing well on CPAP. She is sleeping better.   Moles: Patient has moles on face and back that she is wanting to have looked at. She has had moles removed back in [place]. There is a mole on the left arm that needs to be removed. She has several skin tags that need to be removed. We will place referral to dermatology. We will take the one off on left arm.   Allergies: Nose and eyes are dry. Patient stated that she is not doing a nose spray. We will prescribe nasal spray today.    Body pain: Tramadol is making her loose her balance. Patient wants to know if we have something else to try. She does take Gabapentin before she goes to sleep. Patient states that her main pain in the fingers, shoulder, back and legs. Will try Tumeric to help with the pain. Tramadol for only when she is hurting bad.   Health Maintenance Due  Topic Date Due  . OPHTHALMOLOGY EXAM  01/01/1950  . TETANUS/TDAP  01/02/1959  . DEXA SCAN  01/01/2005  . FOOT EXAM  09/15/2016  . PNA vac Low Risk Adult (2 of 2 - PCV13) 05/27/2017   Depression screen [facility] 2/9 08/03/2017 05/27/2016 05/06/2016  Decreased Interest 0 0 0  Down, Depressed, Hopeless 0 0 0  PHQ - 2 Score 0 0 0  Altered sleeping - 1 0  Tired, decreased energy - 0 0  Change in appetite - 0 0  Feeling bad or failure about yourself  - 0 0  Trouble concentrating - 0 0  Moving slowly or fidgety/restless - 0 0  Suicidal thoughts - 0 0  PHQ-9 Score - 1 0   PMHx, SurgHx, SocialHx, FamHx, Medications, and Allergies were reviewed in the Visit Navigator and updated as appropriate.   Patient Active Problem List   Diagnosis Date Noted  . Mild cognitive impairment 09/29/2017  . Osteoarthritis of cervical spine 06/04/2017  . Imbalance 05/30/2017    . Controlled type 2 diabetes mellitus with chronic kidney disease, without long-term current use of insulin ([place]) 04/27/2017  . Scoliosis of lumbar spine 06/02/2016  . Complete tear of right rotator cuff 06/02/2016  . Neurogenic claudication due to lumbar spinal stenosis 05/06/2016  . Right shoulder pain 05/06/2016  . Sjogren's syndrome ([place]) 04/27/2016  . Xerostomia 04/27/2016  . Loss of hair 11/21/2015  . Postmenopausal atrophic vaginitis 09/16/2015  . CKD (chronic kidney disease) stage 4, GFR 15-29 ml/min (HCC) 05/06/2015  . Bilateral hip pain 05/06/2015  . Seborrheic keratoses 05/06/2015  . 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 Crohn's disease ([place]) 02/16/2014  . Prediabetes 01/29/2014  . OSA (obstructive sleep apnea) 01/29/2014  . Pulmonary hypertension ([place]) 01/29/2014  . CAD (coronary artery disease) 01/29/2014  . Hypertension 01/29/2014  . Diastolic CHF ([place]) 78/93/8101   Social History   Tobacco Use  . Smoking status: Never Smoker  . Smokeless tobacco: Never Used  Substance Use Topics  . Alcohol use: No  . Drug use: No   Current Medications and Allergies:   .  amLODipine (NORVASC) 5 MG tablet, TAKE ONE TABLET BY MOUTH DAILY, Disp: 90 tablet, Rfl: 2 .  aspirin 81 MG EC tablet, Take 1 tablet (81 mg total) by  mouth daily., Disp: 30 tablet, Rfl: 5 .  carvedilol (COREG) 6.25 MG tablet, TAKE ONE TABLET BY MOUTH TWICE DAILY WITH A MEAL, Disp: 180 tablet, Rfl: 2 .  FLUoxetine (PROZAC) 20 MG capsule, Take 1 capsule (20 mg total) by mouth every morning., Disp: 90 capsule, Rfl: 3 .  fluticasone (FLOVENT HFA) 220 MCG/ACT inhaler, Inhale 1 puff into the lungs 2 (two) times daily., Disp: 1 Inhaler, Rfl: 12 .  furosemide (LASIX) 40 MG tablet, Take one tablet (40 mg) by mouth once daily as needed for swelling/ shortness of breath. Please make appt with Dr.Skains. 1st attempt, Disp: 30 tablet, Rfl: 0 .  gabapentin (NEURONTIN) 100 MG capsule, Take 2 capsules (200 mg total) 2 (two) times  daily by mouth., Disp: 120 capsule, Rfl: 3 .  hydrochlorothiazide (MICROZIDE) 12.5 MG capsule, Take 1 capsule (12.5 mg total) by mouth daily., Disp: 30 capsule, Rfl: 7 .  losartan (COZAAR) 50 MG tablet, TAKE ONE TABLET BY MOUTH DAILY, Disp: 90 tablet, Rfl: 2 .  oxybutynin (DITROPAN-XL) 10 MG 24 hr tablet, Take 10 mg by mouth at bedtime., Disp: , Rfl:  .  pantoprazole (PROTONIX) 40 MG tablet, Take 1 tablet (40 mg total) by mouth daily., Disp: 90 tablet, Rfl: 3 .  Polyethyl Glycol-Propyl Glycol (SYSTANE) 0.4-0.3 % SOLN, Apply 2 drops to eye as needed., Disp: 10 mL, Rfl: 0 .  sodium chloride (OCEAN) 0.65 % SOLN nasal spray, Place 1 spray into both nostrils as needed (dry nose)., Disp: 30 mL, Rfl: 0 .  sucralfate (CARAFATE) 1 g tablet, Take 1 tablet four times daily - with meals and at bedtime, Disp: 120 tablet, Rfl: 1 .  traMADol (ULTRAM) 50 MG tablet, TAKE 1 TABLET BY MOUTH TWICE DAILY, Disp: 60 tablet, Rfl: 0 .  traZODone (DESYREL) 50 MG tablet, Take 1/2 to 1 tablet at bedtime as needed for sleep, Disp: 30 tablet, Rfl: 3 .  albuterol (PROVENTIL HFA;VENTOLIN HFA) 108 (90 Base) MCG/ACT inhaler, Inhale 2 puffs into the lungs every 6 (six) hours as needed for wheezing or shortness of breath., Disp: , Rfl:    Allergies  Allergen Reactions  . Amoxicillin-Pot Clavulanate Swelling and Rash   Review of Systems   Pertinent items are noted in the HPI. Otherwise, ROS is negative.  Vitals:   Vitals:   10/05/17 1056  BP: 136/72  Pulse: 84  Temp: 98.1 F (36.7 C)  TempSrc: Oral  SpO2: 97%  Weight: 176 lb 9.6 oz (80.1 kg)  Height: 5' 2"  (1.575 m)     Body mass index is 32.3 kg/m.  Physical Exam:   Physical Exam  Constitutional: She appears well-nourished.  HENT:  Head: Normocephalic and atraumatic.  Eyes: Pupils are equal, round, and reactive to light. EOM are normal.  Neck: Normal range of motion. Neck supple.  Cardiovascular: Normal rate, regular rhythm, normal heart sounds and intact  distal pulses.  Pulmonary/Chest: Effort normal.  Abdominal: Soft.  Skin: Skin is warm.  Psychiatric: She has a normal mood and affect. Her behavior is normal.  Nursing note and vitals reviewed.   Diabetic Foot Exam - Simple   Simple Foot Form Diabetic Foot exam was performed with the following findings:  Yes 10/07/2017  9:03 PM  Visual Inspection No deformities, no ulcerations, no other skin breakdown bilaterally:  Yes Sensation Testing Intact to touch and monofilament testing bilaterally:  Yes Pulse Check Posterior Tibialis and Dorsalis pulse intact bilaterally:  Yes Comments    Results for orders placed or performed in  visit on 10/05/17  Sedimentation rate  Result Value Ref Range   Sed Rate 13 0 - 30 mm/hr  C-reactive protein  Result Value Ref Range   CRP 0.5 0.5 - 20.0 mg/dL  CK  Result Value Ref Range   Total CK 55 7 - 177 U/L  Hemoglobin A1c  Result Value Ref Range   Hgb A1c MFr Bld 6.2 4.6 - 6.5 %  Comprehensive metabolic panel  Result Value Ref Range   Sodium 137 135 - 145 mEq/L   Potassium 4.3 3.5 - 5.1 mEq/L   Chloride 101 96 - 112 mEq/L   CO2 30 19 - 32 mEq/L   Glucose, Bld 89 70 - 99 mg/dL   BUN 23 6 - 23 mg/dL   Creatinine, Ser 1.29 (H) 0.40 - 1.20 mg/dL   Total Bilirubin 0.5 0.2 - 1.2 mg/dL   Alkaline Phosphatase 109 39 - 117 U/L   AST 16 0 - 37 U/L   ALT 14 0 - 35 U/L   Total Protein 8.1 6.0 - 8.3 g/dL   Albumin 4.1 3.5 - 5.2 g/dL   Calcium 10.1 8.4 - 10.5 mg/dL   GFR 42.51 (L) >60.00 mL/min   Assessment and Plan:   1. Neurogenic claudication due to lumbar spinal stenosis - gabapentin (NEURONTIN) 100 MG capsule; Take 2 capsules (200 mg total) by mouth 2 (two) times daily.  Dispense: 120 capsule; Refill: 3  2. Complaints of total body pain - Sedimentation rate - C-reactive protein - CK  3. Controlled type 2 diabetes mellitus with chronic kidney disease, without long-term current use of insulin, unspecified CKD stage (HCC) - Hemoglobin A1c  4.  Skin lesion of left arm Punch biopsy Indication: suspicious lesion Location: left upper arm Size: 3 mm Verbal informed consent obtained.  Pt aware of risks not limited to but including infection, bleeding, damage to near by organs. Prep: etoh/betadine Anesthesia: 1% lidocaine with epi, good effect Punch made with 4 mm punch Minimal oozing, controlled with drysol Tolerated well Routine postprocedure instructions d/w pt- keep area clean and bandaged, follow up if concerns/spreading erythema/pain.  - Dermatology pathology  5. Facial lesion - Ambulatory referral to Dermatology  6. Hypertension associated with diabetes ([place]) - losartan (COZAAR) 50 MG tablet; Take 1 tablet (50 mg total) by mouth daily.  Dispense: 90 tablet; Refill: 2 - hydrochlorothiazide (MICROZIDE) 12.5 MG capsule; Take 1 capsule (12.5 mg total) by mouth daily.  Dispense: 30 capsule; Refill: 7 - Comprehensive metabolic panel  7. CKD stage 3 due to type 2 diabetes mellitus ([place])  8. Sjogren's syndrome, with unspecified organ involvement ([place])  9. Congestive heart failure, unspecified HF chronicity, unspecified heart failure type ([place])  10. Crohn's disease without complication, unspecified gastrointestinal tract location ([place])  11. Sinus node dysfunction (HCC)  12. Gastroesophageal reflux disease without esophagitis - pantoprazole (PROTONIX) 40 MG tablet; Take 1 tablet (40 mg total) by mouth daily.  Dispense: 90 tablet; Refill: 3   . Reviewed expectations re: course of current medical issues. . Discussed self-management of symptoms. . Outlined signs and symptoms indicating need for more acute intervention. . Patient verbalized understanding and all questions were answered. Marland Kitchen Health Maintenance issues including appropriate healthy diet, exercise, and smoking avoidance were discussed with patient. . See orders for this visit as documented in the electronic medical record. . Patient received an After Visit  Summary.  CMA served as Education administrator during this visit. History, Physical, and Plan performed by medical provider. The above documentation has been  reviewed and is accurate and complete. Briscoe Deutscher, D.O.  Briscoe Deutscher, DO Kevin, [facility] 10/07/2017

## 2017-10-07 ENCOUNTER — Other Ambulatory Visit: Payer: Self-pay

## 2017-10-07 DIAGNOSIS — E1122 Type 2 diabetes mellitus with diabetic chronic kidney disease: Secondary | ICD-10-CM | POA: Insufficient documentation

## 2017-10-07 DIAGNOSIS — N183 Chronic kidney disease, stage 3 (moderate): Secondary | ICD-10-CM

## 2017-10-07 DIAGNOSIS — G4700 Insomnia, unspecified: Secondary | ICD-10-CM

## 2017-10-07 DIAGNOSIS — I495 Sick sinus syndrome: Secondary | ICD-10-CM | POA: Insufficient documentation

## 2017-10-07 MED ORDER — TRAZODONE HCL 50 MG PO TABS: ORAL_TABLET | ORAL | 3 refills | Status: DC

## 2017-10-07 NOTE — Telephone Encounter (Signed)
Patient came by for refill on oxybutynin do not see where you have given in the past ok to refill. She also said that you were going to send in script for nose spray but it was not sent. Will be happy to do just let me know what.

## 2017-10-07 NOTE — Telephone Encounter (Signed)
Okay for both. Nose spray is flonase.

## 2017-10-08 ENCOUNTER — Telehealth: Payer: Self-pay | Admitting: Family Medicine

## 2017-10-08 MED ORDER — OXYBUTYNIN CHLORIDE ER 10 MG PO TB24: 10.0000 mg | ORAL_TABLET | Freq: Every day | ORAL | 3 refills | Status: DC

## 2017-10-08 MED ORDER — FLUTICASONE PROPIONATE 50 MCG/ACT NA SUSP: 2.0000 | Freq: Every day | NASAL | 6 refills | Status: DC

## 2017-10-08 NOTE — Telephone Encounter (Signed)
Lab  And pathology  Results  Given to patients  Daughter

## 2017-10-08 NOTE — Telephone Encounter (Signed)
Copied from [place]. Topic: Quick Communication - Lab Results >> Oct 08, 2017  1:54 PM Francella Solian, CMA wrote:  Called patient to inform them of 10/08/17 lab results. When patient returns call, triage nurse may disclose results.

## 2017-10-18 ENCOUNTER — Telehealth: Payer: Self-pay | Admitting: Family Medicine

## 2017-10-18 ENCOUNTER — Encounter: Payer: Self-pay | Admitting: Nurse Practitioner

## 2017-10-18 NOTE — Telephone Encounter (Signed)
Called office we were called by mistake did not need not from our office.

## 2017-10-18 NOTE — Telephone Encounter (Signed)
Copied from [place] [id number]. Topic: Quick Communication - See Telephone Encounter >> Oct 18, 2017  2:57 PM Conception Chancy, NT wrote: CRM for notification. See Telephone encounter for: 10/18/17.  Amy is calling from Dr. Katy Fitch office and states she is needing the last office notes faxed to her.   Fax# [phone] Cb# [phone]

## 2017-10-19 DIAGNOSIS — H43813 Vitreous degeneration, bilateral: Secondary | ICD-10-CM | POA: Diagnosis not present

## 2017-10-19 DIAGNOSIS — H25813 Combined forms of age-related cataract, bilateral: Secondary | ICD-10-CM | POA: Diagnosis not present

## 2017-10-27 DIAGNOSIS — L821 Other seborrheic keratosis: Secondary | ICD-10-CM | POA: Diagnosis not present

## 2017-10-27 DIAGNOSIS — L738 Other specified follicular disorders: Secondary | ICD-10-CM | POA: Diagnosis not present

## 2017-10-27 DIAGNOSIS — D485 Neoplasm of uncertain behavior of skin: Secondary | ICD-10-CM | POA: Diagnosis not present

## 2017-10-27 DIAGNOSIS — L219 Seborrheic dermatitis, unspecified: Secondary | ICD-10-CM | POA: Diagnosis not present

## 2017-11-02 NOTE — Progress Notes (Signed)
Personally  participated in, made any corrections needed, and agree with history, physical, neuro exam,assessment and plan as stated above.    Antonia Ahern, MD [facility] 

## 2017-11-04 DIAGNOSIS — H2512 Age-related nuclear cataract, left eye: Secondary | ICD-10-CM | POA: Diagnosis not present

## 2017-11-04 DIAGNOSIS — H268 Other specified cataract: Secondary | ICD-10-CM | POA: Diagnosis not present

## 2017-11-10 ENCOUNTER — Encounter: Payer: Self-pay | Admitting: Internal Medicine

## 2017-11-10 ENCOUNTER — Ambulatory Visit (INDEPENDENT_AMBULATORY_CARE_PROVIDER_SITE_OTHER): Payer: Medicare Other | Admitting: Internal Medicine

## 2017-11-10 VITALS — BP 130/70 | HR 77 | Ht 62.0 in | Wt 178.0 lb

## 2017-11-10 DIAGNOSIS — Z950 Presence of cardiac pacemaker: Secondary | ICD-10-CM | POA: Diagnosis not present

## 2017-11-10 DIAGNOSIS — I495 Sick sinus syndrome: Secondary | ICD-10-CM

## 2017-11-10 DIAGNOSIS — I5032 Chronic diastolic (congestive) heart failure: Secondary | ICD-10-CM | POA: Diagnosis not present

## 2017-11-10 DIAGNOSIS — N184 Chronic kidney disease, stage 4 (severe): Secondary | ICD-10-CM

## 2017-11-10 LAB — CUP PACEART INCLINIC DEVICE CHECK
Battery Remaining Longevity: 77 mo
Battery Voltage: 3.01 V
Brady Statistic AP VP Percent: 0.03 %
Brady Statistic AP VS Percent: 93.07 %
Brady Statistic AS VP Percent: 0 %
Brady Statistic AS VS Percent: 6.9 %
Brady Statistic RA Percent Paced: 92.87 %
Brady Statistic RV Percent Paced: 0.04 %
Date Time Interrogation Session: 20190424125525
Implantable Lead Implant Date: 20150729
Implantable Lead Implant Date: 20150729
Implantable Lead Location: 753859
Implantable Lead Location: 753860
Implantable Lead Model: 5076
Implantable Lead Model: 5076
Implantable Pulse Generator Implant Date: 20150729
Lead Channel Impedance Value: 380 Ohm
Lead Channel Impedance Value: 494 Ohm
Lead Channel Impedance Value: 494 Ohm
Lead Channel Impedance Value: 532 Ohm
Lead Channel Pacing Threshold Amplitude: 0.875 V
Lead Channel Pacing Threshold Amplitude: 1 V
Lead Channel Pacing Threshold Pulse Width: 0.4 ms
Lead Channel Pacing Threshold Pulse Width: 0.4 ms
Lead Channel Sensing Intrinsic Amplitude: 1.375 mV
Lead Channel Sensing Intrinsic Amplitude: 1.75 mV
Lead Channel Sensing Intrinsic Amplitude: 11 mV
Lead Channel Sensing Intrinsic Amplitude: 12.5 mV
Lead Channel Setting Pacing Amplitude: 2 V
Lead Channel Setting Pacing Amplitude: 2 V
Lead Channel Setting Pacing Pulse Width: 0.4 ms
Lead Channel Setting Sensing Sensitivity: 2 mV

## 2017-11-10 NOTE — Progress Notes (Signed)
Patient Care Team: Briscoe Deutscher, DO as PCP - General (Family Medicine)   HPI  Heather [name] is a 77 y.o. female Seen in follow-up for sinus node dysfunction for which she underwent pacemaker implantation 7/15  The patient denies chest pain, shortness of breath, nocturnal dyspnea, orthopnea; no palpitations or syncope.  She has had some left leg edema as well as some abdominal distention.      DATE TEST    7/15 Echo EF 60    9/17 Echo EF 60    LAE  (42/2.2/28)    Date Cr K  9/17 1.67 4.0  3/19 1.29 4.3        Past Medical History:  Diagnosis Date  . CHF (congestive heart failure) ([place])   . Chronic back pain   . Crohn's disease ([place])   . GERD (gastroesophageal reflux disease)   . High cholesterol   . History of diabetes mellitus, type II   . History of echocardiogram    Echo 12/16: EF 60-65%, no RWMA, Gr 2 DD, mild AS (mean 13 mmHg), mild AI, MAC, trivial MS, mild MR, mild LAE, mild RVE, mild to mod TR, PASP 36 mmHg  . Hypertension   . Migraine    "1-2 times/yr" (02/16/2014)  . Murmur, cardiac   . OSA on CPAP   . 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 Pulmonary hypertension ([place])   . Sinus bradycardia 01/29/2014  . Skin cancer 2003   "3 cut off face" (02/16/2014)    Past Surgical History:  Procedure Laterality Date  . CARDIAC CATHETERIZATION  1995; 2005  . CATARACT EXTRACTION Bilateral 1982  . ESOPHAGOGASTRODUODENOSCOPY (EGD) WITH PROPOFOL N/A 12/28/2016   Procedure: ESOPHAGOGASTRODUODENOSCOPY (EGD) WITH PROPOFOL;  Surgeon: Otis Brace, MD;  Location: [facility];  Service: Gastroenterology;  Laterality: N/A;  . INSERT / REPLACE / REMOVE PACEMAKER  02/14/2014  . PERMANENT PACEMAKER INSERTION N/A 02/14/2014   Procedure: PERMANENT PACEMAKER INSERTION;  Surgeon: Deboraha Sprang, MD;  [facility] C[facility] LAB;  Service: Cardiovascular;  Laterality: N/A;  . SKIN CANCER EXCISION  2003    "face X 3" (02/16/2014)  . TEMPORARY PACEMAKER INSERTION N/A 02/12/2014   Procedure: TEMPORARY PACEMAKER INSERTION;  Surgeon: Lorretta Harp, MD;  Location: [facility] CATH LAB;  Service: Cardiovascular;  Laterality: N/A;  . VAGINAL HYSTERECTOMY  1972    Current Outpatient Medications  Medication Sig Dispense Refill  . albuterol (PROVENTIL HFA;VENTOLIN HFA) 108 (90 Base) MCG/ACT inhaler Inhale 2 puffs into the lungs every 6 (six) hours as needed for wheezing or shortness of breath.    Marland Kitchen amLODipine (NORVASC) 5 MG tablet TAKE ONE TABLET BY MOUTH DAILY 90 tablet 2  . aspirin 81 MG EC tablet Take 1 tablet (81 mg total) by mouth daily. 30 tablet 5  . carvedilol (COREG) 6.25 MG tablet TAKE ONE TABLET BY MOUTH TWICE DAILY WITH A MEAL 180 tablet 2  . FLUoxetine (PROZAC) 20 MG capsule Take 1 capsule (20 mg total) by mouth every morning. 90 capsule 3  . fluticasone (FLONASE) 50 MCG/ACT nasal spray Place 2 sprays into both nostrils daily. 16 g 6  . fluticasone (FLOVENT HFA) 220 MCG/ACT inhaler Inhale 1 puff into the lungs 2 (two) times daily. 1 Inhaler 12  . furosemide (LASIX) 40 MG tablet Take one tablet (40 mg) by mouth once daily as needed for swelling/ shortness of breath. Please make appt with Dr.Skains. 1st attempt 30 tablet 0  . gabapentin (NEURONTIN) 100 MG capsule Take 2 capsules (  200 mg total) by mouth 2 (two) times daily. 120 capsule 3  . hydrochlorothiazide (MICROZIDE) 12.5 MG capsule Take 1 capsule (12.5 mg total) by mouth daily. 30 capsule 7  . losartan (COZAAR) 50 MG tablet Take 1 tablet (50 mg total) by mouth daily. 90 tablet 2  . oseltamivir (TAMIFLU) 75 MG capsule Take 1 capsule (75 mg total) by mouth 2 (two) times daily. 10 capsule 0  . pantoprazole (PROTONIX) 40 MG tablet Take 1 tablet (40 mg total) by mouth daily. 90 tablet 3  . Polyethyl Glycol-Propyl Glycol (SYSTANE) 0.4-0.3 % SOLN Apply 2 drops to eye as needed. 10 mL 0  . sodium chloride (OCEAN) 0.65 % SOLN nasal spray Place 1 spray into both nostrils as needed (dry nose). 30 mL 0  . sucralfate (CARAFATE) 1 g  tablet Take 1 tablet four times daily - with meals and at bedtime 120 tablet 1  . traZODone (DESYREL) 50 MG tablet Take 1/2 to 1 tablet at bedtime as needed for sleep 30 tablet 3   No current facility-administered medications for this visit.     Allergies  Allergen Reactions  . Amoxicillin-Pot Clavulanate Swelling and Rash    Review of Systems negative except from HPI and PMH  Physical Exam BP 130/70   Pulse 77   Ht _0  (1.575 m)   Wt 178 lb (80.7 kg)   LMP  (LMP Unknown)   SpO2 98%   BMI 32.56 kg/m  Well developed and nourished in no acute distress HENT normal Neck supple with JVP-8-9 Regular rate and rhythm, no murmurs or gallops Abd-soft with active BS Right upper quadrant tenderness No Clubbing cyanosis trace left side edema skin-warm and dry A & Oriented  Grossly normal sensory and motor function  ECG atrial pacing at 77 20/09/38   Assessment and  Plan  Sinus node dysfunction  Hypertension  HFpEF  Chronic Kidney Disease  Pacemaker implantation- Medtronic The patient's device was interrogated.  The information was reviewed. No changes were made in the programming.    Mild volume overload.  Have encouraged her to take her Lasix 3 days in a row.  Heart rate excursion is reasonable.  Most recent renal function was improved.

## 2017-11-10 NOTE — Patient Instructions (Signed)
Medication Instructions:  Your physician recommends that you continue on your current medications as directed. Please refer to the Current Medication list given to you today.  Labwork: None ordered.  Testing/Procedures: None ordered.  Follow-Up: Your physician wants you to follow-up in: One Year with Dr Caryl Comes. You will receive a reminder letter in the mail two months in advance. If you don't receive a letter, please call our office to schedule the follow-up appointment.  Remote monitoring is used to monitor your Pacemaker from home. This monitoring reduces the number of office visits required to check your device to one time per year. It allows [place] to keep an eye on the functioning of your device to ensure it is working properly. You are scheduled for a device check from home on 12/15/2017. You may send your transmission at any time that day. If you have a wireless device, the transmission will be sent automatically. After your physician reviews your transmission, you will receive a postcard with your next transmission date.   Any Other Special Instructions Will Be Listed Below (If Applicable).     If you need a refill on your cardiac medications before your next appointment, please call your pharmacy.

## 2017-11-11 ENCOUNTER — Ambulatory Visit: Payer: Medicare Other | Admitting: *Deleted

## 2017-11-11 NOTE — Progress Notes (Deleted)
Subjective:   Heather [name] is a 77 y.o. female who presents for Medicare Annual (Subsequent) preventive examination.  Reports health as  Last seen 07/2017 apt for fup June 19/2019  Diet Chol/hdl 4; hdl 52; trig 88 DM 6.2 A1c in march/BS 89   Exercise   Health Maintenance Due  Topic Date Due  . OPHTHALMOLOGY EXAM  01/01/1950  . TETANUS/TDAP  01/02/1959  . DEXA SCAN  01/01/2005  . PNA vac Low Risk Adult (2 of 2 - PCV13) 05/27/2017   Due prevnar 13    Colonoscopy do not see result but not in HM Mammogram ?  Dexa - due      Objective:     Vitals: LMP  (LMP Unknown)   There is no height or weight on file to calculate BMI.  Advanced Directives 06/25/2017 11/25/2016 07/06/2016 05/27/2016 05/06/2016 04/22/2016 11/21/2015  Does Patient Have a Medical Advance Directive? _0  No No  Would patient like information on creating a medical advance directive? No - Patient declined - - No - patient declined information No - patient declined information No - patient declined information -  Pre-existing out of facility DNR order (yellow form or pink MOST form) - - - - - - -    Tobacco Social History   Tobacco Use  Smoking Status Never Smoker  Smokeless Tobacco Never Used     Counseling given: Not Answered   Clinical Intake:    Past Medical History:  Diagnosis Date  . CHF (congestive heart failure) ([place])   . Chronic back pain   . Crohn's disease ([place])   . GERD (gastroesophageal reflux disease)   . High cholesterol   . History of diabetes mellitus, type II   . History of echocardiogram    Echo 12/16: EF 60-65%, no RWMA, Gr 2 DD, mild AS (mean 13 mmHg), mild AI, MAC, trivial MS, mild MR, mild LAE, mild RVE, mild to mod TR, PASP 36 mmHg  . Hypertension   . Migraine    "1-2 times/yr" (02/16/2014)  . Murmur, cardiac   . OSA on CPAP   . 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 Pulmonary hypertension ([place])   . Sinus bradycardia 01/29/2014  . Skin cancer 2003   "3 cut off  face" (02/16/2014)   Past Surgical History:  Procedure Laterality Date  . CARDIAC CATHETERIZATION  1995; 2005  . CATARACT EXTRACTION Bilateral 1982  . ESOPHAGOGASTRODUODENOSCOPY (EGD) WITH PROPOFOL N/A 12/28/2016   Procedure: ESOPHAGOGASTRODUODENOSCOPY (EGD) WITH PROPOFOL;  Surgeon: Otis Brace, MD;  Location: [facility];  Service: Gastroenterology;  Laterality: N/A;  . INSERT / REPLACE / REMOVE PACEMAKER  02/14/2014  . PERMANENT PACEMAKER INSERTION N/A 02/14/2014   Procedure: PERMANENT PACEMAKER INSERTION;  Surgeon: Deboraha Sprang, MD;  Location[facility] C[facility] LAB;  Service: Cardiovascular;  Laterality: N/A;  . SKIN CANCER EXCISION  2003    "face X 3" (02/16/2014)  . TEMPORARY PACEMAKER INSERTION N/A 02/12/2014   Procedure: TEMPORARY PACEMAKER INSERTION;  Surgeon: Lorretta Harp, MD;  Location: [facility] CATH LAB;  Service: Cardiovascular;  Laterality: N/A;  . VAGINAL HYSTERECTOMY  1972   Family History  Adopted: Yes  Problem Relation Age of Onset  . Hypertension Mother   . Heart disease Mother    Social History   Socioeconomic History  . Marital status: Widowed    Spouse name: Not on file  . Number of children: 5  . Years of education: middle school  . Highest education level: Not on file  Occupational History  . Not on file  Social Needs  . Financial resource strain: Not on file  . Food insecurity:    Worry: Not on file    Inability: Not on file  . Transportation needs:    Medical: Not on file    Non-medical: Not on file  Tobacco Use  . Smoking status: Never Smoker  . Smokeless tobacco: Never Used  Substance and Sexual Activity  . Alcohol use: No  . Drug use: No  . Sexual activity: Never  Lifestyle  . Physical activity:    Days per week: Not on file    Minutes per session: Not on file  . Stress: Not on file  Relationships  . Social connections:    Talks on phone: Not on file    Gets together: Not on file    Attends religious service: Not on file    Active member of  club or organization: Not on file    Attends meetings of clubs or organizations: Not on file    Relationship status: Not on file  Other Topics Concern  . Not on file  Social History Narrative   Moved here from [place] in October 2013.   Right handed   1 cup of caffeine daily    Outpatient Encounter Medications as of 11/11/2017  Medication Sig  . albuterol (PROVENTIL HFA;VENTOLIN HFA) 108 (90 Base) MCG/ACT inhaler Inhale 2 puffs into the lungs every 6 (six) hours as needed for wheezing or shortness of breath.  Marland Kitchen amLODipine (NORVASC) 5 MG tablet TAKE ONE TABLET BY MOUTH DAILY  . aspirin 81 MG EC tablet Take 1 tablet (81 mg total) by mouth daily.  . carvedilol (COREG) 6.25 MG tablet TAKE ONE TABLET BY MOUTH TWICE DAILY WITH A MEAL  . FLUoxetine (PROZAC) 20 MG capsule Take 1 capsule (20 mg total) by mouth every morning.  . fluticasone (FLONASE) 50 MCG/ACT nasal spray Place 2 sprays into both nostrils daily.  . fluticasone (FLOVENT HFA) 220 MCG/ACT inhaler Inhale 1 puff into the lungs 2 (two) times daily.  . furosemide (LASIX) 40 MG tablet Take one tablet (40 mg) by mouth once daily as needed for swelling/ shortness of breath. Please make appt with Dr.Skains. 1st attempt  . gabapentin (NEURONTIN) 100 MG capsule Take 2 capsules (200 mg total) by mouth 2 (two) times daily.  . hydrochlorothiazide (MICROZIDE) 12.5 MG capsule Take 1 capsule (12.5 mg total) by mouth daily.  Marland Kitchen losartan (COZAAR) 50 MG tablet Take 1 tablet (50 mg total) by mouth daily.  Marland Kitchen oseltamivir (TAMIFLU) 75 MG capsule Take 1 capsule (75 mg total) by mouth 2 (two) times daily.  . pantoprazole (PROTONIX) 40 MG tablet Take 1 tablet (40 mg total) by mouth daily.  Vladimir Faster Glycol-Propyl Glycol (SYSTANE) 0.4-0.3 % SOLN Apply 2 drops to eye as needed.  . sodium chloride (OCEAN) 0.65 % SOLN nasal spray Place 1 spray into both nostrils as needed (dry nose).  . sucralfate (CARAFATE) 1 g tablet Take 1 tablet four times daily - with meals  and at bedtime  . traZODone (DESYREL) 50 MG tablet Take 1/2 to 1 tablet at bedtime as needed for sleep   No facility-administered encounter medications on file as of 11/11/2017.     Activities of Daily Living No flowsheet data found.  Patient Care Team: Briscoe Deutscher, DO as PCP - General (Family Medicine)    Assessment:   This is a routine wellness examination for [place].  Exercise Activities and Dietary recommendations  Goals    None      Fall Risk Fall Risk  09/29/2017 08/03/2017 07/06/2016 04/22/2016 05/06/2015  Falls in the past year? Yes No No No No  Number falls in past yr: 1 - - - -  Injury with Fall? Yes - - - -  Comment hurt knee, 1 finger - - - -  Risk for fall due to : Other (Comment);Impaired balance/gait - - - -  Risk for fall due to: Comment unsure of reason for fall - - - -     Depression Screen PHQ 2/9 Scores 08/03/2017 05/27/2016 05/06/2016 04/22/2016  PHQ - 2 Score 0 0 0 0  PHQ- 9 Score - 1 0 -     Cognitive Function MMSE - Mini Mental State Exam 09/29/2017 05/28/2017  Orientation to time 4 5  Orientation to Place 3 4  Registration 3 3  Attention/ Calculation 2 5  Recall 2 1  Language- name 2 objects 2 2  Language- repeat 0 1  Language- repeat-comments unable to do, Speaks spanish only -  Language- follow 3 step command 3 3  Language- read & follow direction 1 1  Write a sentence 1 1  Copy design 1 0  Total score 22 26        Immunization History  Administered Date(s) Administered  . Influenza, High Dose Seasonal PF 04/26/2017  . Influenza,inj,Quad PF,6+ Mos 03/14/2014, 05/06/2015, 05/06/2016  . Pneumococcal Polysaccharide-23 05/27/2016      Screening Tests Health Maintenance  Topic Date Due  . OPHTHALMOLOGY EXAM  01/01/1950  . TETANUS/TDAP  01/02/1959  . DEXA SCAN  01/01/2005  . PNA vac Low Risk Adult (2 of 2 - PCV13) 05/27/2017  . INFLUENZA VACCINE  02/17/2018  . HEMOGLOBIN A1C  04/07/2018  . FOOT EXAM  10/06/2018           Plan:      PCP Notes ***  Health Maintenance ***  Abnormal Screens  ***  Referrals  ***  Patient concerns; ***  Nurse Concerns; ***  Next PCP apt ***      I have personally reviewed and noted the following in the patient's chart:   . Medical and social history . Use of alcohol, tobacco or illicit drugs  . Current medications and supplements . Functional ability and status . Nutritional status . Physical activity . Advanced directives . List of other physicians . Hospitalizations, surgeries, and ER visits in previous 12 months . Vitals . Screenings to include cognitive, depression, and falls . Referrals and appointments  In addition, I have reviewed and discussed with patient certain preventive protocols, quality metrics, and best practice recommendations. A written personalized care plan for preventive services as well as general preventive health recommendations were provided to patient.     Wynetta Fines, RN  11/11/2017

## 2017-11-12 DIAGNOSIS — D485 Neoplasm of uncertain behavior of skin: Secondary | ICD-10-CM | POA: Diagnosis not present

## 2017-11-15 ENCOUNTER — Encounter: Payer: Self-pay | Admitting: Sports Medicine

## 2017-11-15 ENCOUNTER — Ambulatory Visit (INDEPENDENT_AMBULATORY_CARE_PROVIDER_SITE_OTHER): Payer: Medicare Other | Admitting: Sports Medicine

## 2017-11-15 VITALS — BP 102/64 | HR 84 | Ht 62.0 in | Wt 178.0 lb

## 2017-11-15 DIAGNOSIS — M479 Spondylosis, unspecified: Secondary | ICD-10-CM

## 2017-11-15 DIAGNOSIS — M48062 Spinal stenosis, lumbar region with neurogenic claudication: Secondary | ICD-10-CM | POA: Diagnosis not present

## 2017-11-15 DIAGNOSIS — K219 Gastro-esophageal reflux disease without esophagitis: Secondary | ICD-10-CM

## 2017-11-15 DIAGNOSIS — R52 Pain, unspecified: Secondary | ICD-10-CM

## 2017-11-15 DIAGNOSIS — I509 Heart failure, unspecified: Secondary | ICD-10-CM

## 2017-11-15 MED ORDER — GABAPENTIN 100 MG PO CAPS: 200.0000 mg | ORAL_CAPSULE | Freq: Two times a day (BID) | ORAL | 3 refills | Status: DC

## 2017-11-15 NOTE — Progress Notes (Signed)
[name] [name], [place] at [place] - 77 y.o. female MRN [id number]  Date of birth: [birth year]  Visit Date: 11/15/2017  PCP: Briscoe Deutscher, DO   Referred by: Briscoe Deutscher, DO  Scribe for today's visit: Josepha Pigg, CMA     SUBJECTIVE:  Heather [name] is here for Follow-up (Back pain) .   06/04/17 Heather [name] is an established pt and presenting today for f/u of her B hip and R shoulder pain.  She was last seen on 04/09/17.  She has taken and completed a prednisone taper and is currently taking Gabapentin.  PT was suggested at her last visit but she did not schedule any appointments.  She had a lumbar epidural on 04/29/17.  Pt states that she has been feeling very good but wants to know if she can have another one perhaps in the middle of her lumbar spine.  Pt states that her R shoulder con't to hurt and bother her and does not notice any change in those symptoms.  Pt will be starting home PT next week mainly for balance issues.  11/15/16: Compared to the last office visit, her previously described back pain symptoms are improving. 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  Current symptoms are mild & are radiating to R hip and into the right leg. She also c/o pain in the L hip but this does NOT radiate into the leg. She is also experiencing pain in the groin region.  She has been taking Gabapentin BID.  She had a lumbar epidural on 04/29/17 and a C7-T1 epidural on 07/05/17.  She had a CT of her c-spine on 06/09/17.   ROS Denies night time disturbances. Denies fevers, chills, or night sweats. Denies unexplained weight loss. Denies changes in bowel or bladder habits. Denies recent unreported falls. Denies new or worsening dyspnea or wheezing. Denies headaches or dizziness.  Reports numbness, tingling or weakness  In the extremities.  Denies dizziness or presyncopal episodes Reports lower extremity edema -  recently had fluid pill increased by cardiology.    HISTORY & PERTINENT PRIOR DATA:  Prior History reviewed and updated per electronic medical record.  Significant/pertinent history, findings, studies include:  reports that she has never smoked. She has never used smokeless tobacco. Recent Labs    10/05/17 1129  HGBA1C 6.2   The 10-year ASCVD risk score Mikey Bussing DC Jr., et al., 2013) is: 58.3%   Values used to calculate the score:     Age: 89 years     Sex: Female     Is Non-Hispanic African American: No     Diabetic: Yes     Tobacco smoker: No     Systolic Blood Pressure: 654 mmHg     Is BP treated: Yes     HDL Cholesterol: 52.1 mg/dL     Total Cholesterol: 191 mg/dL No problems updated.  OBJECTIVE:  VS:  HT:5' 2"  (157.5 cm)   WT:178 lb (80.7 kg)  BMI:32.55    BP:102/64  HR:84bpm  TEMP: ( )  RESP:96 %   PHYSICAL EXAM: Constitutional: WDWN, Non-toxic appearing. Psychiatric: Alert & appropriately interactive.  Not depressed or anxious appearing. Respiratory: No increased work of breathing.  Trachea Midline Eyes: Pupils are equal.  EOM intact without nystagmus.  No scleral icterus  Vascular Exam: DP and PT pulses are palpable but barely. 1+ pitting edema bilaterally and pretibial region.  lower extremity neuro exam: unremarkable normal  strength normal sensation normal reflexes  MSK Exam: Pain with straight leg raise bilaterally with markedly tight hamstrings.  No focal weakness in dermatomal distribution but generalized difficulty with sitting and standing on her own.   ASSESSMENT & PLAN:   1. Spondylosis   2. Neurogenic claudication due to lumbar spinal stenosis   3. Complaints of total body pain   4. Congestive heart failure, unspecified HF chronicity, unspecified heart failure type ([place])   5. Gastroesophageal reflux disease without esophagitis     PLAN: She is overall done fairly well and we will go ahead and re-fill her gabapentin.  We will plan to  follow-up with her in about 6 months to check on her progress and ensure that she is doing well.   We will plan to refer back to Dr. Ernestina Patches given that she did quite well after her last epidural steroid injection.  Also happy to defer to him for repeat injections at her discretion.  I would like to see her back every 6 months if I am continue to refill her gabapentin but otherwise am happy to defer to her PCP and/or Dr. Ernestina Patches for her ongoing care and less I can otherwise be of assistance.  Follow-up: Return in about 6 months (around 05/17/2018).      Please see additional documentation for Objective, Assessment and Plan sections. Pertinent additional documentation may be included in corresponding procedure notes, imaging studies, problem based documentation and patient instructions. Please see these sections of the encounter for additional information regarding this visit.  CMA/ATC served as Education administrator during this visit. History, Physical, and Plan performed by medical provider. Documentation and orders reviewed and attested to.      Gerda Diss, [facility] Physician

## 2017-11-21 DIAGNOSIS — H2511 Age-related nuclear cataract, right eye: Secondary | ICD-10-CM | POA: Diagnosis not present

## 2017-11-22 ENCOUNTER — Ambulatory Visit (INDEPENDENT_AMBULATORY_CARE_PROVIDER_SITE_OTHER): Payer: Medicare Other | Admitting: Family Medicine

## 2017-11-22 ENCOUNTER — Telehealth: Payer: Self-pay | Admitting: Family Medicine

## 2017-11-22 ENCOUNTER — Ambulatory Visit (INDEPENDENT_AMBULATORY_CARE_PROVIDER_SITE_OTHER): Payer: Medicare Other

## 2017-11-22 ENCOUNTER — Ambulatory Visit: Payer: Medicare Other | Admitting: Physician Assistant

## 2017-11-22 ENCOUNTER — Encounter: Payer: Self-pay | Admitting: Family Medicine

## 2017-11-22 VITALS — BP 112/62 | HR 86 | Temp 98.2°F | Ht 62.0 in | Wt 179.6 lb

## 2017-11-22 DIAGNOSIS — R1030 Lower abdominal pain, unspecified: Secondary | ICD-10-CM

## 2017-11-22 DIAGNOSIS — N10 Acute tubulo-interstitial nephritis: Secondary | ICD-10-CM | POA: Diagnosis not present

## 2017-11-22 LAB — POCT URINALYSIS DIPSTICK
Blood, UA: NEGATIVE
Glucose, UA: NEGATIVE
Ketones, UA: NEGATIVE
Nitrite, UA: NEGATIVE
Protein, UA: NEGATIVE
Spec Grav, UA: 1.025 (ref 1.010–1.025)
Urobilinogen, UA: 1 E.U./dL
pH, UA: 6 (ref 5.0–8.0)

## 2017-11-22 MED ORDER — NITROFURANTOIN MONOHYD MACRO 100 MG PO CAPS: 100.0000 mg | ORAL_CAPSULE | Freq: Two times a day (BID) | ORAL | 0 refills | Status: DC

## 2017-11-22 MED ORDER — PHENAZOPYRIDINE HCL 100 MG PO TABS: 100.0000 mg | ORAL_TABLET | Freq: Three times a day (TID) | ORAL | 0 refills | Status: DC | PRN

## 2017-11-22 MED ORDER — CEFTRIAXONE SODIUM 1 G IJ SOLR
1.0000 g | Freq: Once | INTRAMUSCULAR | Status: AC
  Administered 2017-11-22: 1 g via INTRAMUSCULAR

## 2017-11-22 NOTE — Patient Instructions (Signed)
Pielonefritis en los adultos (Pyelonephritis, Adult) La pielonefritis es una infeccin del rin. Los riones son los rganos que ayudan a limpiar la sangre al [employer] residuos a travs de la [place]. Esta infeccin puede curarse rpidamente o durar [employer]. En la [employer], desaparece con el tratamiento y no causa otros problemas. Talty los medicamentos de venta libre y los recetados solamente como se lo haya indicado el [place] antibitico como se lo indic su mdico. No deje de tomar los medicamentos aunque comience a Sports administrator. Instrucciones generales  Beba suficiente lquido para mantener el pis claro o de color amarillo plido.  Evite la cafena, el t y las bebidas gaseosas.  Orine con frecuencia. Evite retener la orina durante largos perodos.  Orine antes y despus [place].  Despus de defecar, las mujeres deben higienizarse desde adelante hacia atrs. Use cada trozo de papel higinico solo una vez.  Concurra a todas las visitas de control como se lo haya indicado el mdico. Esto es importante. SOLICITE AYUDA SI:  No mejora luego de 2 das.  Los sntomas empeoran.  Tiene fiebre. SOLICITE AYUDA DE INMEDIATO SI:  No puede tomar los medicamentos o beber lquidos segn las indicaciones.  Siente [place] a Emergency planning/management officer.  Vomita.  Tiene un dolor muy intenso en el costado (fosa lumbar) o en la espalda.  Se siente muy dbil o se desvanece (se desmaya). Esta informacin no tiene Marine scientist el consejo del mdico. Asegrese de hacerle al mdico cualquier pregunta que tenga. Document Released: 07/06/2005 Document Revised: 03/27/2015 Document Reviewed: 10/29/2014 Elsevier Interactive Patient Education  [employer].

## 2017-11-22 NOTE — Telephone Encounter (Signed)
Copied from [place] [id number]. Topic: Quick Communication - See Telephone Encounter >> Nov 22, 2017 11:14 AM Bea Graff, NT wrote: CRM for notification. See Telephone encounter for: 11/22/17. Mondie with [facility] Dept calling to check status on the paperwork being filled out that she faxed over on 11/16/17 for the CAP program. Would have been sent over by Alisia Ferrari. 11 pages long. Please call to update at [phone]

## 2017-11-22 NOTE — Telephone Encounter (Signed)
ppw has been completed and sent up front to fax. I have called and let them know

## 2017-11-22 NOTE — Telephone Encounter (Signed)
See note

## 2017-11-22 NOTE — Progress Notes (Signed)
Heather [name] is a 77 y.o. female is here for an acute visit.  History of Present Illness:   HPI: Patient complains of several days of suprapubic abdominal pain that is now radiating to both sides of her back.  Mild nausea.  No fever.  No bowel changes.  No burning with urination but she does have decreased urination.  No treatment.  Health Maintenance Due  Topic Date Due  . OPHTHALMOLOGY EXAM  01/01/1950  . TETANUS/TDAP  01/02/1959  . DEXA SCAN  01/01/2005  . PNA vac Low Risk Adult (2 of 2 - PCV13) 05/27/2017   Depression screen [facility] 2/9 08/03/2017 05/27/2016 05/06/2016  Decreased Interest 0 0 0  Down, Depressed, Hopeless 0 0 0  PHQ - 2 Score 0 0 0  Altered sleeping - 1 0  Tired, decreased energy - 0 0  Change in appetite - 0 0  Feeling bad or failure about yourself  - 0 0  Trouble concentrating - 0 0  Moving slowly or fidgety/restless - 0 0  Suicidal thoughts - 0 0  PHQ-9 Score - 1 0   PMHx, SurgHx, SocialHx, FamHx, Medications, and Allergies were reviewed in the Visit Navigator and updated as appropriate.   Patient Active Problem List   Diagnosis Date Noted  . CKD stage 3 due to type 2 diabetes mellitus ([place]) 10/07/2017  . Sinus node dysfunction ([place]) 10/07/2017  . Mild cognitive impairment 09/29/2017  . Osteoarthritis of cervical spine 06/04/2017  . Imbalance 05/30/2017  . Controlled type 2 diabetes mellitus with chronic kidney disease, without long-term current use of insulin ([place]) 04/27/2017  . Scoliosis of lumbar spine 06/02/2016  . Complete tear of right rotator cuff 06/02/2016  . Neurogenic claudication due to lumbar spinal stenosis 05/06/2016  . Right shoulder pain 05/06/2016  . Sjogren's syndrome ([place]) 04/27/2016  . Xerostomia 04/27/2016  . Postmenopausal atrophic vaginitis 09/16/2015  . CKD (chronic kidney disease) stage 4, GFR 15-29 ml/min (HCC) 05/06/2015  . Bilateral hip pain 05/06/2015  . Seborrheic keratoses 05/06/2015  . Pacemaker-Medtronic  02/16/2014  . Crohn's disease ([place]) 02/16/2014  . OSA (obstructive sleep apnea) 01/29/2014  . Pulmonary hypertension ([place]) 01/29/2014  . CAD (coronary artery disease) 01/29/2014  . Hypertension associated with diabetes ([place]) 01/29/2014  . Diastolic CHF ([place]) 54/27/0623   Social History   Tobacco Use  . Smoking status: Never Smoker  . Smokeless tobacco: Never Used  Substance Use Topics  . Alcohol use: No  . Drug use: No   Current Medications and Allergies:   Current Outpatient Medications:  .  albuterol (PROVENTIL HFA;VENTOLIN HFA) 108 (90 Base) MCG/ACT inhaler, Inhale 2 puffs into the lungs every 6 (six) hours as needed for wheezing or shortness of breath., Disp: , Rfl:  .  amLODipine (NORVASC) 5 MG tablet, TAKE ONE TABLET BY MOUTH DAILY, Disp: 90 tablet, Rfl: 2 .  aspirin 81 MG EC tablet, Take 1 tablet (81 mg total) by mouth daily., Disp: 30 tablet, Rfl: 5 .  carvedilol (COREG) 6.25 MG tablet, TAKE ONE TABLET BY MOUTH TWICE DAILY WITH A MEAL, Disp: 180 tablet, Rfl: 2 .  FLUoxetine (PROZAC) 20 MG capsule, Take 1 capsule (20 mg total) by mouth every morning., Disp: 90 capsule, Rfl: 3 .  fluticasone (FLONASE) 50 MCG/ACT nasal spray, Place 2 sprays into both nostrils daily., Disp: 16 g, Rfl: 6 .  fluticasone (FLOVENT HFA) 220 MCG/ACT inhaler, Inhale 1 puff into the lungs 2 (two) times daily., Disp: 1 Inhaler, Rfl: 12 .  furosemide (LASIX) 40 MG tablet, Take one tablet (40 mg) by mouth once daily as needed for swelling/ shortness of breath. Please make appt with Dr.Skains. 1st attempt, Disp: 30 tablet, Rfl: 0 .  gabapentin (NEURONTIN) 100 MG capsule, Take 2 capsules (200 mg total) by mouth 2 (two) times daily., Disp: 360 capsule, Rfl: 3 .  hydrochlorothiazide (MICROZIDE) 12.5 MG capsule, Take 1 capsule (12.5 mg total) by mouth daily., Disp: 30 capsule, Rfl: 7 .  losartan (COZAAR) 50 MG tablet, Take 1 tablet (50 mg total) by mouth daily., Disp: 90 tablet, Rfl: 2 .  pantoprazole (PROTONIX)  40 MG tablet, Take 1 tablet (40 mg total) by mouth daily., Disp: 90 tablet, Rfl: 3 .  Polyethyl Glycol-Propyl Glycol (SYSTANE) 0.4-0.3 % SOLN, Apply 2 drops to eye as needed., Disp: 10 mL, Rfl: 0 .  sodium chloride (OCEAN) 0.65 % SOLN nasal spray, Place 1 spray into both nostrils as needed (dry nose)., Disp: 30 mL, Rfl: 0 .  sucralfate (CARAFATE) 1 g tablet, Take 1 tablet four times daily - with meals and at bedtime, Disp: 120 tablet, Rfl: 1 .  traZODone (DESYREL) 50 MG tablet, Take 1/2 to 1 tablet at bedtime as needed for sleep, Disp: 30 tablet, Rfl: 3 .  nitrofurantoin, macrocrystal-monohydrate, (MACROBID) 100 MG capsule, Take 1 capsule (100 mg total) by mouth 2 (two) times daily., Disp: 20 capsule, Rfl: 0 .  phenazopyridine (PYRIDIUM) 100 MG tablet, Take 1 tablet (100 mg total) by mouth 3 (three) times daily as needed for pain., Disp: 10 tablet, Rfl: 0   Allergies  Allergen Reactions  . Amoxicillin-Pot Clavulanate Swelling and Rash   Review of Systems   Pertinent items are noted in the HPI. Otherwise, ROS is negative.  Vitals:   Vitals:   11/22/17 1416  BP: 112/62  Pulse: 86  Temp: 98.2 F (36.8 C)  TempSrc: Oral  SpO2: 95%  Weight: 179 lb 9.6 oz (81.5 kg)  Height: 5' 2"  (1.575 m)     Body mass index is 32.85 kg/m.   Physical Exam:   Physical Exam  Constitutional: She appears well-nourished.  HENT:  Head: Normocephalic and atraumatic.  Eyes: Pupils are equal, round, and reactive to light. EOM are normal.  Neck: Normal range of motion. Neck supple.  Cardiovascular: Normal rate, regular rhythm, normal heart sounds and intact distal pulses.  Pulmonary/Chest: Effort normal.  Abdominal: Soft. There is tenderness in the suprapubic area. There is CVA tenderness.  Skin: Skin is warm.  Psychiatric: She has a normal mood and affect. Her behavior is normal.  Nursing note and vitals reviewed.   Results for orders placed or performed in visit on 11/22/17  POCT urinalysis  dipstick  Result Value Ref Range   Color, UA Yellow    Clarity, UA Hazy    Glucose, UA Negative    Bilirubin, UA 1+    Ketones, UA Negative    Spec Grav, UA 1.025 1.010 - 1.025   Blood, UA Negative    pH, UA 6.0 5.0 - 8.0   Protein, UA Negative    Urobilinogen, UA 1.0 0.2 or 1.0 E.U./dL   Nitrite, UA Negative    Leukocytes, UA Moderate (2+) (A) Negative   Appearance     Odor     Assessment and Plan:   Leylany was seen today for abdominal pain.  Diagnoses and all orders for this visit:  Lower abdominal pain -     POCT urinalysis dipstick -     DG Abd  1 View; Future  Acute pyelonephritis -     phenazopyridine (PYRIDIUM) 100 MG tablet; Take 1 tablet (100 mg total) by mouth 3 (three) times daily as needed for pain. -     nitrofurantoin, macrocrystal-monohydrate, (MACROBID) 100 MG capsule; Take 1 capsule (100 mg total) by mouth 2 (two) times daily.   . Reviewed expectations re: course of current medical issues. . Discussed self-management of symptoms. . Outlined signs and symptoms indicating need for more acute intervention. . Patient verbalized understanding and all questions were answered. Marland Kitchen Health Maintenance issues including appropriate healthy diet, exercise, and smoking avoidance were discussed with patient. . See orders for this visit as documented in the electronic medical record. . Patient received an After Visit Summary.  Briscoe Deutscher, DO [place] [place] 11/22/2017  Future Appointments  Date Time Provider [place]  12/06/2017  9:00 AM Magnus Sinning, MD [facility] None  01/05/2018 10:20 AM Briscoe Deutscher, DO [facility]  02/09/2018  9:15 AM [facility] DEVICE REMOTES [facility]  05/17/2018 11:20 AM Gerda Diss, DO [facility]

## 2017-11-25 DIAGNOSIS — H268 Other specified cataract: Secondary | ICD-10-CM | POA: Diagnosis not present

## 2017-11-25 DIAGNOSIS — D369 Benign neoplasm, unspecified site: Secondary | ICD-10-CM | POA: Diagnosis not present

## 2017-11-25 DIAGNOSIS — H2511 Age-related nuclear cataract, right eye: Secondary | ICD-10-CM | POA: Diagnosis not present

## 2017-11-26 ENCOUNTER — Encounter: Payer: Self-pay | Admitting: Sports Medicine

## 2017-12-06 ENCOUNTER — Encounter (INDEPENDENT_AMBULATORY_CARE_PROVIDER_SITE_OTHER): Payer: Self-pay | Admitting: Physical Medicine and Rehabilitation

## 2017-12-06 ENCOUNTER — Ambulatory Visit (INDEPENDENT_AMBULATORY_CARE_PROVIDER_SITE_OTHER): Payer: Medicare Other

## 2017-12-06 ENCOUNTER — Ambulatory Visit (INDEPENDENT_AMBULATORY_CARE_PROVIDER_SITE_OTHER): Payer: Medicare Other | Admitting: Physical Medicine and Rehabilitation

## 2017-12-06 VITALS — BP 122/73 | HR 74

## 2017-12-06 DIAGNOSIS — M5416 Radiculopathy, lumbar region: Secondary | ICD-10-CM | POA: Diagnosis not present

## 2017-12-06 MED ORDER — METHYLPREDNISOLONE ACETATE 80 MG/ML IJ SUSP
80.0000 mg | Freq: Once | INTRAMUSCULAR | Status: AC
  Administered 2017-12-06: 80 mg

## 2017-12-06 NOTE — Progress Notes (Signed)
 .  Numeric Pain Rating Scale and Functional Assessment Average Pain 7   In the last MONTH (on 0-10 scale) has pain interfered with the following?  1. General activity like being  able to carry out your everyday physical activities such as walking, climbing stairs, carrying groceries, or moving a chair?  Rating(4)   +Driver, -BT, -Dye Allergies.

## 2017-12-06 NOTE — Progress Notes (Signed)
Heather [name] - 77 y.o. female MRN [id number]  Date of birth: Sep 16, 1940  Office Visit Note: Visit Date: 12/06/2017 PCP: Briscoe Deutscher, DO Referred by: Briscoe Deutscher, DO  Subjective: Chief Complaint  Patient presents with  . Lower Back - Pain  . Right Leg - Pain  . Left Leg - Pain   HPI: Mrs. Heather [name] is a 77 year old native Spanish speaker who is here today with an interpreter and her daughter.  We last saw her for her cervical spine injection which did seem to help.  We saw her in October for lumbar epidural injection.  Patient got good relief with prior epidural injections with complaints of back pain radiating to both hips and legs mostly on the right.  She has significant scoliosis and degenerative lumbar spine.  She has not had MRI did not fact that she does have a pacemaker.  She has not had a CT scan or CT myelogram of the lumbar spine.  I think the fact that it seemed to help her so much the first time it is worth repeating.  She will call us back in the future for possible repeat versus getting imaging done depending on her relief.  She can also call myself or Dr. Paulla Fore.  Dr. Paulla Fore still manages her from an orthopedic/sports medicine standpoint.  She has not had any focal weakness or bowel or bladder changes.  Worsening pain with movement but exercise does make it somewhat better.  Her average pain is 7 out of 10   ROS Otherwise per HPI.  Assessment & Plan: Visit Diagnoses:  1. Lumbar radiculopathy     Plan: No additional findings.   Meds & Orders:  Meds ordered this encounter  Medications  . methylPREDNISolone acetate (DEPO-MEDROL) injection 80 mg    Orders Placed This Encounter  Procedures  . XR C-ARM NO REPORT  . Epidural Steroid injection    Follow-up: Return if symptoms worsen or fail to improve, for Dr. Paulla Fore, consider Lumbar CT/Myelo.   Procedures: No procedures performed  Lumbar Epidural Steroid Injection - Interlaminar Approach with Fluoroscopic  Guidance  Patient: Heather [name]      Date of Birth: 10-20-1977 MRN: [id number] PCP: Briscoe Deutscher, DO      Visit Date: 12/06/2017   Universal Protocol:     Consent Given By: the patient  Position: PRONE  Additional Comments: Vital signs were monitored before and after the procedure. Patient was prepped and draped in the usual sterile fashion. The correct patient, procedure, and site was verified.   Injection Procedure Details:  Procedure Site One Meds Administered:  Meds ordered this encounter  Medications  . methylPREDNISolone acetate (DEPO-MEDROL) injection 80 mg     Laterality: Right  Location/Site:  L5-S1  Needle size: 20 G  Needle type: Tuohy  Needle Placement: Paramedian epidural  Findings:   -Comments: Excellent flow of contrast into the epidural space.  Procedure Details: Using a paramedian approach from the side mentioned above, the region overlying the inferior lamina was localized under fluoroscopic visualization and the soft tissues overlying this structure were infiltrated with 4 ml. of 1% Lidocaine without Epinephrine. The Tuohy needle was inserted into the epidural space using a paramedian approach.   The epidural space was localized using loss of resistance along with lateral and bi-planar fluoroscopic views.  After negative aspirate for air, blood, and CSF, a 2 ml. volume of Isovue-250 was injected into the epidural space and the flow of contrast was observed. Radiographs were obtained  for documentation purposes.    The injectate was administered into the level noted above.   Additional Comments:  The patient tolerated the procedure well Dressing: Band-Aid    Post-procedure details: Patient was observed during the procedure. Post-procedure instructions were reviewed.  Patient left the clinic in stable condition.   Clinical History: LUMBAR SPINE -3 VIEW  COMPARISON: None.  FINDINGS: Five lumbar type vertebral bodies are well  visualized. A scoliosis concave to the right is noted. Disc space narrowing is noted at all levels from L2-S1. Mild anterolisthesis of L4 on L5 is noted of a degenerative nature. No pars defects are seen. No soft tissue abnormality is noted.  IMPRESSION: Multilevel degenerative change without acute abnormality.   Electronically Signed By: Inez Catalina M.D. On: 05/20/2016 13:35   She reports that she has never smoked. She has never used smokeless tobacco.  Recent Labs    10/05/17 1129  HGBA1C 6.2    Objective:  VS:  HT:    WT:   BMI:     BP:122/73  HR:74bpm  TEMP: ( )  RESP:  Physical Exam  Ortho Exam Imaging: Xr C-arm No Report  Result Date: 12/06/2017 Please see Notes or Procedures tab for imaging impression.   Past Medical/Family/Surgical/Social History: Medications & Allergies reviewed per EMR, new medications updated. Patient Active Problem List   Diagnosis Date Noted  . CKD stage 3 due to type 2 diabetes mellitus ([place]) 10/07/2017  . Sinus node dysfunction ([place]) 10/07/2017  . Mild cognitive impairment 09/29/2017  . Osteoarthritis of cervical spine 06/04/2017  . Imbalance 05/30/2017  . Controlled type 2 diabetes mellitus with chronic kidney disease, without long-term current use of insulin ([place]) 04/27/2017  . Scoliosis of lumbar spine 06/02/2016  . Complete tear of right rotator cuff 06/02/2016  . Neurogenic claudication due to lumbar spinal stenosis 05/06/2016  . Right shoulder pain 05/06/2016  . Sjogren's syndrome ([place]) 04/27/2016  . Xerostomia 04/27/2016  . Postmenopausal atrophic vaginitis 09/16/2015  . CKD (chronic kidney disease) stage 4, GFR 15-29 ml/min (HCC) 05/06/2015  . Bilateral hip pain 05/06/2015  . Seborrheic keratoses 05/06/2015  . 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 Crohn's disease ([place]) 02/16/2014  . OSA (obstructive sleep apnea) 01/29/2014  . Pulmonary hypertension ([place]) 01/29/2014  . CAD (coronary artery disease) 01/29/2014  .  Hypertension associated with diabetes ([place]) 01/29/2014  . Diastolic CHF ([place]) 24/40/1027   Past Medical History:  Diagnosis Date  . CHF (congestive heart failure) ([place])   . Chronic back pain   . Crohn's disease ([place])   . GERD (gastroesophageal reflux disease)   . High cholesterol   . History of diabetes mellitus, type II   . History of echocardiogram    Echo 12/16: EF 60-65%, no RWMA, Gr 2 DD, mild AS (mean 13 mmHg), mild AI, MAC, trivial MS, mild MR, mild LAE, mild RVE, mild to mod TR, PASP 36 mmHg  . Hypertension   . Migraine    "1-2 times/yr" (02/16/2014)  . Murmur, cardiac   . OSA on CPAP   . 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 Pulmonary hypertension ([place])   . Sinus bradycardia 01/29/2014  . Skin cancer 2003   "3 cut off face" (02/16/2014)   Family History  Adopted: Yes  Problem Relation Age of Onset  . Hypertension Mother   . Heart disease Mother    Past Surgical History:  Procedure Laterality Date  . CARDIAC CATHETERIZATION  1995; 2005  . CATARACT EXTRACTION Bilateral 1982  . ESOPHAGOGASTRODUODENOSCOPY (EGD) WITH PROPOFOL N/A  12/28/2016   Procedure: ESOPHAGOGASTRODUODENOSCOPY (EGD) WITH PROPOFOL;  Surgeon: Otis Brace, MD;  Location: [facility] ENDOSCOPY;  Service: Gastroenterology;  Laterality: N/A;  . INSERT / REPLACE / REMOVE PACEMAKER  02/14/2014  . PERMANENT PACEMAKER INSERTION N/A 02/14/2014   Procedure: PERMANENT PACEMAKER INSERTION;  Surgeon: Deboraha Sprang, MD;  Location[facility] LAB;  Service: Cardiovascular;  Laterality: N/A;  . SKIN CANCER EXCISION  2003    "face X 3" (02/16/2014)  . TEMPORARY PACEMAKER INSERTION N/A 02/12/2014   Procedure: TEMPORARY PACEMAKER INSERTION;  Surgeon: Lorretta Harp, MD;  Location: [facility] CATH LAB;  Service: Cardiovascular;  Laterality: N/A;  . VAGINAL HYSTERECTOMY  1972   Social History   Occupational History  . Not on file  Tobacco Use  . Smoking status: Never Smoker  . Smokeless tobacco: Never Used  Substance and Sexual Activity    . Alcohol use: No  . Drug use: No  . Sexual activity: Never

## 2017-12-06 NOTE — Procedures (Signed)
Lumbar Epidural Steroid Injection - Interlaminar Approach with Fluoroscopic Guidance  Patient: Heather [name]      Date of Birth: [birth year] MRN: [id number] PCP: Briscoe Deutscher, DO      Visit Date: 12/06/2017   Universal Protocol:     Consent Given By: the patient  Position: PRONE  Additional Comments: Vital signs were monitored before and after the procedure. Patient was prepped and draped in the usual sterile fashion. The correct patient, procedure, and site was verified.   Injection Procedure Details:  Procedure Site One Meds Administered:  Meds ordered this encounter  Medications  . methylPREDNISolone acetate (DEPO-MEDROL) injection 80 mg     Laterality: Right  Location/Site:  L5-S1  Needle size: 20 G  Needle type: Tuohy  Needle Placement: Paramedian epidural  Findings:   -Comments: Excellent flow of contrast into the epidural space.  Procedure Details: Using a paramedian approach from the side mentioned above, the region overlying the inferior lamina was localized under fluoroscopic visualization and the soft tissues overlying this structure were infiltrated with 4 ml. of 1% Lidocaine without Epinephrine. The Tuohy needle was inserted into the epidural space using a paramedian approach.   The epidural space was localized using loss of resistance along with lateral and bi-planar fluoroscopic views.  After negative aspirate for air, blood, and CSF, a 2 ml. volume of Isovue-250 was injected into the epidural space and the flow of contrast was observed. Radiographs were obtained for documentation purposes.    The injectate was administered into the level noted above.   Additional Comments:  The patient tolerated the procedure well Dressing: Band-Aid    Post-procedure details: Patient was observed during the procedure. Post-procedure instructions were reviewed.  Patient left the clinic in stable condition.

## 2017-12-06 NOTE — Patient Instructions (Signed)

## 2017-12-09 ENCOUNTER — Telehealth: Payer: Self-pay | Admitting: Family Medicine

## 2017-12-09 ENCOUNTER — Other Ambulatory Visit: Payer: Self-pay

## 2017-12-09 DIAGNOSIS — L989 Disorder of the skin and subcutaneous tissue, unspecified: Secondary | ICD-10-CM

## 2017-12-09 NOTE — Telephone Encounter (Signed)
Referral placed.

## 2017-12-09 NOTE — Telephone Encounter (Signed)
[facility] calling to ask for information for referral. Patient was referred to [facility] for Dermatology- ended up being sent to [facility].  Asking if new referral can be placed directly to [place]. Please advise.

## 2017-12-13 DIAGNOSIS — D485 Neoplasm of uncertain behavior of skin: Secondary | ICD-10-CM | POA: Diagnosis not present

## 2017-12-13 DIAGNOSIS — D369 Benign neoplasm, unspecified site: Secondary | ICD-10-CM | POA: Diagnosis not present

## 2017-12-13 DIAGNOSIS — D229 Melanocytic nevi, unspecified: Secondary | ICD-10-CM | POA: Diagnosis not present

## 2017-12-13 DIAGNOSIS — C44311 Basal cell carcinoma of skin of nose: Secondary | ICD-10-CM | POA: Diagnosis not present

## 2017-12-17 DIAGNOSIS — H9113 Presbycusis, bilateral: Secondary | ICD-10-CM | POA: Diagnosis not present

## 2017-12-17 DIAGNOSIS — M542 Cervicalgia: Secondary | ICD-10-CM | POA: Diagnosis not present

## 2018-01-05 ENCOUNTER — Ambulatory Visit (INDEPENDENT_AMBULATORY_CARE_PROVIDER_SITE_OTHER): Payer: Medicare Other | Admitting: Family Medicine

## 2018-01-05 ENCOUNTER — Ambulatory Visit (INDEPENDENT_AMBULATORY_CARE_PROVIDER_SITE_OTHER): Payer: Medicare Other

## 2018-01-05 ENCOUNTER — Encounter: Payer: Self-pay | Admitting: Family Medicine

## 2018-01-05 VITALS — BP 130/62 | HR 89 | Temp 98.6°F | Ht 62.0 in | Wt 176.0 lb

## 2018-01-05 DIAGNOSIS — I152 Hypertension secondary to endocrine disorders: Secondary | ICD-10-CM

## 2018-01-05 DIAGNOSIS — E1159 Type 2 diabetes mellitus with other circulatory complications: Secondary | ICD-10-CM | POA: Diagnosis not present

## 2018-01-05 DIAGNOSIS — E1169 Type 2 diabetes mellitus with other specified complication: Secondary | ICD-10-CM | POA: Diagnosis not present

## 2018-01-05 DIAGNOSIS — K219 Gastro-esophageal reflux disease without esophagitis: Secondary | ICD-10-CM

## 2018-01-05 DIAGNOSIS — E2839 Other primary ovarian failure: Secondary | ICD-10-CM | POA: Diagnosis not present

## 2018-01-05 DIAGNOSIS — I10 Essential (primary) hypertension: Secondary | ICD-10-CM

## 2018-01-05 DIAGNOSIS — E785 Hyperlipidemia, unspecified: Secondary | ICD-10-CM

## 2018-01-05 DIAGNOSIS — Z23 Encounter for immunization: Secondary | ICD-10-CM

## 2018-01-05 DIAGNOSIS — Z1231 Encounter for screening mammogram for malignant neoplasm of breast: Secondary | ICD-10-CM

## 2018-01-05 DIAGNOSIS — R059 Cough, unspecified: Secondary | ICD-10-CM

## 2018-01-05 DIAGNOSIS — R062 Wheezing: Secondary | ICD-10-CM | POA: Diagnosis not present

## 2018-01-05 DIAGNOSIS — E1122 Type 2 diabetes mellitus with diabetic chronic kidney disease: Secondary | ICD-10-CM | POA: Diagnosis not present

## 2018-01-05 DIAGNOSIS — N184 Chronic kidney disease, stage 4 (severe): Secondary | ICD-10-CM | POA: Diagnosis not present

## 2018-01-05 DIAGNOSIS — R05 Cough: Secondary | ICD-10-CM | POA: Diagnosis not present

## 2018-01-05 DIAGNOSIS — Z1239 Encounter for other screening for malignant neoplasm of breast: Secondary | ICD-10-CM

## 2018-01-05 LAB — LIPID PANEL
Cholesterol: 222 mg/dL — ABNORMAL HIGH (ref 0–200)
HDL: 65.8 mg/dL (ref >39.00)
LDL Cholesterol: 128 mg/dL — ABNORMAL HIGH (ref 0–99)
NonHDL: 156.56
Total CHOL/HDL Ratio: 3
Triglycerides: 141 mg/dL (ref 0.0–149.0)
VLDL: 28.2 mg/dL (ref 0.0–40.0)

## 2018-01-05 LAB — COMPREHENSIVE METABOLIC PANEL
ALT: 17 U/L (ref 0–35)
AST: 17 U/L (ref 0–37)
Albumin: 4 g/dL (ref 3.5–5.2)
Alkaline Phosphatase: 101 U/L (ref 39–117)
BUN: 30 mg/dL — ABNORMAL HIGH (ref 6–23)
CO2: 33 mEq/L — ABNORMAL HIGH (ref 19–32)
Calcium: 9.8 mg/dL (ref 8.4–10.5)
Chloride: 103 mEq/L (ref 96–112)
Creatinine, Ser: 1.39 mg/dL — ABNORMAL HIGH (ref 0.40–1.20)
GFR: 38.97 mL/min — ABNORMAL LOW (ref >60.00)
Glucose, Bld: 103 mg/dL — ABNORMAL HIGH (ref 70–99)
Potassium: 4.1 mEq/L (ref 3.5–5.1)
Sodium: 142 mEq/L (ref 135–145)
Total Bilirubin: 0.4 mg/dL (ref 0.2–1.2)
Total Protein: 8 g/dL (ref 6.0–8.3)

## 2018-01-05 LAB — CBC WITH DIFFERENTIAL/PLATELET
Basophils Absolute: 0.1 10*3/uL (ref 0.0–0.1)
Basophils Relative: 0.9 % (ref 0.0–3.0)
Eosinophils Absolute: 0.2 10*3/uL (ref 0.0–0.7)
Eosinophils Relative: 2.2 % (ref 0.0–5.0)
HCT: 45.8 % (ref 36.0–46.0)
Hemoglobin: 15.1 g/dL — ABNORMAL HIGH (ref 12.0–15.0)
Lymphocytes Relative: 24.3 % (ref 12.0–46.0)
Lymphs Abs: 1.7 10*3/uL (ref 0.7–4.0)
MCHC: 33 g/dL (ref 30.0–36.0)
MCV: 89.7 fl (ref 78.0–100.0)
Monocytes Absolute: 0.8 10*3/uL (ref 0.1–1.0)
Monocytes Relative: 10.6 % (ref 3.0–12.0)
Neutro Abs: 4.4 10*3/uL (ref 1.4–7.7)
Neutrophils Relative %: 62 % (ref 43.0–77.0)
Platelets: 223 10*3/uL (ref 150.0–400.0)
RBC: 5.1 Mil/uL (ref 3.87–5.11)
RDW: 15.2 % (ref 11.5–15.5)
WBC: 7.1 10*3/uL (ref 4.0–10.5)

## 2018-01-05 LAB — HEMOGLOBIN A1C: Hgb A1c MFr Bld: 6.5 % (ref 4.6–6.5)

## 2018-01-05 MED ORDER — ATORVASTATIN CALCIUM 20 MG PO TABS
20.0000 mg | ORAL_TABLET | Freq: Every day | ORAL | 3 refills | Status: DC
Start: 2018-01-05 — End: 2018-09-23

## 2018-01-05 MED ORDER — ESOMEPRAZOLE MAGNESIUM 40 MG PO CPDR: 40.0000 mg | DELAYED_RELEASE_CAPSULE | Freq: Every day | ORAL | 3 refills | Status: DC

## 2018-01-05 NOTE — Progress Notes (Signed)
Heather [name] is a 77 y.o. female is here for follow up.  History of Present Illness:   Heather [name], CMA acting as scribe for Dr. Briscoe Deutscher.   HPI: Patient in office for follow up. She has appointment with skin surgeon to have spot removed on face that came back with abnormal biopsy. She had removed 15 years ago but had not had any problems until now. Patient wanted to have information on living will. We will provide her with that information.   Statin medication was disused and explained to patient along with all of her risk factors she has agreed to start that.   She also was informed that she has hernia it causes small amount of pain. She does feel like she has something that is pushing in the area. It happens more than once a week.   Health Maintenance Due  Topic Date Due  . OPHTHALMOLOGY EXAM  01/01/1950  . DEXA SCAN  01/01/2005   Depression screen [facility] 2/9 01/05/2018 08/03/2017 05/27/2016  Decreased Interest 3 0 0  Down, Depressed, Hopeless 0 0 0  PHQ - 2 Score 3 0 0  Altered sleeping 0 - 1  Tired, decreased energy 0 - 0  Change in appetite 0 - 0  Feeling bad or failure about yourself  0 - 0  Trouble concentrating 0 - 0  Moving slowly or fidgety/restless 0 - 0  Suicidal thoughts 0 - 0  PHQ-9 Score 3 - 1  Difficult doing work/chores Not difficult at all - -   PMHx, SurgHx, SocialHx, FamHx, Medications, and Allergies were reviewed in the Visit Navigator and updated as appropriate.   Patient Active Problem List   Diagnosis Date Noted  . CKD stage 3 due to type 2 diabetes mellitus ([place]) 10/07/2017  . Sinus node dysfunction ([place]) 10/07/2017  . Mild cognitive impairment 09/29/2017  . Osteoarthritis of cervical spine 06/04/2017  . Imbalance 05/30/2017  . Controlled type 2 diabetes mellitus with chronic kidney disease, without long-term current use of insulin ([place]) 04/27/2017  . Scoliosis of lumbar spine 06/02/2016  . Complete tear of right rotator cuff 06/02/2016    . Neurogenic claudication due to lumbar spinal stenosis 05/06/2016  . Right shoulder pain 05/06/2016  . Sjogren's syndrome ([place]) 04/27/2016  . Xerostomia 04/27/2016  . Postmenopausal atrophic vaginitis 09/16/2015  . CKD (chronic kidney disease) stage 4, GFR 15-29 ml/min (HCC) 05/06/2015  . Bilateral hip pain 05/06/2015  . Seborrheic keratoses 05/06/2015  . 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 Crohn's disease ([place]) 02/16/2014  . OSA (obstructive sleep apnea) 01/29/2014  . Pulmonary hypertension ([place]) 01/29/2014  . CAD (coronary artery disease) 01/29/2014  . Hypertension associated with diabetes ([place]) 01/29/2014  . Diastolic CHF ([place]) 89/21/1941   Social History   Tobacco Use  . Smoking status: Never Smoker  . Smokeless tobacco: Never Used  Substance Use Topics  . Alcohol use: No  . Drug use: No   Current Medications and Allergies:   Current Outpatient Medications:  .  amLODipine (NORVASC) 5 MG tablet, TAKE ONE TABLET BY MOUTH DAILY, Disp: 90 tablet, Rfl: 2 .  aspirin 81 MG EC tablet, Take 1 tablet (81 mg total) by mouth daily., Disp: 30 tablet, Rfl: 5 .  carvedilol (COREG) 6.25 MG tablet, TAKE ONE TABLET BY MOUTH TWICE DAILY WITH A MEAL, Disp: 180 tablet, Rfl: 2 .  fluticasone (FLOVENT HFA) 220 MCG/ACT inhaler, Inhale 1 puff into the lungs 2 (two) times daily., Disp: 1 Inhaler, Rfl: 12 .  furosemide (LASIX) 40 MG tablet, Take one tablet (40 mg) by mouth once daily as needed for swelling/ shortness of breath. Please make appt with Dr.Skains. 1st attempt, Disp: 30 tablet, Rfl: 0 .  gabapentin (NEURONTIN) 100 MG capsule, Take 2 capsules (200 mg total) by mouth 2 (two) times daily., Disp: 360 capsule, Rfl: 3 .  hydrochlorothiazide (MICROZIDE) 12.5 MG capsule, Take 1 capsule (12.5 mg total) by mouth daily., Disp: 30 capsule, Rfl: 7 .  losartan (COZAAR) 50 MG tablet, Take 1 tablet (50 mg total) by mouth daily., Disp: 90 tablet, Rfl: 2 .  pantoprazole (PROTONIX) 40 MG tablet, Take 1 tablet  (40 mg total) by mouth daily., Disp: 90 tablet, Rfl: 3 .  phenazopyridine (PYRIDIUM) 100 MG tablet, Take 1 tablet (100 mg total) by mouth 3 (three) times daily as needed for pain., Disp: 10 tablet, Rfl: 0 .  Polyethyl Glycol-Propyl Glycol (SYSTANE) 0.4-0.3 % SOLN, Apply 2 drops to eye as needed., Disp: 10 mL, Rfl: 0 .  sucralfate (CARAFATE) 1 g tablet, Take 1 tablet four times daily - with meals and at bedtime, Disp: 120 tablet, Rfl: 1 .  traZODone (DESYREL) 50 MG tablet, Take 1/2 to 1 tablet at bedtime as needed for sleep, Disp: 30 tablet, Rfl: 3   Allergies  Allergen Reactions  . Amoxicillin-Pot Clavulanate Swelling and Rash   Review of Systems   Pertinent items are noted in the HPI. Otherwise, ROS is negative.  Vitals:   Vitals:   01/05/18 1029  BP: 130/62  Pulse: 89  Temp: 98.6 F (37 C)  TempSrc: Oral  SpO2: 96%  Weight: 176 lb (79.8 kg)  Height: 5' 2"  (1.575 m)     Body mass index is 32.19 kg/m.  Physical Exam:   Physical Exam  Constitutional: She appears well-nourished.  HENT:  Head: Normocephalic and atraumatic.  Eyes: Pupils are equal, round, and reactive to light. EOM are normal.  Neck: Normal range of motion. Neck supple.  Cardiovascular: Normal rate, regular rhythm and intact distal pulses.  Murmur heard.  Systolic murmur is present with a grade of 3/6. Pulmonary/Chest: Effort normal.  Cough.  Abdominal: Soft.  Skin: Skin is warm.  Psychiatric: She has a normal mood and affect. Her behavior is normal.  Nursing note and vitals reviewed.  Results for orders placed or performed in visit on 11/22/17  POCT urinalysis dipstick  Result Value Ref Range   Color, UA Yellow    Clarity, UA Hazy    Glucose, UA Negative    Bilirubin, UA 1+    Ketones, UA Negative    Spec Grav, UA 1.025 1.010 - 1.025   Blood, UA Negative    pH, UA 6.0 5.0 - 8.0   Protein, UA Negative    Urobilinogen, UA 1.0 0.2 or 1.0 E.U./dL   Nitrite, UA Negative    Leukocytes, UA  Moderate (2+) (A) Negative   Appearance     Odor      Assessment and Plan:   Heather [name] was seen today for follow-up.  Diagnoses and all orders for this visit:  Hypertension associated with diabetes ([place])  CKD (chronic kidney disease) stage 4, GFR 15-29 ml/min (HCC) -     Cancel: CBC with Differential/Platelet -     Cancel: Comprehensive metabolic panel  Controlled type 2 diabetes mellitus with stage 4 chronic kidney disease, without long-term current use of insulin (HCC) -     Hemoglobin A1c -     CBC with Differential/Platelet -  Comprehensive metabolic panel -     Lipid panel  Hyperlipidemia associated with type 2 diabetes mellitus (HCC) -     Cancel: Lipid panel -     atorvastatin (LIPITOR) 20 MG tablet; Take 1 tablet (20 mg total) by mouth daily.  Need for Tdap vaccination -     Tdap vaccine greater than or equal to 7yo IM  Need for pneumococcal vaccination -     Pneumococcal conjugate vaccine 13-valent  Estrogen deficiency -     DG Bone Density; Future  Cough -     DG Chest 2 View; Future  Screening for breast cancer -     MM 3D SCREEN BREAST BILATERAL; Future  Gastroesophageal reflux disease without esophagitis -     esomeprazole (NEXIUM) 40 MG capsule; Take 1 capsule (40 mg total) by mouth daily.    . Reviewed expectations re: course of current medical issues. . Discussed self-management of symptoms. . Outlined signs and symptoms indicating need for more acute intervention. . Patient verbalized understanding and all questions were answered. Marland Kitchen Health Maintenance issues including appropriate healthy diet, exercise, and smoking avoidance were discussed with patient. . See orders for this visit as documented in the electronic medical record. . Patient received an After Visit Summary.  Briscoe Deutscher, DO [place] [place] [facility] 01/05/2018  Future Appointments  Date Time Provider [place]  02/09/2018  9:15 AM [facility] DEVICE REMOTES [facility]  05/17/2018 11:20 AM Gerda Diss, DO [facility]   CMA served as scribe during this visit. History, Physical, and Plan performed by medical provider. The above documentation has been reviewed and is accurate and complete. Briscoe Deutscher, D.O.

## 2018-01-10 DIAGNOSIS — D485 Neoplasm of uncertain behavior of skin: Secondary | ICD-10-CM | POA: Diagnosis not present

## 2018-01-10 DIAGNOSIS — C44311 Basal cell carcinoma of skin of nose: Secondary | ICD-10-CM | POA: Diagnosis not present

## 2018-01-10 DIAGNOSIS — L821 Other seborrheic keratosis: Secondary | ICD-10-CM | POA: Diagnosis not present

## 2018-01-24 ENCOUNTER — Telehealth: Payer: Self-pay

## 2018-01-24 NOTE — Telephone Encounter (Signed)
Per cover my meds I have tried to get approval for Nexium for peptic ulcer. Per cover my meds   Cannot find matching patient. Please confirm patient's insurance information or call the number on the back of the patient's insurance card Need to call patient and get member ID on Part D

## 2018-01-31 ENCOUNTER — Telehealth (INDEPENDENT_AMBULATORY_CARE_PROVIDER_SITE_OTHER): Payer: Self-pay | Admitting: Physical Medicine and Rehabilitation

## 2018-01-31 NOTE — Telephone Encounter (Signed)
Call patient daughter to  Make an appt for patient.  [phone]

## 2018-01-31 NOTE — Telephone Encounter (Signed)
Please advise. Right L5-S1 [place] 12/06/17.

## 2018-01-31 NOTE — Telephone Encounter (Signed)
If lumbar issue again she really need lumbar myelogram and would likely include cervical on there as well since they are going to dural puncture - have seen her for both cervical and lumbar. She has pacer. Could return to see Paulla Fore

## 2018-02-01 NOTE — Telephone Encounter (Signed)
Can you call patient's daughter with message below?

## 2018-02-02 NOTE — Telephone Encounter (Signed)
Called pt and left vm #2 today.

## 2018-02-04 DIAGNOSIS — C44311 Basal cell carcinoma of skin of nose: Secondary | ICD-10-CM | POA: Diagnosis not present

## 2018-02-07 NOTE — Telephone Encounter (Signed)
Called pt and left vm #3.

## 2018-02-09 ENCOUNTER — Telehealth: Payer: Self-pay | Admitting: Cardiology

## 2018-02-09 ENCOUNTER — Ambulatory Visit: Payer: Medicare Other | Admitting: *Deleted

## 2018-02-09 NOTE — Telephone Encounter (Signed)
Left message #4.

## 2018-02-09 NOTE — Telephone Encounter (Signed)
Confirmed remote transmission w/ pt daughter.

## 2018-02-10 ENCOUNTER — Encounter: Payer: Self-pay | Admitting: Cardiology

## 2018-02-10 NOTE — Progress Notes (Signed)
Not received  

## 2018-02-16 NOTE — Telephone Encounter (Signed)
Neck and shoulder pain. Patient is leaving the country next week and will be gone until September. We do not have any openings this week. She will try to call Dr. Nicolasa Ducking office to see what they recommend, and she will call us when she returns in September for a possible myelogram order.

## 2018-02-18 ENCOUNTER — Ambulatory Visit (INDEPENDENT_AMBULATORY_CARE_PROVIDER_SITE_OTHER): Payer: Medicare Other | Admitting: *Deleted

## 2018-02-18 DIAGNOSIS — I495 Sick sinus syndrome: Secondary | ICD-10-CM | POA: Diagnosis not present

## 2018-02-18 DIAGNOSIS — I5032 Chronic diastolic (congestive) heart failure: Secondary | ICD-10-CM

## 2018-02-21 NOTE — Progress Notes (Signed)
Remote pacemaker transmission.   

## 2018-03-01 ENCOUNTER — Ambulatory Visit: Payer: Medicare Other | Admitting: Sports Medicine

## 2018-03-01 DIAGNOSIS — Z0289 Encounter for other administrative examinations: Secondary | ICD-10-CM

## 2018-03-07 ENCOUNTER — Encounter: Payer: Self-pay | Admitting: Sports Medicine

## 2018-03-18 LAB — CUP PACEART REMOTE DEVICE CHECK
Battery Remaining Longevity: 71 mo
Battery Voltage: 3 V
Brady Statistic AP VP Percent: 0.03 %
Brady Statistic AP VS Percent: 97.39 %
Brady Statistic AS VP Percent: 0 %
Brady Statistic AS VS Percent: 2.57 %
Brady Statistic RA Percent Paced: 97.15 %
Brady Statistic RV Percent Paced: 0.04 %
Date Time Interrogation Session: 20190802191133
Implantable Lead Implant Date: 20150729
Implantable Lead Implant Date: 20150729
Implantable Lead Location: 753859
Implantable Lead Location: 753860
Implantable Lead Model: 5076
Implantable Lead Model: 5076
Implantable Pulse Generator Implant Date: 20150729
Lead Channel Impedance Value: 342 Ohm
Lead Channel Impedance Value: 475 Ohm
Lead Channel Impedance Value: 494 Ohm
Lead Channel Impedance Value: 532 Ohm
Lead Channel Pacing Threshold Amplitude: 0.875 V
Lead Channel Pacing Threshold Amplitude: 1 V
Lead Channel Pacing Threshold Pulse Width: 0.4 ms
Lead Channel Pacing Threshold Pulse Width: 0.4 ms
Lead Channel Sensing Intrinsic Amplitude: 1.625 mV
Lead Channel Sensing Intrinsic Amplitude: 1.625 mV
Lead Channel Sensing Intrinsic Amplitude: 11.625 mV
Lead Channel Sensing Intrinsic Amplitude: 11.625 mV
Lead Channel Setting Pacing Amplitude: 2 V
Lead Channel Setting Pacing Amplitude: 2 V
Lead Channel Setting Pacing Pulse Width: 0.4 ms
Lead Channel Setting Sensing Sensitivity: 2 mV

## 2018-04-08 ENCOUNTER — Ambulatory Visit: Payer: Medicare Other | Admitting: Family Medicine

## 2018-04-27 DIAGNOSIS — L720 Epidermal cyst: Secondary | ICD-10-CM | POA: Diagnosis not present

## 2018-04-27 DIAGNOSIS — Z85828 Personal history of other malignant neoplasm of skin: Secondary | ICD-10-CM | POA: Diagnosis not present

## 2018-04-27 DIAGNOSIS — L821 Other seborrheic keratosis: Secondary | ICD-10-CM | POA: Diagnosis not present

## 2018-04-27 DIAGNOSIS — D489 Neoplasm of uncertain behavior, unspecified: Secondary | ICD-10-CM | POA: Diagnosis not present

## 2018-05-06 DIAGNOSIS — L819 Disorder of pigmentation, unspecified: Secondary | ICD-10-CM | POA: Diagnosis not present

## 2018-05-17 ENCOUNTER — Ambulatory Visit: Payer: Self-pay

## 2018-05-17 ENCOUNTER — Encounter: Payer: Self-pay | Admitting: Sports Medicine

## 2018-05-17 ENCOUNTER — Ambulatory Visit (INDEPENDENT_AMBULATORY_CARE_PROVIDER_SITE_OTHER): Payer: Medicare Other | Admitting: Sports Medicine

## 2018-05-17 ENCOUNTER — Ambulatory Visit (INDEPENDENT_AMBULATORY_CARE_PROVIDER_SITE_OTHER): Payer: Medicare Other

## 2018-05-17 VITALS — BP 126/68 | HR 85 | Ht 62.0 in | Wt 184.4 lb

## 2018-05-17 DIAGNOSIS — M25519 Pain in unspecified shoulder: Secondary | ICD-10-CM

## 2018-05-17 DIAGNOSIS — M4722 Other spondylosis with radiculopathy, cervical region: Secondary | ICD-10-CM | POA: Diagnosis not present

## 2018-05-17 DIAGNOSIS — M25511 Pain in right shoulder: Secondary | ICD-10-CM

## 2018-05-17 DIAGNOSIS — M542 Cervicalgia: Secondary | ICD-10-CM | POA: Diagnosis not present

## 2018-05-17 DIAGNOSIS — M48062 Spinal stenosis, lumbar region with neurogenic claudication: Secondary | ICD-10-CM

## 2018-05-17 DIAGNOSIS — G8929 Other chronic pain: Secondary | ICD-10-CM | POA: Diagnosis not present

## 2018-05-17 DIAGNOSIS — M19011 Primary osteoarthritis, right shoulder: Secondary | ICD-10-CM

## 2018-05-17 DIAGNOSIS — M4802 Spinal stenosis, cervical region: Secondary | ICD-10-CM | POA: Diagnosis not present

## 2018-05-17 MED ORDER — GABAPENTIN 100 MG PO CAPS: 200.0000 mg | ORAL_CAPSULE | Freq: Two times a day (BID) | ORAL | 5 refills | Status: DC

## 2018-05-17 NOTE — Progress Notes (Signed)
Heather Bond. Heather [name], [facility] - 77 y.o. female MRN [id number]  Date of birth: [birth year]  Visit Date: 05/17/2018  PCP: Briscoe Deutscher, DO   Referred by: Briscoe Deutscher, DO  Scribe for today's visit: Josepha Pigg, CMA     SUBJECTIVE:  Heather [name] is here for Follow-up (back pain) .   HPI:  06/04/17 Heather [name] is an established pt and presenting today for f/u of her B hip and R shoulder pain.  She was last seen on 04/09/17.  She has taken and completed a prednisone taper and is currently taking Gabapentin.  PT was suggested at her last visit but she did not schedule any appointments.  She had a lumbar epidural on 04/29/17.  Pt states that she has been feeling very good but wants to know if she can have another one perhaps in the middle of her lumbar spine.  Pt states that her R shoulder con't to hurt and bother her and does not notice any change in those symptoms.  Pt will be starting home PT next week mainly for balance issues.  11/15/16: Compared to the last office visit, her previously described back pain symptoms are improving. 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  Current symptoms are mild & are radiating to R hip and into the right leg. She also c/o pain in the L hip but this does NOT radiate into the leg. She is also experiencing pain in the groin region.  She has been taking Gabapentin BID.  She had a lumbar epidural on 04/29/17 and a C7-T1 epidural on 07/05/17.  She had a CT of her c-spine on 06/09/17.   05/17/2018: Compared to the last office visit, her previously described symptoms are improving. Current symptoms are mild & are radiating to the R hip and R leg but not as bad as it was before.  She has been Gabapentin BID. She was prescribed Prednisone taper while in [place] in August for shoulder pain, this seemed to also help with LBP.  She received interlaminar inj L5-S1 with Dr.  Ernestina Patches 12/06/2017.   Her R shoulder pain symptoms INITIALLY: Began July 2019 and MOI is known, no past injury to the shoulder.  Described as moderate (7/10), can be severe at times, stabbing pain, radiating to the R arm and right-sided ribs.  Worsened with reaching up.  Improved with rest.  Additional associated symptoms include: She reports some pain in her neck. She reports decreased ROM in the shoulder, popping with movement. She c/o n/t in the R arm, hand, and 2-4th fingers. Pain is mostly posterior.     At this time symptoms are worsening compared to onset. She has taking Prednisone taper and notes some improvement. She has tried Tylenol and Naproxen with minimal relief. She has tried icing the shoulder with temporary relief. She was experiencing night time disturbance prior to taking Prednisone but that has resolved. She does have pain and stiffness when first getting up in the morning.   She was seen by chiropractor in August ([facility]) and was told that she has a pinched nerve.   No recent XR of neck or shoulder.   She has pacemaker.   ROS: Denies night time disturbances. Denies fevers, chills, or night sweats. Denies unexplained weight loss. Denies changes in bowel or bladder habits. Denies recent unreported falls. Denies new or worsening dyspnea or wheezing. Denies headaches or dizziness.  Reports numbness, tingling or weakness in the R upper extremities.  Denies dizziness or presyncopal episodes Denies lower extremity edema - recently had fluid pill increased by cardiology.    HISTORY:  Prior history reviewed and updated per electronic medical record.  Social History   Occupational History  . Not on file  Tobacco Use  . Smoking status: Never Smoker  . Smokeless tobacco: Never Used  Substance and Sexual Activity  . Alcohol use: No  . Drug use: No  . Sexual activity: Never   Social History   Social History Narrative   Moved here from [place] in  October 2013.   Right handed   1 cup of caffeine daily     DATA OBTAINED & REVIEWED:   Recent Labs    10/05/17 1129 01/05/18 1102  HGBA1C 6.2 6.5   Problem  Primary Osteoarthritis of Right Shoulder   .   OBJECTIVE:  VS:  HT:5' 2"  (157.5 cm)   WT:184 lb 6.4 oz (83.6 kg)  BMI:33.72    BP:126/68  HR:85bpm  TEMP: ( )  RESP:95 %   PHYSICAL EXAM: CONSTITUTIONAL: Well-developed, Well-nourished and In no acute distress PSYCHIATRIC: Alert & appropriately interactive. and Not depressed or anxious appearing. RESPIRATORY: No increased work of breathing and Trachea Midline EYES: Pupils are equal., EOM intact without nystagmus. and No scleral icterus.  VASCULAR EXAM: Warm and well perfused NEURO: unremarkable  MSK Exam: Right shoulder  Well aligned, no significant deformity. No overlying skin changes. Generalized tenderness over the entire shoulder complex with a moderate amount of focality at the level of the posterior capsule.  Minimal to moderate TTP over the [facility] joint. TTP over    RANGE OF MOTION & STRENGTH  Limited and painful: Shoulder flexion, abduction limited by 15 to 20 degrees in all planes.   SPECIALITY TESTING:  Moderate pain with brachial plexus squeeze and with arm squeeze test but this is improved compared to the past and there is more focal pain directly over the shoulder joint.  Negative Spurling's compression test, Lhermitte's compression test.  Cervical range of motion is limited.    ASSESSMENT   1. Neck and shoulder pain   2. Osteoarthritis of spine with radiculopathy, cervical region   3. Chronic right shoulder pain   4. Primary osteoarthritis of right shoulder   5. Neurogenic claudication due to lumbar spinal stenosis     PLAN:  Pertinent additional documentation may be included in corresponding procedure notes, imaging studies, problem based documentation and patient instructions.  Procedures:  . US Guided Injection per procedure  note  Medications:  Meds ordered this encounter  Medications  . gabapentin (NEURONTIN) 100 MG capsule    Sig: Take 2 capsules (200 mg total) by mouth 2 (two) times daily.    Dispense:  360 capsule    Refill:  5   Discussion/Instructions: Primary osteoarthritis of right shoulder Multifactorial right upper extremity pain.  Fairly significant osteoarthritis based on x-rays today.  Intra-articular injection performed today and she should continue with the gabapentin.  Marland Kitchen Refill on gabapentin.  No significant sedating effects. . Continue with gentle range of motion of the shoulder. . Continue previously prescribed home exercise program.  . Discussed red flag symptoms that warrant earlier emergent evaluation and patient voices understanding. . Activity modifications and the importance of avoiding exacerbating activities (limiting pain to no more than a 4 / 10 during or following activity) recommended and discussed.  Follow-up:  . Return in about 6 months (around 11/16/2018).  Marland Kitchen  At follow up will plan to consider: repeat corticosteroid injections or We will plan to continue with prescribing gabapentin for her.     CMA/ATC served as Education administrator during this visit. History, Physical, and Plan performed by medical provider. Documentation and orders reviewed and attested to.      Gerda Diss, [facility] Physician

## 2018-05-17 NOTE — Procedures (Signed)
PROCEDURE NOTE:  Ultrasound Guided: Injection: Right shoulder Images were obtained and interpreted by myself, Teresa Coombs, DO  Images have been saved and stored to PACS system. Images obtained on: GE S7 Ultrasound machine    ULTRASOUND FINDINGS:  Moderate degenerative changes  DESCRIPTION OF PROCEDURE:  The patient's clinical condition is marked by substantial pain and/or significant functional disability. Other conservative therapy has not provided relief, is contraindicated, or not appropriate. There is a reasonable likelihood that injection will significantly improve the patient's pain and/or functional impairment.   After discussing the risks, benefits and expected outcomes of the injection and all questions were reviewed and answered, the patient wished to undergo the above named procedure.  Verbal consent was obtained.  The ultrasound was used to identify the target structure and adjacent neurovascular structures. The skin was then prepped in sterile fashion and the target structure was injected under direct visualization using sterile technique as below:  Single injection performed as below: PREP: Alcohol and Ethel Chloride APPROACH:posterior, single injection, 21g 2 in. INJECTATE: 2 cc 0.5% Marcaine and 2 cc 74m/mL DepoMedrol ASPIRATE: None DRESSING: Band-Aid  Post procedural instructions including recommending icing and warning signs for infection were reviewed.    This procedure was well tolerated and there were no complications.   IMPRESSION: Succesful Ultrasound Guided: Injection

## 2018-05-17 NOTE — Patient Instructions (Addendum)

## 2018-05-17 NOTE — Assessment & Plan Note (Signed)
Multifactorial right upper extremity pain.  Fairly significant osteoarthritis based on x-rays today.  Intra-articular injection performed today and she should continue with the gabapentin.

## 2018-05-18 ENCOUNTER — Encounter: Payer: Self-pay | Admitting: Sports Medicine

## 2018-05-23 ENCOUNTER — Ambulatory Visit (INDEPENDENT_AMBULATORY_CARE_PROVIDER_SITE_OTHER): Payer: Medicare Other | Admitting: *Deleted

## 2018-05-23 ENCOUNTER — Telehealth: Payer: Self-pay

## 2018-05-23 DIAGNOSIS — I495 Sick sinus syndrome: Secondary | ICD-10-CM

## 2018-05-23 NOTE — Telephone Encounter (Signed)
Confirmed remote transmission w/ pt sister.

## 2018-05-25 NOTE — Progress Notes (Signed)
Remote pacemaker transmission.   

## 2018-05-26 ENCOUNTER — Encounter: Payer: Self-pay | Admitting: Cardiology

## 2018-05-30 ENCOUNTER — Ambulatory Visit
Admission: RE | Admit: 2018-05-30 | Discharge: 2018-05-30 | Disposition: A | Discharge status: Home / Self Care | Payer: Medicare Other | Source: Ambulatory Visit | Attending: Family Medicine | Admitting: Family Medicine

## 2018-05-30 DIAGNOSIS — Z1231 Encounter for screening mammogram for malignant neoplasm of breast: Secondary | ICD-10-CM | POA: Diagnosis not present

## 2018-05-30 DIAGNOSIS — E2839 Other primary ovarian failure: Secondary | ICD-10-CM

## 2018-05-30 DIAGNOSIS — M85851 Other specified disorders of bone density and structure, right thigh: Secondary | ICD-10-CM | POA: Diagnosis not present

## 2018-05-30 DIAGNOSIS — Z780 Asymptomatic menopausal state: Secondary | ICD-10-CM | POA: Diagnosis not present

## 2018-05-30 DIAGNOSIS — Z1239 Encounter for other screening for malignant neoplasm of breast: Secondary | ICD-10-CM

## 2018-06-01 ENCOUNTER — Ambulatory Visit: Payer: Medicare Other | Admitting: Nurse Practitioner

## 2018-06-01 ENCOUNTER — Other Ambulatory Visit: Payer: Self-pay | Admitting: Family Medicine

## 2018-06-01 DIAGNOSIS — R928 Other abnormal and inconclusive findings on diagnostic imaging of breast: Secondary | ICD-10-CM

## 2018-06-03 ENCOUNTER — Ambulatory Visit
Admission: RE | Admit: 2018-06-03 | Discharge: 2018-06-03 | Disposition: A | Discharge status: Home / Self Care | Payer: Medicare Other | Source: Ambulatory Visit | Attending: Family Medicine | Admitting: Family Medicine

## 2018-06-03 ENCOUNTER — Other Ambulatory Visit: Payer: Self-pay | Admitting: Family Medicine

## 2018-06-03 DIAGNOSIS — N6320 Unspecified lump in the left breast, unspecified quadrant: Secondary | ICD-10-CM

## 2018-06-03 DIAGNOSIS — R928 Other abnormal and inconclusive findings on diagnostic imaging of breast: Secondary | ICD-10-CM | POA: Diagnosis not present

## 2018-06-03 DIAGNOSIS — N6012 Diffuse cystic mastopathy of left breast: Secondary | ICD-10-CM | POA: Diagnosis not present

## 2018-06-06 ENCOUNTER — Ambulatory Visit (INDEPENDENT_AMBULATORY_CARE_PROVIDER_SITE_OTHER): Payer: Medicare Other | Admitting: Cardiology

## 2018-06-06 ENCOUNTER — Encounter: Payer: Self-pay | Admitting: Cardiology

## 2018-06-06 VITALS — BP 110/60 | HR 79 | Ht 62.0 in | Wt 181.8 lb

## 2018-06-06 DIAGNOSIS — I10 Essential (primary) hypertension: Secondary | ICD-10-CM | POA: Diagnosis not present

## 2018-06-06 DIAGNOSIS — I495 Sick sinus syndrome: Secondary | ICD-10-CM | POA: Diagnosis not present

## 2018-06-06 DIAGNOSIS — I5032 Chronic diastolic (congestive) heart failure: Secondary | ICD-10-CM | POA: Diagnosis not present

## 2018-06-06 DIAGNOSIS — Z950 Presence of cardiac pacemaker: Secondary | ICD-10-CM

## 2018-06-06 NOTE — Progress Notes (Signed)
Cardiology Office Note:    Date:  06/06/2018   ID:  Kacia Halley, [name] 03/29/1941, MRN [id number]  PCP:  Briscoe Deutscher, DO  Cardiologist:  Candee Furbish, MD  Electrophysiologist:  None   Referring MD: Briscoe Deutscher, DO     History of Present Illness:    Heather [name] is a 77 y.o. female here for follow-up of pacemaker, diastolic heart failure and aortic stenosis.  Former patient of Dr. Sherryl Barters.  Dr. Caryl Comes has been following her pacemaker.  Medtronic.  Creatinine 1.4, hemoglobin 15.1 hemoglobin A1c 6.5, LDL 128.  Bendopnea. Cough has been quite chronic.  Raspy cough.  Sometimes she may hear a subtle wheeze coming from her back.  I see Flovent on her prescriptions.    Past Medical History:  Diagnosis Date  . CHF (congestive heart failure) ([place])   . Chronic back pain   . Crohn's disease ([place])   . GERD (gastroesophageal reflux disease)   . High cholesterol   . History of diabetes mellitus, type II   . History of echocardiogram    Echo 12/16: EF 60-65%, no RWMA, Gr 2 DD, mild AS (mean 13 mmHg), mild AI, MAC, trivial MS, mild MR, mild LAE, mild RVE, mild to mod TR, PASP 36 mmHg  . Hypertension   . Migraine    "1-2 times/yr" (02/16/2014)  . Murmur, cardiac   . OSA on CPAP   . 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 Pulmonary hypertension ([place])   . Sinus bradycardia 01/29/2014  . Skin cancer 2003   "3 cut off face" (02/16/2014)    Past Surgical History:  Procedure Laterality Date  . CARDIAC CATHETERIZATION  1995; 2005  . CATARACT EXTRACTION Bilateral 1982  . ESOPHAGOGASTRODUODENOSCOPY (EGD) WITH PROPOFOL N/A 12/28/2016   Procedure: ESOPHAGOGASTRODUODENOSCOPY (EGD) WITH PROPOFOL;  Surgeon: Otis Brace, MD;  Location: [facility];  Service: Gastroenterology;  Laterality: N/A;  . INSERT / REPLACE / REMOVE PACEMAKER  02/14/2014  . PERMANENT PACEMAKER INSERTION N/A 02/14/2014   Procedure: PERMANENT PACEMAKER INSERTION;  Surgeon: Deboraha Sprang, MD;  Location: [facility] CATH  LAB;  Service: Cardiovascular;  Laterality: N/A;  . SKIN CANCER EXCISION  2003    "face X 3" (02/16/2014)  . TEMPORARY PACEMAKER INSERTION N/A 02/12/2014   Procedure: TEMPORARY PACEMAKER INSERTION;  Surgeon: Lorretta Harp, MD;  [facility] C[facility] LAB;  Service: Cardiovascular;  Laterality: N/A;  . VAGINAL HYSTERECTOMY  1972    Current Medications: Current Meds  Medication Sig  . amLODipine (NORVASC) 5 MG tablet TAKE ONE TABLET BY MOUTH DAILY  . aspirin 81 MG EC tablet Take 1 tablet (81 mg total) by mouth daily.  Marland Kitchen atorvastatin (LIPITOR) 20 MG tablet Take 1 tablet (20 mg total) by mouth daily.  . carvedilol (COREG) 6.25 MG tablet TAKE ONE TABLET BY MOUTH TWICE DAILY WITH A MEAL  . fluticasone (FLOVENT HFA) 220 MCG/ACT inhaler Inhale 1 puff into the lungs 2 (two) times daily.  . furosemide (LASIX) 40 MG tablet Take one tablet (40 mg) by mouth once daily as needed for swelling/ shortness of breath. Please make appt with Dr.. 1st attempt  . gabapentin (NEURONTIN) 100 MG capsule Take 2 capsules (200 mg total) by mouth 2 (two) times daily.  . hydrochlorothiazide (MICROZIDE) 12.5 MG capsule Take 1 capsule (12.5 mg total) by mouth daily.  Marland Kitchen losartan (COZAAR) 50 MG tablet Take 1 tablet (50 mg total) by mouth daily.  . pantoprazole (PROTONIX) 40 MG tablet Take 1 tablet (40 mg total) by mouth daily.  Marland Kitchen  phenazopyridine (PYRIDIUM) 100 MG tablet Take 1 tablet (100 mg total) by mouth 3 (three) times daily as needed for pain.  Vladimir Faster Glycol-Propyl Glycol (SYSTANE) 0.4-0.3 % SOLN Apply 2 drops to eye as needed.  . traZODone (DESYREL) 50 MG tablet Take 1/2 to 1 tablet at bedtime as needed for sleep     Allergies:   Amoxicillin-pot clavulanate   Social History   Socioeconomic History  . Marital status: Widowed    Spouse name: Not on file  . Number of children: 5  . Years of education: middle school  . Highest education level: Not on file  Occupational History  . Not on file  Social  Needs  . Financial resource strain: Not on file  . Food insecurity:    Worry: Not on file    Inability: Not on file  . Transportation needs:    Medical: Not on file    Non-medical: Not on file  Tobacco Use  . Smoking status: Never Smoker  . Smokeless tobacco: Never Used  Substance and Sexual Activity  . Alcohol use: No  . Drug use: No  . Sexual activity: Never  Lifestyle  . Physical activity:    Days per week: Not on file    Minutes per session: Not on file  . Stress: Not on file  Relationships  . Social connections:    Talks on phone: Not on file    Gets together: Not on file    Attends religious service: Not on file    Active member of club or organization: Not on file    Attends meetings of clubs or organizations: Not on file    Relationship status: Not on file  Other Topics Concern  . Not on file  Social History Narrative   Moved here from [place] in October 2013.   Right handed   1 cup of caffeine daily     Family History: The patient's family history includes Breast cancer in her sister and sister; Heart disease in her mother; Hypertension in her mother. She was adopted.  ROS:   Please see the history of present illness.    Positive for cough.  No chest pain.  Occasional shortness of breath when bending over.  All other systems reviewed and are negative.  EKGs/Labs/Other Studies Reviewed:    The following studies were reviewed today:  ECHO: 04/18/16 - Left ventricle: The cavity size was normal. There was mild   concentric hypertrophy. Systolic function was normal. The   estimated ejection fraction was in the range of 55% to 60%. Wall   motion was normal; there were no regional wall motion   abnormalities. Doppler parameters are consistent with abnormal   left ventricular relaxation (grade 1 diastolic dysfunction). - Aortic valve: Valve mobility was restricted. There was mild   stenosis. There was mild regurgitation. Valve area (VTI): 1.39   cm^2. Valve  area (Vmax): 1.33 cm^2. Valve area (Vmean): 1.26   cm^2. - Mitral valve: Moderately to severely calcified annulus. There was   mild regurgitation. - Left atrium: The atrium was moderately dilated. - Pulmonary arteries: Systolic pressure was mildly increased. PA   peak pressure: 32 mm Hg (S).  EKG:  EKG is not ordered today.  Recent Labs: 01/05/2018: ALT 17; BUN 30; Creatinine, Ser 1.39; Hemoglobin 15.1; Platelets 223.0; Potassium 4.1; Sodium 142  Recent Lipid Panel    Component Value Date/Time   CHOL 222 (H) 01/05/2018 1102   TRIG 141.0 01/05/2018 1102   HDL 65.80  01/05/2018 1102   CHOLHDL 3 01/05/2018 1102   VLDL 28.2 01/05/2018 1102   LDLCALC 128 (H) 01/05/2018 1102    Physical Exam:    VS:  BP 110/60   Pulse 79   Ht _0  (1.575 m)   Wt 181 lb 12.8 oz (82.5 kg)   LMP  (LMP Unknown)   SpO2 97%   BMI 33.25 kg/m     Wt Readings from Last 3 Encounters:  06/06/18 181 lb 12.8 oz (82.5 kg)  05/17/18 184 lb 6.4 oz (83.6 kg)  01/05/18 176 lb (79.8 kg)     GEN:  Well nourished, well developed in no acute distress HEENT: Normal NECK: No JVD; No carotid bruits LYMPHATICS: No lymphadenopathy CARDIAC: RRR, soft systolic murmur, no rubs, gallops RESPIRATORY:  Clear to auscultation without rales, wheezing or rhonchi  ABDOMEN: Soft, non-tender, non-distended MUSCULOSKELETAL:  No edema; No deformity  SKIN: Warm and dry NEUROLOGIC:  Alert and oriented x 3 PSYCHIATRIC:  Normal affect   ASSESSMENT:    1. Chronic diastolic heart failure ([place])   2. Essential hypertension   3. Pacemaker   4. Sick sinus syndrome (H[facility])    PLAN:    In order of problems listed above:  Chronic diastolic heart failure - Currently appears euvolemic.  She may take her Lasix as needed.  Her cough currently sounds more like bronchitis.  Nonetheless, she could try 2 days or so of furosemide to see if this helps.  Told her that she could try Mucinex.  Her family member present for translation.  She will  also have Dr. Juleen China check this out.  Essential hypertension -Very well controlled current regimen.  Medications reviewed.  Pacemaker -Medtronic, operating well.  Dr. Caryl Comes.  States that she feels much better since this implantation a few years ago.  Sick sinus syndrome -Pacemaker.  One year follow-up  Medication Adjustments/Labs and Tests Ordered: Current medicines are reviewed at length with the patient today.  Concerns regarding medicines are outlined above.  No orders of the defined types were placed in this encounter.  No orders of the defined types were placed in this encounter.   Patient Instructions  Medication Instructions:  The current medical regimen is effective;  continue present plan and medications.  If you need a refill on your cardiac medications before your next appointment, please call your pharmacy.   Follow-Up: At [facility], you and your health needs are our priority.  As part of our continuing mission to provide you with exceptional heart care, we have created designated Provider Care Teams.  These Care Teams include your primary Cardiologist (physician) and Advanced Practice Providers (APPs -  Physician Assistants and Nurse Practitioners) who all work together to provide you with the care you need, when you need it. You will need a follow up appointment in 12 months.  Please call our office 2 months in advance to schedule this appointment.  You may see Candee Furbish, MD or one of the following Advanced Practice Providers on your designated Care Team:   Truitt Merle, NP Cecilie Kicks, NP . Kathyrn Drown, NP  Thank you for choosing [facility]!!         Signed, Candee Furbish, MD  06/06/2018 10:12 AM    [place]

## 2018-06-06 NOTE — Patient Instructions (Signed)
Medication Instructions:  The current medical regimen is effective;  continue present plan and medications.  If you need a refill on your cardiac medications before your next appointment, please call your pharmacy.   Follow-Up: At [facility], you and your health needs are our priority.  As part of our continuing mission to provide you with exceptional heart care, we have created designated Provider Care Teams.  These Care Teams include your primary Cardiologist (physician) and Advanced Practice Providers (APPs -  Physician Assistants and Nurse Practitioners) who all work together to provide you with the care you need, when you need it. You will need a follow up appointment in 12 months.  Please call our office 2 months in advance to schedule this appointment.  You may see Candee Furbish, MD or one of the following Advanced Practice Providers on your designated Care Team:   Truitt Merle, NP Cecilie Kicks, NP . Kathyrn Drown, NP  Thank you for choosing [facility]!!

## 2018-06-27 DIAGNOSIS — Z85828 Personal history of other malignant neoplasm of skin: Secondary | ICD-10-CM | POA: Diagnosis not present

## 2018-06-27 DIAGNOSIS — L821 Other seborrheic keratosis: Secondary | ICD-10-CM | POA: Diagnosis not present

## 2018-06-27 DIAGNOSIS — L814 Other melanin hyperpigmentation: Secondary | ICD-10-CM | POA: Diagnosis not present

## 2018-06-27 DIAGNOSIS — L738 Other specified follicular disorders: Secondary | ICD-10-CM | POA: Diagnosis not present

## 2018-06-28 ENCOUNTER — Ambulatory Visit: Payer: Self-pay

## 2018-06-28 ENCOUNTER — Ambulatory Visit (INDEPENDENT_AMBULATORY_CARE_PROVIDER_SITE_OTHER): Payer: Medicare Other | Admitting: Sports Medicine

## 2018-06-28 ENCOUNTER — Encounter: Payer: Self-pay | Admitting: Sports Medicine

## 2018-06-28 VITALS — BP 110/64 | HR 75 | Ht 62.0 in | Wt 182.2 lb

## 2018-06-28 DIAGNOSIS — M25559 Pain in unspecified hip: Secondary | ICD-10-CM

## 2018-06-28 DIAGNOSIS — M4726 Other spondylosis with radiculopathy, lumbar region: Secondary | ICD-10-CM | POA: Diagnosis not present

## 2018-06-28 DIAGNOSIS — M48062 Spinal stenosis, lumbar region with neurogenic claudication: Secondary | ICD-10-CM | POA: Diagnosis not present

## 2018-06-28 DIAGNOSIS — M5441 Lumbago with sciatica, right side: Secondary | ICD-10-CM | POA: Diagnosis not present

## 2018-06-28 DIAGNOSIS — G8929 Other chronic pain: Secondary | ICD-10-CM | POA: Diagnosis not present

## 2018-06-28 NOTE — Patient Instructions (Signed)

## 2018-06-28 NOTE — Progress Notes (Signed)
Heather [name]. Heather [name], [place] [facility] [place] - 77 y.o. female MRN [id number]  Date of birth: [birth year]  Visit Date:   PCP: Briscoe Deutscher, DO   Referred by: Briscoe Deutscher, DO   SUBJECTIVE:  Chief Complaint  Patient presents with  . Follow-up    Low back pain     HPI: Patient is here with several months worsening of mild right greater than left low back pain and hip pain that is radiating down the lateral aspect of her leg.  She denies any pain that radiates into the foot.  Pain does awaken her at night she reports this is not from laying directly on the hip.  She is previously had an injection with Dr. Ernestina Patches for similar symptoms with good improvement but does report the symptoms do seem to be more in her hip today than in the past.  She is taking gabapentin twice a day and this is not providing significant improvement.  REVIEW OF SYSTEMS: She denies any new different changes in bowel or bladder.  She does report some dysesthesia type symptoms in the right lower extremity but none in the left.  Pt denies any change in bowel or bladder habits, muscle weakness, numbness or falls associated with this pain.  HISTORY:  Prior history reviewed and updated per electronic medical record.  Social History   Occupational History  . Not on file  Tobacco Use  . Smoking status: Never Smoker  . Smokeless tobacco: Never Used  Substance and Sexual Activity  . Alcohol use: No  . Drug use: No  . Sexual activity: Never   Social History   Social History Narrative   Moved here from [place] in October 2013.   Right handed   1 cup of caffeine daily      DATA OBTAINED & REVIEWED:  Recent Labs    10/05/17 1129 01/05/18 1102  HGBA1C 6.2 6.5  CALCIUM 10.1 9.8  AST 16 17  ALT 14 17   Problem  Neurogenic Claudication Due to Lumbar Spinal Stenosis   Of note she cannot have an MRI (possibly conditional) and from a  surgical standpoint would need a multilevel instrumented fusion.  She has seen Dr. Lorin Mercy at [facility] 10-year ASCVD risk score Mikey Bussing DC Brooke Bonito., et al., 2013) is: 58.3%   Values used to calculate the score:     Age: 25 years     Sex: Female     Is Non-Hispanic African American: No     Diabetic: Yes     Tobacco smoker: No     Systolic Blood Pressure: 948 mmHg     Is BP treated: Yes     HDL Cholesterol: 52.1 mg/dL     Total Cholesterol: 191 mg/dL  OBJECTIVE:  VS:  HT:5' 2"  (157.5 cm)   WT:182 lb 3.2 oz (82.6 kg)  BMI:33.32    BP:110/64  HR:75bpm  TEMP: ( )  RESP:96 %   PHYSICAL EXAM: CONSTITUTIONAL: Well-developed, Well-nourished and In no acute distress PSYCHIATRIC: Alert & appropriately interactive. and Not depressed or anxious appearing. RESPIRATORY: No increased work of breathing and Trachea Midline EYES: Pupils are equal., EOM intact without nystagmus. and No scleral icterus.  VASCULAR EXAM: Warm and well perfused NEURO: unremarkable  MSK Exam:  Right hip, lumbar spine  Well aligned No significant deformity. No overlying skin changes TTP over: Right greater trochanter as well as over the right  gluteal musculature.  Minimal pain over the greater sciatic notch.  No focal midline pain of the lumbar spine.  Bilateral paraspinal muscle tightness in the lumbar spine.  Negative straight leg raises bilaterally although she has tightness there is no pain.     ASSESSMENT  1. Chronic right-sided low back pain with right-sided sciatica   2. Osteoarthritis of spine with radiculopathy, lumbar region   3. Greater trochanteric pain syndrome   4. Neurogenic claudication due to lumbar spinal stenosis     PLAN:  Pertinent additional documentation may be included in corresponding procedure notes, imaging studies, problem based documentation and patient instructions.  Procedures:  US Guided Injection per procedure note  Medications:  No orders of the defined types  were placed in this encounter.  Discussion/Instructions: Neurogenic claudication due to lumbar spinal stenosis Both greater trochanteric pain syndrome as well as underlying lumbar spinal stenosis contributing to her symptoms.  We will go ahead and inject her hip today and see how she does with this.  If any lack of improvement epidural steroid injection with Dr. Ernestina Patches recommended.  She will call and let us know if she needs this referral.  Discussed red flag symptoms that warrant earlier emergent evaluation and patient voices understanding. Activity modifications and the importance of avoiding exacerbating activities (limiting pain to no more than a 4 / 10 during or following activity) recommended and discussed.  Return if symptoms worsen or fail to improve.          Gerda Diss, [place] Sports Medicine Physician

## 2018-06-28 NOTE — Procedures (Signed)
PROCEDURE NOTE:  Ultrasound Guided: Injection: Right hip, Greater trochanteric Images were obtained and interpreted by myself, Teresa Coombs, DO  Images have been saved and stored to PACS system. Images obtained on: GE S7 Ultrasound machine    ULTRASOUND FINDINGS:  Slight fragmentation of the gluteal musculature insertion.  There is a small amount of overlying fluid with greater trochanteric bursa that was directly injected today.  DESCRIPTION OF PROCEDURE:  The patient's clinical condition is marked by substantial pain and/or significant functional disability. Other conservative therapy has not provided relief, is contraindicated, or not appropriate. There is a reasonable likelihood that injection will significantly improve the patient's pain and/or functional impairment.   After discussing the risks, benefits and expected outcomes of the injection and all questions were reviewed and answered, the patient wished to undergo the above named procedure.  Verbal consent was obtained.  The ultrasound was used to identify the target structure and adjacent neurovascular structures. The skin was then prepped in sterile fashion and the target structure was injected under direct visualization using sterile technique as below:  Single injection performed as below: PREP: Alcohol and Ethel Chloride APPROACH:direct, single injection, 22g 3.5 in. INJECTATE: 2 cc 0.5% Marcaine and 2 cc 76m/mL DepoMedrol ASPIRATE: None DRESSING: Band-Aid  Post procedural instructions including recommending icing and warning signs for infection were reviewed.    This procedure was well tolerated and there were no complications.   IMPRESSION: Succesful Ultrasound Guided: Injection

## 2018-06-28 NOTE — Assessment & Plan Note (Addendum)
Both greater trochanteric pain syndrome as well as underlying lumbar spinal stenosis contributing to her symptoms.  We will go ahead and inject her hip today and see how she does with this.  If any lack of improvement epidural steroid injection with Dr. Ernestina Patches recommended.  She will call and let us know if she needs this referral.

## 2018-07-05 IMAGING — CT CT CERVICAL SPINE W/O CM
2 series · 10 of 14 positions shown, 12 images · non-contrast
Comparison: None.

CLINICAL DATA: Cervicalgia. Ataxia. Neck, shoulder, and hand pain
with numbness and tingling. The symptoms are chronic and
progressive.

EXAM:
CT CERVICAL SPINE WITHOUT CONTRAST
TECHNIQUE: Multidetector CT imaging of the cervical spine was performed without
intravenous contrast. Multiplanar CT image reconstructions were also
generated.

[Series 3: cspine soft · axial · 0.31mm/px · z∈[-224,-104]mm · 5 of 91 slices shown]
[im 16/91  soft-tissue]
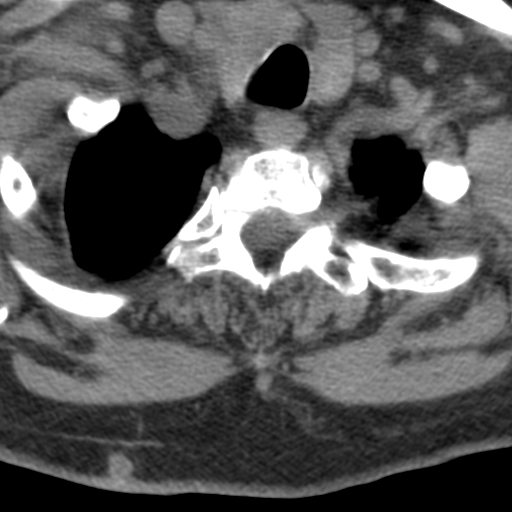
[im 31/91  soft-tissue]
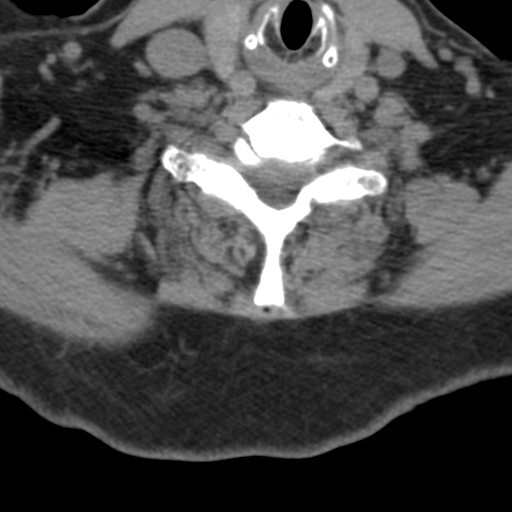
[im 46/91  soft-tissue]
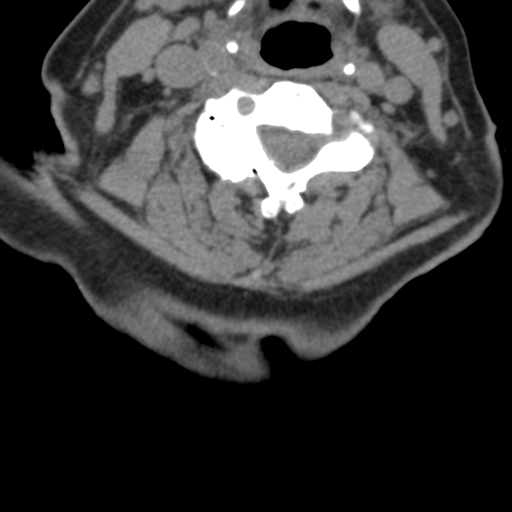
[im 61/91  soft-tissue]
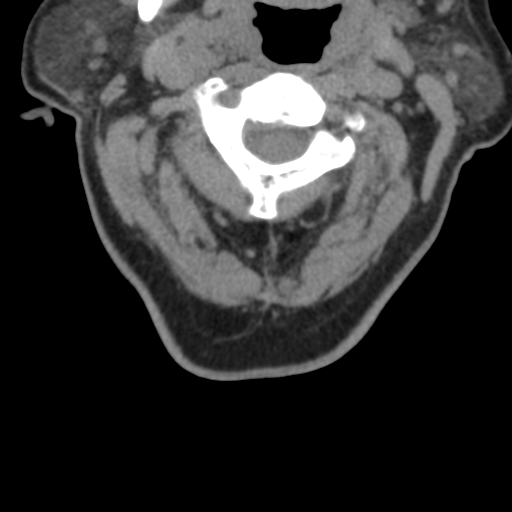
[im 76/91  soft-tissue]
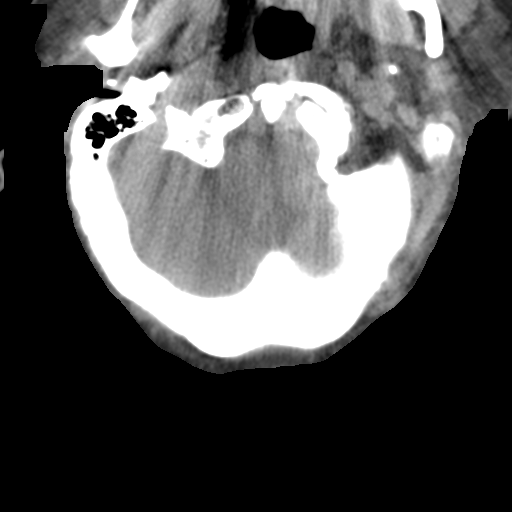

[Series 9: angled axial · axial · 0.22mm/px · z∈[-231,-111]mm · 5 of 91 slices shown, 7 images]
[im 16/91  soft-tissue]
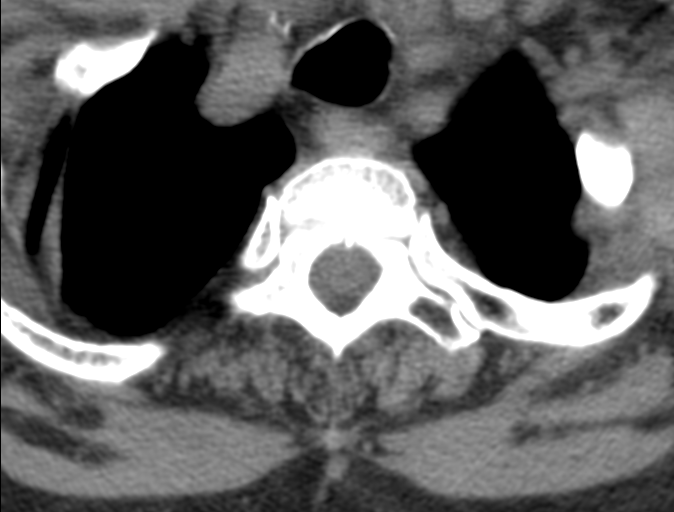
[im 16/91  bone]
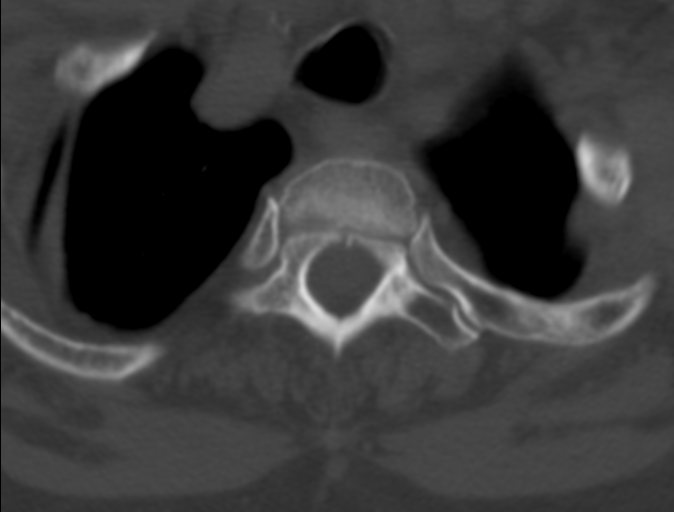
[im 31/91  bone]
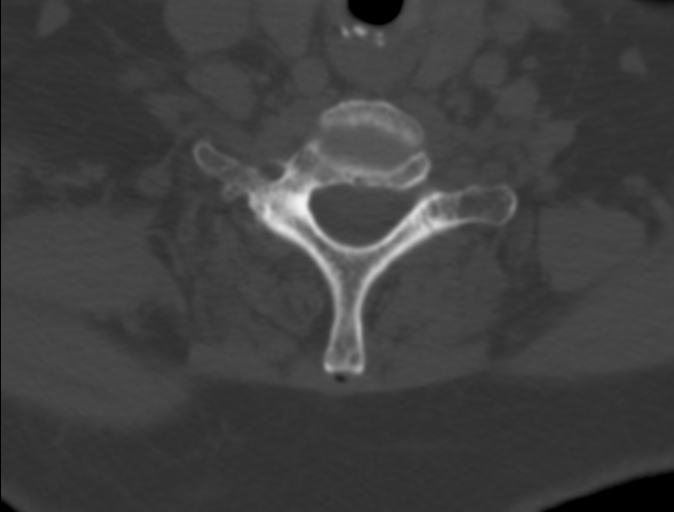
[im 46/91  bone]
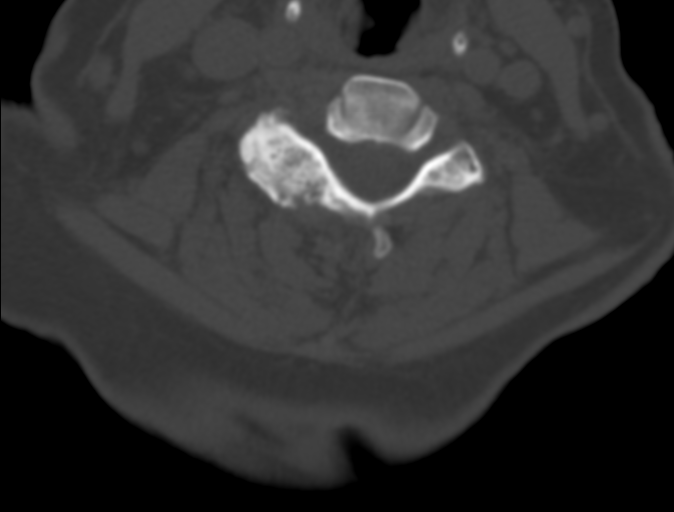
[im 61/91  bone]
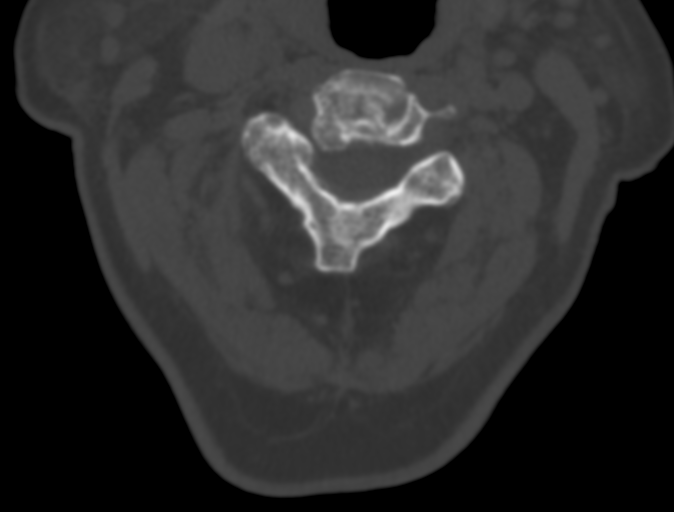
[im 76/91  soft-tissue]
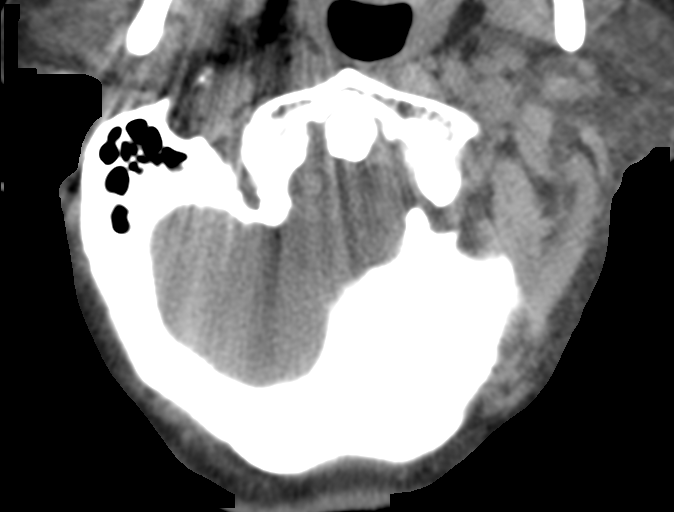
[im 76/91  bone]
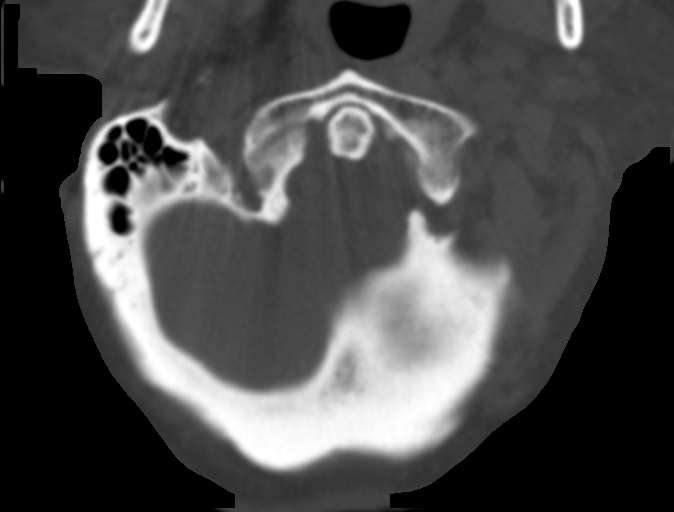

[10 of 14 positions shown; findings below may reference images not displayed]

FINDINGS: Alignment: 2 mm anterolisthesis of C6 on C7 due to bilateral facet
arthritis, right greater than left. Otherwise normal.

Skull base and vertebrae: Auto fusion of the vertebral bodies and
facet joints at C2-3. Small anterior osteophyte appears to fuse
C7-T1.

Soft tissues and spinal canal: No prevertebral fluid or swelling. No
visible canal hematoma.

Disc levels: C2-3: Auto fusion of the vertebral bodies and facet
joints. Chronic moderately severe right foraminal stenosis.

C3-4: Normal disc space. Prominent uncinate spurs create severe
right foraminal stenosis. Slight left foraminal stenosis. Severe
right and moderate left facet arthritis.

C4-5: Disc appears normal. Severe right facet arthritis without
foraminal stenosis.

C5-6: Disc space narrowing with a small broad-based disc bulge.
Small uncinate spurs slightly narrow the left neural foramen.
Moderate right facet arthritis.

C6-7: No disc bulging or protrusion. 2 mm anterolisthesis due to
moderate right and slight left facet arthritis. Widely patent neural
foramina.

C7-T1: No significant abnormality of the disc. Widely patent neural
foramina. Minimal degenerative changes of the facet joints.

T1-2:  Moderate bilateral facet arthritis.  No neural impingement.

There is no significant cervical spinal stenosis.

Upper chest: Aortic atherosclerosis.  Otherwise, negative.

Other: None
IMPRESSION: 1. Multilevel degenerative disc disease and facet arthritis.
2. Facet arthritis is most severe at C3-4 through C5-6 on the right.
3. No cervical spinal stenosis.
4. Auto fusion at C2-3.
5. Severe right foraminal stenosis at C3-4 and moderately severe
right foraminal stenosis at C2-3.

## 2018-07-08 LAB — CUP PACEART REMOTE DEVICE CHECK
Battery Remaining Longevity: 71 mo
Battery Voltage: 3 V
Brady Statistic AP VP Percent: 0.03 %
Brady Statistic AP VS Percent: 95.53 %
Brady Statistic AS VP Percent: 0 %
Brady Statistic AS VS Percent: 4.44 %
Brady Statistic RA Percent Paced: 95.27 %
Brady Statistic RV Percent Paced: 0.03 %
Date Time Interrogation Session: 20191104200322
Implantable Lead Implant Date: 20150729
Implantable Lead Implant Date: 20150729
Implantable Lead Location: 753859
Implantable Lead Location: 753860
Implantable Lead Model: 5076
Implantable Lead Model: 5076
Implantable Pulse Generator Implant Date: 20150729
Lead Channel Impedance Value: 380 Ohm
Lead Channel Impedance Value: 475 Ohm
Lead Channel Impedance Value: 494 Ohm
Lead Channel Impedance Value: 551 Ohm
Lead Channel Pacing Threshold Amplitude: 0.75 V
Lead Channel Pacing Threshold Amplitude: 0.875 V
Lead Channel Pacing Threshold Pulse Width: 0.4 ms
Lead Channel Pacing Threshold Pulse Width: 0.4 ms
Lead Channel Sensing Intrinsic Amplitude: 1.75 mV
Lead Channel Sensing Intrinsic Amplitude: 1.75 mV
Lead Channel Sensing Intrinsic Amplitude: 11.875 mV
Lead Channel Sensing Intrinsic Amplitude: 11.875 mV
Lead Channel Setting Pacing Amplitude: 1.75 V
Lead Channel Setting Pacing Amplitude: 2 V
Lead Channel Setting Pacing Pulse Width: 0.4 ms
Lead Channel Setting Sensing Sensitivity: 2 mV

## 2018-07-21 ENCOUNTER — Other Ambulatory Visit: Payer: Self-pay | Admitting: Family Medicine

## 2018-08-22 ENCOUNTER — Ambulatory Visit: Payer: Medicare Other

## 2018-08-23 ENCOUNTER — Ambulatory Visit (INDEPENDENT_AMBULATORY_CARE_PROVIDER_SITE_OTHER): Payer: Medicare Other

## 2018-08-23 DIAGNOSIS — I5032 Chronic diastolic (congestive) heart failure: Secondary | ICD-10-CM | POA: Diagnosis not present

## 2018-08-23 DIAGNOSIS — I495 Sick sinus syndrome: Secondary | ICD-10-CM

## 2018-08-26 LAB — CUP PACEART REMOTE DEVICE CHECK
Battery Remaining Longevity: 69 mo
Battery Voltage: 3 V
Brady Statistic AP VP Percent: 0.03 %
Brady Statistic AP VS Percent: 96.4 %
Brady Statistic AS VP Percent: 0 %
Brady Statistic AS VS Percent: 3.57 %
Brady Statistic RA Percent Paced: 96.1 %
Brady Statistic RV Percent Paced: 0.03 %
Date Time Interrogation Session: 20200204185031
Implantable Lead Implant Date: 20150729
Implantable Lead Implant Date: 20150729
Implantable Lead Location: 753859
Implantable Lead Location: 753860
Implantable Lead Model: 5076
Implantable Lead Model: 5076
Implantable Pulse Generator Implant Date: 20150729
Lead Channel Impedance Value: 361 Ohm
Lead Channel Impedance Value: 475 Ohm
Lead Channel Impedance Value: 475 Ohm
Lead Channel Impedance Value: 513 Ohm
Lead Channel Pacing Threshold Amplitude: 0.75 V
Lead Channel Pacing Threshold Amplitude: 1 V
Lead Channel Pacing Threshold Pulse Width: 0.4 ms
Lead Channel Pacing Threshold Pulse Width: 0.4 ms
Lead Channel Sensing Intrinsic Amplitude: 1.75 mV
Lead Channel Sensing Intrinsic Amplitude: 1.75 mV
Lead Channel Sensing Intrinsic Amplitude: 10.625 mV
Lead Channel Sensing Intrinsic Amplitude: 10.625 mV
Lead Channel Setting Pacing Amplitude: 2 V
Lead Channel Setting Pacing Amplitude: 2 V
Lead Channel Setting Pacing Pulse Width: 0.4 ms
Lead Channel Setting Sensing Sensitivity: 2 mV

## 2018-08-29 ENCOUNTER — Other Ambulatory Visit: Payer: Self-pay

## 2018-08-29 ENCOUNTER — Telehealth: Payer: Self-pay | Admitting: Family Medicine

## 2018-08-29 DIAGNOSIS — M48062 Spinal stenosis, lumbar region with neurogenic claudication: Secondary | ICD-10-CM

## 2018-08-29 MED ORDER — GABAPENTIN 100 MG PO CAPS: 200.0000 mg | ORAL_CAPSULE | Freq: Two times a day (BID) | ORAL | 2 refills | Status: DC

## 2018-08-29 NOTE — Telephone Encounter (Signed)
MEDICATION:  gabapentin (NEURONTIN) 100 MG capsule   PHARMACY:   [facility], [place] [address] [phone] (Phone) [phone] (Fax)    IS THIS A 90 DAY SUPPLY : Y  IS PATIENT OUT OF MEDICATION: Y  IF NOT; HOW MUCH IS LEFT:   LAST APPOINTMENT DATE: @1 /08/2018  NEXT APPOINTMENT DATE:@4 /30/2020  OTHER COMMENTS:    **Let patient know to contact pharmacy at the end of the day to make sure medication is ready. **  ** Please notify patient to allow 48-72 hours to process**  **Encourage patient to contact the pharmacy for refills or they can request refills through [facility]**

## 2018-08-31 ENCOUNTER — Other Ambulatory Visit: Payer: Self-pay

## 2018-08-31 ENCOUNTER — Telehealth: Payer: Self-pay | Admitting: Family Medicine

## 2018-08-31 DIAGNOSIS — M48062 Spinal stenosis, lumbar region with neurogenic claudication: Secondary | ICD-10-CM

## 2018-08-31 MED ORDER — GABAPENTIN 100 MG PO CAPS: 200.0000 mg | ORAL_CAPSULE | Freq: Two times a day (BID) | ORAL | 2 refills | Status: DC

## 2018-08-31 NOTE — Telephone Encounter (Signed)
Pts daughter stated pharmacy said they never received Rx for gabapentin (NEURONTIN) 100 MG capsule. Rx needs to go to  [facility], [address] [phone] (Phone) [phone] (Fax)   Please advise.

## 2018-08-31 NOTE — Telephone Encounter (Signed)
Rx sent to pharmacy at 11:18am.  Pt aware of Rx has been sent.

## 2018-09-01 ENCOUNTER — Ambulatory Visit (INDEPENDENT_AMBULATORY_CARE_PROVIDER_SITE_OTHER): Payer: Medicare Other | Admitting: Family Medicine

## 2018-09-01 ENCOUNTER — Encounter: Payer: Self-pay | Admitting: Family Medicine

## 2018-09-01 ENCOUNTER — Ambulatory Visit (INDEPENDENT_AMBULATORY_CARE_PROVIDER_SITE_OTHER): Payer: Medicare Other

## 2018-09-01 VITALS — BP 110/60 | HR 86 | Temp 98.9°F | Ht 62.0 in | Wt 183.2 lb

## 2018-09-01 DIAGNOSIS — R05 Cough: Secondary | ICD-10-CM

## 2018-09-01 DIAGNOSIS — R059 Cough, unspecified: Secondary | ICD-10-CM

## 2018-09-01 MED ORDER — IPRATROPIUM BROMIDE 0.06 % NA SOLN: 2.0000 | Freq: Four times a day (QID) | NASAL | 0 refills | Status: DC

## 2018-09-01 MED ORDER — DOXYCYCLINE HYCLATE 100 MG PO TABS: 100.0000 mg | ORAL_TABLET | Freq: Two times a day (BID) | ORAL | 0 refills | Status: DC

## 2018-09-01 MED ORDER — BENZONATATE 200 MG PO CAPS: 200.0000 mg | ORAL_CAPSULE | Freq: Two times a day (BID) | ORAL | 0 refills | Status: DC | PRN

## 2018-09-01 NOTE — Patient Instructions (Signed)
Start the atrovent.  Start tessalon for your cough.  Start the doxycycline if your symptoms worsen or do not improve in a few days.  Please stay well hydrated.  You can take tylenol and/or motrin as needed for low grade fever and pain.  Please let me know if your symptoms worsen or fail to improve.  Take care, Dr Jerline Pain

## 2018-09-01 NOTE — Progress Notes (Signed)
   Chief Complaint:  Heather [name] is a 78 y.o. female who presents for same day appointment with a chief complaint of cough.   Assessment/Plan:  Cough Chest x-ray without any obvious infiltrates based on my read, will await radiology read.  She does have some evidence for rhinorrhea/sinusitis which could be contributing to her cough.  Will start treatment with Atrovent nasal spray, course of doxycycline 100 mg twice daily x7 days, and Tessalon as needed for cough.  Encouraged good oral hydration.  Discussed reasons to return to care and seek emergent care.  Follow-up as needed.     Subjective:  HPI:  Cough, acute problem Started 6 months ago.  It has been intermittent in nature however has worsened over the last couple of weeks.  Associated with chest congestion.  Symptoms seem to be worse at night.  No sputum production.  No rhinorrhea.  No fevers or chills.  She has tried honey and Mucinex with no improvement.  No other obvious alleviating or aggravating factors.   ROS: Per HPI  PMH: She reports that she has never smoked. She has never used smokeless tobacco. She reports that she does not drink alcohol or use drugs.      Objective:  Physical Exam: BP 110/60 (BP Location: Left Arm, Patient Position: Sitting, Cuff Size: Normal)   Pulse 86   Temp 98.9 F (37.2 C) (Oral)   Ht 5' 2"  (1.575 m)   Wt 183 lb 3.2 oz (83.1 kg)   LMP  (LMP Unknown)   SpO2 95%   BMI 33.51 kg/m   Gen: NAD, resting comfortably HEENT: TMs clear.  OP erythematous.  Nasal mucosa erythematous with clear discharge. CV: Regular rate and rhythm with no murmurs appreciated Pulm: Normal work of breathing, clear to auscultation bilaterally with no crackles, wheezes, or rhonchi     Caleb M. Jerline Pain, MD 09/01/2018 4:16 PM

## 2018-09-01 NOTE — Progress Notes (Signed)
Remote pacemaker transmission.   

## 2018-09-21 ENCOUNTER — Other Ambulatory Visit: Payer: Self-pay

## 2018-09-21 DIAGNOSIS — I10 Essential (primary) hypertension: Principal | ICD-10-CM

## 2018-09-21 DIAGNOSIS — E1159 Type 2 diabetes mellitus with other circulatory complications: Secondary | ICD-10-CM

## 2018-09-21 MED ORDER — LOSARTAN POTASSIUM 50 MG PO TABS: 50.0000 mg | ORAL_TABLET | Freq: Every day | ORAL | 2 refills | Status: DC

## 2018-09-21 MED ORDER — OXYBUTYNIN CHLORIDE ER 10 MG PO TB24: 10.0000 mg | ORAL_TABLET | Freq: Every day | ORAL | 1 refills | Status: DC

## 2018-09-23 ENCOUNTER — Encounter: Payer: Self-pay | Admitting: Sports Medicine

## 2018-09-23 ENCOUNTER — Ambulatory Visit (INDEPENDENT_AMBULATORY_CARE_PROVIDER_SITE_OTHER): Payer: Medicare Other | Admitting: Sports Medicine

## 2018-09-23 ENCOUNTER — Ambulatory Visit: Payer: Self-pay

## 2018-09-23 VITALS — BP 120/64 | HR 61 | Ht 62.0 in | Wt 184.2 lb

## 2018-09-23 DIAGNOSIS — M25511 Pain in right shoulder: Secondary | ICD-10-CM | POA: Diagnosis not present

## 2018-09-23 DIAGNOSIS — M19011 Primary osteoarthritis, right shoulder: Secondary | ICD-10-CM

## 2018-09-23 DIAGNOSIS — M48062 Spinal stenosis, lumbar region with neurogenic claudication: Secondary | ICD-10-CM | POA: Diagnosis not present

## 2018-09-23 DIAGNOSIS — M4722 Other spondylosis with radiculopathy, cervical region: Secondary | ICD-10-CM | POA: Diagnosis not present

## 2018-09-23 DIAGNOSIS — M25559 Pain in unspecified hip: Secondary | ICD-10-CM | POA: Diagnosis not present

## 2018-09-23 DIAGNOSIS — G8929 Other chronic pain: Secondary | ICD-10-CM

## 2018-09-23 DIAGNOSIS — M5441 Lumbago with sciatica, right side: Secondary | ICD-10-CM | POA: Diagnosis not present

## 2018-09-23 NOTE — Progress Notes (Signed)
Heather [name]. , [place] [facility] [place]  Heather [name] - 78 y.o. female MRN [id number]  Date of birth: 19-Nov-1940  Visit Date: September 23, 2018  PCP: Briscoe Deutscher, DO   Referred by: Briscoe Deutscher, DO  SUBJECTIVE:  Chief Complaint  Patient presents with  . Right Shoulder - Follow-up    XR C-spine and R shoulder 05/17/18. Corticosteroid injection 05/17/18.   . Right Hip - Follow-up  . Lower Back - Follow-up    HPI: Patient presents with the above symptoms.  Her right hip is actually continue to give her pain radiating down the posterior aspect of her leg.  She does report having an epidural steroid injection with Dr. Ernestina Patches previously and this provided the most benefit of all.  The greater trochanteric injection was only minimally helpful.  Her bilateral shoulders right worse than left are also bothering her.  The prior injection into the right shoulder joint actually provided relief for both and she is requesting this repeated again today.  She does wake up in the mornings with some numbness and tingling in her hands but this is mild.  No significant weakness  REVIEW OF SYSTEMS: Per HPI  HISTORY:  Prior history reviewed and updated per electronic medical record.  Patient Active Problem List   Diagnosis Date Noted  . Primary osteoarthritis of right shoulder 05/17/2018  . CKD stage 3 due to type 2 diabetes mellitus ([place]) 10/07/2017  . Sinus node dysfunction ([place]) 10/07/2017  . Mild cognitive impairment 09/29/2017  . Osteoarthritis of cervical spine 06/04/2017    05/17/2018 XR C-spine IMPRESSION: 1. Curvature cervical spine convex left. 2. Fusion C2-3. 3. C5-6 mild to moderate disc space narrowing. Minimal anterior slip C5. 4. C6-7 minimal to mild disc space narrowing. Minimal anterior slip C6. 5. Facet degenerative changes and uncinate hypertrophy resulting in: Moderate right-sided C2-3 foraminal narrowing.  Moderate to marked right-sided and mild to moderate left-sided C3-4 foraminal narrowing. Minimal right-sided C4-5 foraminal narrowing. Mild to moderate left-sided C5-6 foraminal narrowing.    . Imbalance 05/30/2017  . Controlled type 2 diabetes mellitus with chronic kidney disease, without long-term current use of insulin ([place]) 04/27/2017  . Scoliosis of lumbar spine 06/02/2016    degenerative disc disease lithesis   . Complete tear of right rotator cuff 06/02/2016  . Neurogenic claudication due to lumbar spinal stenosis 05/06/2016    Of note she cannot have an MRI (possibly conditional) and from a surgical standpoint would need a multilevel instrumented fusion.  She has seen Dr. Lorin Mercy at [facility]   . Right shoulder pain 05/06/2016    05/17/2018 XR R shoulder IMPRESSION: 1. Marked acromioclavicular joint degenerative changes with bony overgrowth. 2. Mild to moderate glenohumeral joint degenerative changes.   . Sjogren's syndrome ([place]) 04/27/2016  . Xerostomia 04/27/2016  . Postmenopausal atrophic vaginitis 09/16/2015  . CKD (chronic kidney disease) stage 4, GFR 15-29 ml/min (H[facility]) 05/06/2015  . Bilateral hip pain 05/06/2015  . Seborrheic keratoses 05/06/2015  . 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 Crohn's disease ([place]) 02/16/2014  . OSA (obstructive sleep apnea) 01/29/2014  . Pulmonary hypertension ([place]) 01/29/2014  . CAD (coronary artery disease) 01/29/2014  . Hypertension associated with diabetes ([place]) 01/29/2014  . Diastolic CHF ([place]) 49/70/2637   Social History   Occupational History  . Not on file  Tobacco Use  . Smoking status: Never Smoker  . Smokeless tobacco: Never Used  Substance and Sexual Activity  . Alcohol use: No  .  Drug use: No  . Sexual activity: Never   Social History   Social History Narrative   Moved here from [place] in October 2013.   Right handed   1 cup of caffeine daily    OBJECTIVE:  VS:  HT:5' 2"  (157.5 cm)   WT:184 lb 3.2  oz (83.6 kg)  BMI:33.68    BP:120/64  HR:61bpm  TEMP: ( )  RESP:95 %   PHYSICAL EXAM: Adult female. No acute distress.  Alert and appropriate. Interpreter present is her daughter.  Bilateral lower extremities overall well aligned.  She has a positive straight leg raise on the right.  She is based antalgic gait.  Marked pain with overhead range of motion of the right shoulder and left.  Positive Hawkins and Neer's.  Intrinsic rotator cuff strength however does appear to be intact with internal rotation external rotation.   ASSESSMENT:  1. Neurogenic claudication due to lumbar spinal stenosis   2. Chronic right shoulder pain   3. Osteoarthritis of spine with radiculopathy, cervical region   4. Chronic right-sided low back pain with right-sided sciatica   5. Greater trochanteric pain syndrome   6. Primary osteoarthritis of right shoulder     PROCEDURES:  US Guided Injection per procedure note       PLAN:  Pertinent additional documentation may be included in corresponding procedure notes, imaging studies, problem based documentation and patient instructions.  Patient will benefit from a referral to Dr. Ernestina Patches for epidural steroid injection at L5-S1.  She does have a positive neural tension sign on the right.  Unfortunately she is unable to get MRIs due to her underlying medical issues.  I will see how she responds to the right intra-articular injection today and we will plan to follow-up with her in 10 weeks.  If any lack of improvement can consider gabapentinoids however given age and comorbidities I like to try to avoid these if possible.  Activity modifications and the importance of avoiding exacerbating activities (limiting pain to no more than a 4 / 10 during or following activity) recommended and discussed.   Discussed red flag symptoms that warrant earlier emergent evaluation and patient voices understanding.   No orders of the defined types were placed in this  encounter.  Lab Orders  No laboratory test(s) ordered today    Imaging Orders     [place] [facility] POCT ULTRASOUND Referral Orders     Ambulatory referral to Physical Medicine Rehab  At follow up will plan to consider : repeat corticosteroid injections  Return in about 10 weeks (around 12/02/2018) for consideration of repeat injections.          Gerda Diss, [place] Sports Medicine Physician

## 2018-09-23 NOTE — Procedures (Signed)
PROCEDURE NOTE:  Ultrasound Guided: Injection: Right shoulder, Intra-articular Images were obtained and interpreted by myself, Teresa Coombs, DO  Images have been saved and stored to PACS system. Images obtained on: GE S7 Ultrasound machine    ULTRASOUND FINDINGS:  Moderate degenerative change.  Small effusion.  DESCRIPTION OF PROCEDURE:  The patient's clinical condition is marked by substantial pain and/or significant functional disability. Other conservative therapy has not provided relief, is contraindicated, or not appropriate. There is a reasonable likelihood that injection will significantly improve the patient's pain and/or functional impairment.   After discussing the risks, benefits and expected outcomes of the injection and all questions were reviewed and answered, the patient wished to undergo the above named procedure.  Verbal consent was obtained.  The ultrasound was used to identify the target structure and adjacent neurovascular structures. The skin was then prepped in sterile fashion and the target structure was injected under direct visualization using sterile technique as below:  Single injection performed as below: PREP: Alcohol and Ethel Chloride APPROACH:posterior, single injection, 25g 1.5 in. INJECTATE: 2 cc 0.5% Marcaine and 2 cc 44m/mL DepoMedrol ASPIRATE: None DRESSING: Band-Aid  Post procedural instructions including recommending icing and warning signs for infection were reviewed.    This procedure was well tolerated and there were no complications.   IMPRESSION: Succesful Ultrasound Guided: Injection

## 2018-09-30 ENCOUNTER — Ambulatory Visit (INDEPENDENT_AMBULATORY_CARE_PROVIDER_SITE_OTHER): Payer: Medicare Other | Admitting: Family Medicine

## 2018-09-30 ENCOUNTER — Ambulatory Visit: Payer: Medicare Other | Admitting: Physician Assistant

## 2018-09-30 VITALS — BP 126/82 | HR 81 | Temp 98.6°F

## 2018-09-30 DIAGNOSIS — R05 Cough: Secondary | ICD-10-CM | POA: Diagnosis not present

## 2018-09-30 DIAGNOSIS — R059 Cough, unspecified: Secondary | ICD-10-CM

## 2018-09-30 MED ORDER — AZITHROMYCIN 250 MG PO TABS: ORAL_TABLET | ORAL | 0 refills | Status: DC

## 2018-09-30 MED ORDER — MONTELUKAST SODIUM 10 MG PO TABS: 10.0000 mg | ORAL_TABLET | Freq: Every day | ORAL | 3 refills | Status: AC

## 2018-09-30 MED ORDER — BUDESONIDE-FORMOTEROL FUMARATE 160-4.5 MCG/ACT IN AERO: 2.0000 | INHALATION_SPRAY | Freq: Two times a day (BID) | RESPIRATORY_TRACT | 3 refills | Status: DC

## 2018-09-30 MED ORDER — AMLODIPINE BESYLATE 5 MG PO TABS: 5.0000 mg | ORAL_TABLET | Freq: Every day | ORAL | 2 refills | Status: DC

## 2018-09-30 NOTE — Progress Notes (Signed)
Subjective:     Heather [name] is a 78 y.o. female who presents for evaluation of chills, myalgias, nasal congestion, productive cough with sputum described as green and wheezing. Symptoms began 1 week ago. Symptoms have been gradually worsening since that time. Past history is significant for asthma and COPD.  The following portions of the patient's history were reviewed and updated as appropriate: allergies, current medications, past family history, past medical history, past social history, past surgical history and problem list.  Review of Systems Pertinent items are noted in HPI.    Objective:   General: Cooperative, alert and oriented, well developed, well nourished, in no acute distress. HEENT: Pupils equal round reactive light and extraocular movements intact. Conjunctivae and lids unremarkable, funduscopic exam and visual fields not performed. No pallor or cyanosis, dentition good. Neck: No thyromegaly. No JVD. No carotid bruits.  Cardiovascular: Regular rhythm. No murmurs appreciated. Lungs: + rhonchi and wheeze. No tenderness to palpation, normal respiratory excursion, no use of accessory muscles. Abdomen: Soft, nontender, no masses or hepatosplenomegaly. Extremities: No clubbing, cyanosis, erythema. No edema. Normal muscle strength and tone. Pulses: 2+ radial, 2+ femoral pulses, 2+ pedal pulses. Skin: Reveals no rashes.    Assessment:   Zaray was seen today for cough.  Diagnoses and all orders for this visit:  Cough -     azithromycin (ZITHROMAX) 250 MG tablet; 2 po on day one, then one po each day until gone. -     budesonide-formoterol (SYMBICORT) 160-4.5 MCG/ACT inhaler; Inhale 2 puffs into the lungs 2 (two) times daily. -     amLODipine (NORVASC) 5 MG tablet; Take 1 tablet (5 mg total) by mouth daily. -     montelukast (SINGULAIR) 10 MG tablet; Take 1 tablet (10 mg total) by mouth at bedtime.   Briscoe Deutscher, DO

## 2018-10-01 ENCOUNTER — Encounter: Payer: Self-pay | Admitting: Family Medicine

## 2018-10-07 ENCOUNTER — Other Ambulatory Visit: Payer: Self-pay

## 2018-10-07 DIAGNOSIS — G8929 Other chronic pain: Secondary | ICD-10-CM

## 2018-10-07 DIAGNOSIS — M5441 Lumbago with sciatica, right side: Principal | ICD-10-CM

## 2018-11-09 ENCOUNTER — Other Ambulatory Visit: Payer: Self-pay

## 2018-11-09 MED ORDER — OXYBUTYNIN CHLORIDE ER 10 MG PO TB24: 10.0000 mg | ORAL_TABLET | Freq: Every day | ORAL | 0 refills | Status: DC

## 2018-11-17 ENCOUNTER — Ambulatory Visit: Payer: Medicare Other | Admitting: Sports Medicine

## 2018-11-24 ENCOUNTER — Telehealth: Payer: Self-pay

## 2018-11-24 NOTE — Telephone Encounter (Signed)
I tried to call patients daughter about upcoming appointment on 11/29/18, we need to get this switched over to a telehealth visit. Call was dropped.

## 2018-11-25 NOTE — Telephone Encounter (Signed)
I called and spoke with patient, she can do a telehealth visit on 11/29/18.     Virtual Visit Pre-Appointment Phone Call  "(Name), I am calling you today to discuss your upcoming appointment. We are currently trying to limit exposure to the virus that causes COVID-19 by seeing patients at home rather than in the office."  1. "What is the BEST phone number to call the day of the visit?" - include this in appointment notes  2. "Do you have or have access to (through a family member/friend) a smartphone with video capability that we can use for your visit?" a. If yes - list this number in appt notes as "cell" (if different from BEST phone #) and list the appointment type as a VIDEO visit in appointment notes b. If no - list the appointment type as a PHONE visit in appointment notes  3. Confirm consent - "In the setting of the current Covid19 crisis, you are scheduled for a (phone or video) visit with your provider on (date) at (time).  Just as we do with many in-office visits, in order for you to participate in this visit, we must obtain consent.  If you'd like, I can send this to your mychart (if signed up) or email for you to review.  Otherwise, I can obtain your verbal consent now.  All virtual visits are billed to your insurance company just like a normal visit would be.  By agreeing to a virtual visit, we'd like you to understand that the technology does not allow for your provider to perform an examination, and thus may limit your provider's ability to fully assess your condition. If your provider identifies any concerns that need to be evaluated in person, we will make arrangements to do so.  Finally, though the technology is pretty good, we cannot assure that it will always work on either your or our end, and in the setting of a video visit, we may have to convert it to a phone-only visit.  In either situation, we cannot ensure that we have a secure connection.  Are you willing to proceed?" STAFF:  Did the patient verbally acknowledge consent to telehealth visit? Document YES/NO here: YES  4. Advise patient to be prepared - "Two hours prior to your appointment, go ahead and check your blood pressure, pulse, oxygen saturation, and your weight (if you have the equipment to check those) and write them all down. When your visit starts, your provider will ask you for this information. If you have an Apple Watch or Kardia device, please plan to have heart rate information ready on the day of your appointment. Please have a pen and paper handy nearby the day of the visit as well."  5. Give patient instructions for MyChart download to smartphone OR Doximity/Doxy.me as below if video visit (depending on what platform provider is using)  6. Inform patient they will receive a phone call 15 minutes prior to their appointment time (may be from unknown caller ID) so they should be prepared to answer    Heather [name] has been deemed a candidate for a follow-up tele-health visit to limit community exposure during the Covid-19 pandemic. I spoke with the patient via phone to ensure availability of phone/video source, confirm preferred email & phone number, and discuss instructions and expectations.  I reminded Heather [name] to be prepared with any vital sign and/or heart rhythm information that could potentially be obtained via home monitoring,  at the time of her visit. I reminded Heather [name] to expect a phone call prior to her visit.  Mady Haagensen, [place] 11/25/2018 2:27 PM   INSTRUCTIONS FOR DOWNLOADING THE MYCHART APP TO SMARTPHONE  - The patient must first make sure to have activated MyChart and know their login information - If Apple, go to [employer] and type in MyChart in the search bar and download the app. If Android, ask patient to go to [employer] and type in [place] in the search bar and download the app. The app is free but as with any  other app downloads, their phone may require them to verify saved payment information or Apple/Android password.  - The patient will need to then log into the app with their MyChart username and password, and select [facility] as their healthcare provider to link the account. When it is time for your visit, go to the MyChart app, find appointments, and click Begin Video Visit. Be sure to Select Allow for your device to access the Microphone and Camera for your visit. You will then be connected, and your provider will be with you shortly.  **If they have any issues connecting, or need assistance please contact MyChart service desk [phone])**  **If using a computer, in order to ensure the best quality for their visit they will need to use either of the following Internet Browsers: [employer], or Google Chrome**  IF USING DOXIMITY or DOXY.ME - The patient will receive a link just prior to their visit by text.     FULL LENGTH CONSENT FOR TELE-HEALTH VISIT   I hereby voluntarily request, consent and authorize [facility] and its employed or contracted physicians, physician assistants, nurse practitioners or other licensed health care professionals (the Practitioner), to provide me with telemedicine health care services (the "Services") as deemed necessary by the treating Practitioner. I acknowledge and consent to receive the Services by the Practitioner via telemedicine. I understand that the telemedicine visit will involve communicating with the Practitioner through live audiovisual communication technology and the disclosure of certain medical information by electronic transmission. I acknowledge that I have been given the opportunity to request an in-person assessment or other available alternative prior to the telemedicine visit and am voluntarily participating in the telemedicine visit.  I understand that I have the right to withhold or withdraw my consent to the use of  telemedicine in the course of my care at any time, without affecting my right to future care or treatment, and that the Practitioner or I may terminate the telemedicine visit at any time. I understand that I have the right to inspect all information obtained and/or recorded in the course of the telemedicine visit and may receive copies of available information for a reasonable fee.  I understand that some of the potential risks of receiving the Services via telemedicine include:  Marland Kitchen Delay or interruption in medical evaluation due to technological equipment failure or disruption; . Information transmitted may not be sufficient (e.g. poor resolution of images) to allow for appropriate medical decision making by the Practitioner; and/or  . In rare instances, security protocols could fail, causing a breach of personal health information.  Furthermore, I acknowledge that it is my responsibility to provide information about my medical history, conditions and care that is complete and accurate to the best of my ability. I acknowledge that Practitioner's advice, recommendations, and/or decision may be based on factors not within their control, such as incomplete  or inaccurate data provided by me or distortions of diagnostic images or specimens that may result from electronic transmissions. I understand that the practice of medicine is not an exact science and that Practitioner makes no warranties or guarantees regarding treatment outcomes. I acknowledge that I will receive a copy of this consent concurrently upon execution via email to the email address I last provided but may also request a printed copy by calling the office of [facility].    I understand that my insurance will be billed for this visit.   I have read or had this consent read to me. . I understand the contents of this consent, which adequately explains the benefits and risks of the Services being provided via telemedicine.  . I have been  provided ample opportunity to ask questions regarding this consent and the Services and have had my questions answered to my satisfaction. . I give my informed consent for the services to be provided through the use of telemedicine in my medical care  By participating in this telemedicine visit I agree to the above.

## 2018-11-28 ENCOUNTER — Ambulatory Visit: Payer: Medicare Other | Admitting: Sports Medicine

## 2018-11-29 ENCOUNTER — Ambulatory Visit (INDEPENDENT_AMBULATORY_CARE_PROVIDER_SITE_OTHER): Payer: Medicare Other | Admitting: *Deleted

## 2018-11-29 ENCOUNTER — Encounter: Payer: Self-pay | Admitting: Internal Medicine

## 2018-11-29 ENCOUNTER — Other Ambulatory Visit: Payer: Self-pay

## 2018-11-29 ENCOUNTER — Telehealth (INDEPENDENT_AMBULATORY_CARE_PROVIDER_SITE_OTHER): Payer: Medicare Other | Admitting: Internal Medicine

## 2018-11-29 VITALS — BP 140/74 | HR 90 | Ht 62.0 in | Wt 176.0 lb

## 2018-11-29 DIAGNOSIS — I495 Sick sinus syndrome: Secondary | ICD-10-CM | POA: Diagnosis not present

## 2018-11-29 DIAGNOSIS — I5032 Chronic diastolic (congestive) heart failure: Secondary | ICD-10-CM | POA: Diagnosis not present

## 2018-11-29 DIAGNOSIS — Z950 Presence of cardiac pacemaker: Secondary | ICD-10-CM

## 2018-11-29 NOTE — Progress Notes (Signed)
Electrophysiology TeleHealth Note   Due to national recommendations of social distancing due to COVID 19, an audio/video telehealth visit is felt to be most appropriate for this patient at this time.  See MyChart message from today for the patient's consent to telehealth for [facility].   Date:  11/29/2018   ID:  [place], [name] 03-Jan-1941, MRN [id number]  Location: patient's home  Provider location: [address], [place]  Evaluation Performed: Follow-up visit  PCP:  Briscoe Deutscher, DO  Cardiologist:     Electrophysiologist:  SK   Chief Complaint:  Sinus node dysfunction and pacemaker  History of Present Illness:    Heather [name] is a 78 y.o. female who presents via audio/video conferencing for a telehealth visit today.  Since last being seen in our clinic, the patient reports no complaints  The patient denies chest pain, shortness of breath, nocturnal dyspnea, orthopnea or peripheral edema.  There have been no palpitations, lightheadedness or syncope.  I am communicating with the patient through her daughter translating  To see renal in 2  m  No hx of smoking; CXR revieweed 2/20  No COPD   The patient denies symptoms of fevers, chills, cough, or new SOB worrisome for COVID 19.    Past Medical History:  Diagnosis Date  . CHF (congestive heart failure) ([place])   . Chronic back pain   . Crohn's disease ([place])   . GERD (gastroesophageal reflux disease)   . High cholesterol   . History of diabetes mellitus, type II   . History of echocardiogram    Echo 12/16: EF 60-65%, no RWMA, Gr 2 DD, mild AS (mean 13 mmHg), mild AI, MAC, trivial MS, mild MR, mild LAE, mild RVE, mild to mod TR, PASP 36 mmHg  . Hypertension   . Migraine    "1-2 times/yr" (02/16/2014)  . Murmur, cardiac   . OSA on CPAP   . 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 Pulmonary hypertension ([place])   . Sinus bradycardia 01/29/2014  . Skin cancer 2003   "3 cut off face"  (02/16/2014)    Past Surgical History:  Procedure Laterality Date  . CARDIAC CATHETERIZATION  1995; 2005  . CATARACT EXTRACTION Bilateral 1982  . ESOPHAGOGASTRODUODENOSCOPY (EGD) WITH PROPOFOL N/A 12/28/2016   Procedure: ESOPHAGOGASTRODUODENOSCOPY (EGD) WITH PROPOFOL;  Surgeon: Otis Brace, MD;  Location: [facility];  Service: Gastroenterology;  Laterality: N/A;  . INSERT / REPLACE / REMOVE PACEMAKER  02/14/2014  . PERMANENT PACEMAKER INSERTION N/A 02/14/2014   Procedure: PERMANENT PACEMAKER INSERTION;  Surgeon: Deboraha Sprang, MD;  Location: [facility] C[facility] LAB;  Service: Cardiovascular;  Laterality: N/A;  . SKIN CANCER EXCISION  2003    "face X 3" (02/16/2014)  . TEMPORARY PACEMAKER INSERTION N/A 02/12/2014   Procedure: TEMPORARY PACEMAKER INSERTION;  Surgeon: Lorretta Harp, MD;  Location: [facility] CATH LAB;  Service: Cardiovascular;  Laterality: N/A;  . VAGINAL HYSTERECTOMY  1972    Current Outpatient Medications  Medication Sig Dispense Refill  . amLODipine (NORVASC) 5 MG tablet Take 1 tablet (5 mg total) by mouth daily. 90 tablet 2  . aspirin 81 MG EC tablet Take 1 tablet (81 mg total) by mouth daily. 30 tablet 5  . atorvastatin (LIPITOR) 20 MG tablet Take 20 mg by mouth daily.    . budesonide-formoterol (SYMBICORT) 160-4.5 MCG/ACT inhaler Inhale 2 puffs into the lungs 2 (two) times daily. 1 Inhaler 3  . carvedilol (COREG) 6.25 MG tablet TAKE ONE TABLET  BY MOUTH TWICE DAILY WITH A MEAL 180 tablet 2  . FLOVENT HFA 220 MCG/ACT inhaler INHALE 1 PUFF BY MOUTH TWICE DAILY 12 g 0  . furosemide (LASIX) 40 MG tablet Take one tablet (40 mg) by mouth once daily as needed for swelling/ shortness of breath. Please make appt with Dr.Skains. 1st attempt 30 tablet 0  . gabapentin (NEURONTIN) 100 MG capsule Take 2 capsules (200 mg total) by mouth 2 (two) times daily. 360 capsule 2  . hydrochlorothiazide (MICROZIDE) 12.5 MG capsule Take 1 capsule (12.5 mg total) by mouth daily. 30 capsule 7  . ipratropium  (ATROVENT) 0.06 % nasal spray Place 2 sprays into both nostrils 4 (four) times daily. 15 mL 0  . losartan (COZAAR) 50 MG tablet Take 1 tablet (50 mg total) by mouth daily. 90 tablet 2  . montelukast (SINGULAIR) 10 MG tablet Take 1 tablet (10 mg total) by mouth at bedtime. 90 tablet 3  . oxybutynin (DITROPAN-XL) 10 MG 24 hr tablet Take 1 tablet (10 mg total) by mouth at bedtime. 90 tablet 0  . pantoprazole (PROTONIX) 40 MG tablet Take 1 tablet (40 mg total) by mouth daily. 90 tablet 3  . traZODone (DESYREL) 50 MG tablet Take 1/2 to 1 tablet at bedtime as needed for sleep 30 tablet 3   No current facility-administered medications for this visit.     Allergies:   Amoxicillin-pot clavulanate   Social History:  The patient  reports that she has never smoked. She has never used smokeless tobacco. She reports that she does not drink alcohol or use drugs.   Family History:  The patient's   family history includes Breast cancer in her sister and sister; Heart disease in her mother; Hypertension in her mother. She was adopted.   ROS:  Please see the history of present illness.   All other systems are personally reviewed and negative.    Exam:    Vital Signs:  BP 140/74   Pulse 90   Ht _0  (1.575 m)   Wt 176 lb (79.8 kg)   LMP  (LMP Unknown)   BMI 32.19 kg/m     Labs/Other Tests and Data Reviewed:    Recent Labs: 01/05/2018: ALT 17; BUN 30; Creatinine, Ser 1.39; Hemoglobin 15.1; Platelets 223.0; Potassium 4.1; Sodium 142  Personally reviewed    Wt Readings from Last 3 Encounters:  11/29/18 176 lb (79.8 kg)  09/23/18 184 lb 3.2 oz (83.6 kg)  09/01/18 183 lb 3.2 oz (83.1 kg)     Other studies personally reviewed: Additional studies/ records that were reviewed today include:As above       Last device remote is reviewed from [facility] PDF dated 2/20  which reveals normal device function,   arrhythmias - nonsustained atrial tachycardia    ASSESSMENT & PLAN:    Sinus node dysfunction   Hypertension  HFpEF  Polycythemia > 15g Hgb   Chronic Kidney Disease  Pacemaker implantation- Medtronic   BP well controlled  Euvolemic continue current meds  To see renal 6/20  Not sure why her Hgb is elevated esp with a GFR of about 50 -- no smoking /COPD to explain   Will defer to her PCP and renal who can weigh in on this        COVID 19 screen The patient denies symptoms of COVID 19 at this time.  The importance of social distancing was discussed today.  Follow-up:  12 m Next remote: As Scheduled   Current medicines  are reviewed at length with the patient today.   The patient does not have concerns regarding her medicines.  The following changes were made today:  none  Labs/ tests ordered today include:   No orders of the defined types were placed in this encounter.   Future tests ( post COVID )     Patient Risk:  after full review of this patients clinical status, I feel that they are at moderate risk at this time.  Today, I have spent 5* minutes with the patient with telehealth technology discussing the above.  Signed, Virl Axe, MD  11/29/2018 3:28 PM     [facility] [address] [place] [address] [phone] (office) [phone] (fax)

## 2018-11-30 LAB — CUP PACEART REMOTE DEVICE CHECK
Battery Remaining Longevity: 63 mo
Battery Voltage: 3 V
Brady Statistic AP VP Percent: 0.03 %
Brady Statistic AP VS Percent: 96.8 %
Brady Statistic AS VP Percent: 0 %
Brady Statistic AS VS Percent: 3.17 %
Brady Statistic RA Percent Paced: 96.56 %
Brady Statistic RV Percent Paced: 0.04 %
Date Time Interrogation Session: 20200512154209
Implantable Lead Implant Date: 20150729
Implantable Lead Implant Date: 20150729
Implantable Lead Location: 753859
Implantable Lead Location: 753860
Implantable Lead Model: 5076
Implantable Lead Model: 5076
Implantable Pulse Generator Implant Date: 20150729
Lead Channel Impedance Value: 361 Ohm
Lead Channel Impedance Value: 494 Ohm
Lead Channel Impedance Value: 494 Ohm
Lead Channel Impedance Value: 532 Ohm
Lead Channel Pacing Threshold Amplitude: 0.875 V
Lead Channel Pacing Threshold Amplitude: 1 V
Lead Channel Pacing Threshold Pulse Width: 0.4 ms
Lead Channel Pacing Threshold Pulse Width: 0.4 ms
Lead Channel Sensing Intrinsic Amplitude: 1.625 mV
Lead Channel Sensing Intrinsic Amplitude: 1.625 mV
Lead Channel Sensing Intrinsic Amplitude: 11.375 mV
Lead Channel Sensing Intrinsic Amplitude: 11.375 mV
Lead Channel Setting Pacing Amplitude: 2 V
Lead Channel Setting Pacing Amplitude: 2 V
Lead Channel Setting Pacing Pulse Width: 0.4 ms
Lead Channel Setting Sensing Sensitivity: 2 mV

## 2018-12-05 ENCOUNTER — Other Ambulatory Visit: Payer: Medicare Other

## 2018-12-14 NOTE — Progress Notes (Signed)
Remote pacemaker transmission.   

## 2019-01-12 ENCOUNTER — Other Ambulatory Visit: Payer: Self-pay | Admitting: Cardiology

## 2019-01-12 DIAGNOSIS — E1159 Type 2 diabetes mellitus with other circulatory complications: Secondary | ICD-10-CM

## 2019-01-18 ENCOUNTER — Other Ambulatory Visit: Payer: Self-pay | Admitting: Family Medicine

## 2019-01-18 DIAGNOSIS — F339 Major depressive disorder, recurrent, unspecified: Secondary | ICD-10-CM

## 2019-01-26 ENCOUNTER — Telehealth: Payer: Self-pay | Admitting: Family Medicine

## 2019-01-26 NOTE — Telephone Encounter (Signed)
Pts daughter called in reference to needing med refill on "furosemive"? For anxiety.Language barrier. Pt has soonest appt available. Please advise.

## 2019-01-27 NOTE — Telephone Encounter (Signed)
I spoke with patient and informed her that she would need to speak with Dr. Juleen China  before rx can be filled (Fluoxetine). Last filled was in 07/2017.  Patient verbalized understanding, appointment scheduled for next week.  I also informed pt that is she begins to have any thought of harming herself before her appointment, to please contact ER.

## 2019-02-01 ENCOUNTER — Other Ambulatory Visit: Payer: Self-pay

## 2019-02-01 ENCOUNTER — Encounter: Payer: Self-pay | Admitting: Family Medicine

## 2019-02-01 ENCOUNTER — Ambulatory Visit (INDEPENDENT_AMBULATORY_CARE_PROVIDER_SITE_OTHER): Payer: Medicare Other | Admitting: Family Medicine

## 2019-02-01 VITALS — BP 144/82 | HR 92 | Temp 98.5°F | Ht 62.0 in | Wt 179.2 lb

## 2019-02-01 DIAGNOSIS — R2689 Other abnormalities of gait and mobility: Secondary | ICD-10-CM | POA: Diagnosis not present

## 2019-02-01 DIAGNOSIS — M48062 Spinal stenosis, lumbar region with neurogenic claudication: Secondary | ICD-10-CM | POA: Diagnosis not present

## 2019-02-01 DIAGNOSIS — F4321 Adjustment disorder with depressed mood: Secondary | ICD-10-CM | POA: Diagnosis not present

## 2019-02-01 DIAGNOSIS — M3500 Sicca syndrome, unspecified: Secondary | ICD-10-CM | POA: Diagnosis not present

## 2019-02-01 DIAGNOSIS — I152 Hypertension secondary to endocrine disorders: Secondary | ICD-10-CM

## 2019-02-01 DIAGNOSIS — E1159 Type 2 diabetes mellitus with other circulatory complications: Secondary | ICD-10-CM

## 2019-02-01 DIAGNOSIS — E538 Deficiency of other specified B group vitamins: Secondary | ICD-10-CM | POA: Diagnosis not present

## 2019-02-01 DIAGNOSIS — E669 Obesity, unspecified: Secondary | ICD-10-CM | POA: Diagnosis not present

## 2019-02-01 DIAGNOSIS — E1169 Type 2 diabetes mellitus with other specified complication: Secondary | ICD-10-CM

## 2019-02-01 DIAGNOSIS — K219 Gastro-esophageal reflux disease without esophagitis: Secondary | ICD-10-CM

## 2019-02-01 DIAGNOSIS — E1122 Type 2 diabetes mellitus with diabetic chronic kidney disease: Secondary | ICD-10-CM | POA: Diagnosis not present

## 2019-02-01 DIAGNOSIS — E66811 Obesity, class 1: Secondary | ICD-10-CM

## 2019-02-01 DIAGNOSIS — G3184 Mild cognitive impairment, so stated: Secondary | ICD-10-CM | POA: Diagnosis not present

## 2019-02-01 DIAGNOSIS — N184 Chronic kidney disease, stage 4 (severe): Secondary | ICD-10-CM

## 2019-02-01 DIAGNOSIS — E559 Vitamin D deficiency, unspecified: Secondary | ICD-10-CM

## 2019-02-01 DIAGNOSIS — E785 Hyperlipidemia, unspecified: Secondary | ICD-10-CM

## 2019-02-01 DIAGNOSIS — K120 Recurrent oral aphthae: Secondary | ICD-10-CM

## 2019-02-01 DIAGNOSIS — I10 Essential (primary) hypertension: Secondary | ICD-10-CM

## 2019-02-01 DIAGNOSIS — G4733 Obstructive sleep apnea (adult) (pediatric): Secondary | ICD-10-CM | POA: Diagnosis not present

## 2019-02-01 DIAGNOSIS — G4700 Insomnia, unspecified: Secondary | ICD-10-CM

## 2019-02-01 DIAGNOSIS — I5030 Unspecified diastolic (congestive) heart failure: Secondary | ICD-10-CM

## 2019-02-01 LAB — CBC WITH DIFFERENTIAL/PLATELET
Basophils Absolute: 0.1 10*3/uL (ref 0.0–0.1)
Basophils Relative: 1.5 % (ref 0.0–3.0)
Eosinophils Absolute: 0.2 10*3/uL (ref 0.0–0.7)
Eosinophils Relative: 3.4 % (ref 0.0–5.0)
HCT: 41.3 % (ref 36.0–46.0)
Hemoglobin: 13.7 g/dL (ref 12.0–15.0)
Lymphocytes Relative: 25 % (ref 12.0–46.0)
Lymphs Abs: 1.6 10*3/uL (ref 0.7–4.0)
MCHC: 33.2 g/dL (ref 30.0–36.0)
MCV: 89.5 fl (ref 78.0–100.0)
Monocytes Absolute: 0.5 10*3/uL (ref 0.1–1.0)
Monocytes Relative: 8.4 % (ref 3.0–12.0)
Neutro Abs: 3.9 10*3/uL (ref 1.4–7.7)
Neutrophils Relative %: 61.7 % (ref 43.0–77.0)
Platelets: 263 10*3/uL (ref 150.0–400.0)
RBC: 4.62 Mil/uL (ref 3.87–5.11)
RDW: 14.5 % (ref 11.5–15.5)
WBC: 6.3 10*3/uL (ref 4.0–10.5)

## 2019-02-01 LAB — LIPID PANEL
Cholesterol: 231 mg/dL — ABNORMAL HIGH (ref 0–200)
HDL: 43.4 mg/dL (ref >39.00)
LDL Cholesterol: 149 mg/dL — ABNORMAL HIGH (ref 0–99)
NonHDL: 187.4
Total CHOL/HDL Ratio: 5
Triglycerides: 191 mg/dL — ABNORMAL HIGH (ref 0.0–149.0)
VLDL: 38.2 mg/dL (ref 0.0–40.0)

## 2019-02-01 LAB — COMPREHENSIVE METABOLIC PANEL WITH GFR
ALT: 13 U/L (ref 0–35)
AST: 14 U/L (ref 0–37)
Albumin: 3.7 g/dL (ref 3.5–5.2)
Alkaline Phosphatase: 124 U/L — ABNORMAL HIGH (ref 39–117)
BUN: 20 mg/dL (ref 6–23)
CO2: 28 meq/L (ref 19–32)
Calcium: 9.1 mg/dL (ref 8.4–10.5)
Chloride: 102 meq/L (ref 96–112)
Creatinine, Ser: 1.3 mg/dL — ABNORMAL HIGH (ref 0.40–1.20)
GFR: 39.5 mL/min — ABNORMAL LOW
Glucose, Bld: 147 mg/dL — ABNORMAL HIGH (ref 70–99)
Potassium: 4.4 meq/L (ref 3.5–5.1)
Sodium: 138 meq/L (ref 135–145)
Total Bilirubin: 0.4 mg/dL (ref 0.2–1.2)
Total Protein: 7.1 g/dL (ref 6.0–8.3)

## 2019-02-01 LAB — VITAMIN B12: Vitamin B-12: 223 pg/mL (ref 211–911)

## 2019-02-01 LAB — MAGNESIUM: Magnesium: 2 mg/dL (ref 1.5–2.5)

## 2019-02-01 LAB — VITAMIN D 25 HYDROXY (VIT D DEFICIENCY, FRACTURES): VITD: 24.56 ng/mL — ABNORMAL LOW (ref 30.00–100.00)

## 2019-02-01 MED ORDER — PANTOPRAZOLE SODIUM 40 MG PO TBEC: 40.0000 mg | DELAYED_RELEASE_TABLET | Freq: Every day | ORAL | 3 refills | Status: DC

## 2019-02-01 MED ORDER — FLUOXETINE HCL 10 MG PO CAPS: 10.0000 mg | ORAL_CAPSULE | Freq: Every day | ORAL | 3 refills | Status: AC

## 2019-02-01 MED ORDER — TRIAMCINOLONE ACETONIDE 0.1 % MT PSTE: 1.0000 "application " | PASTE | Freq: Two times a day (BID) | OROMUCOSAL | 12 refills | Status: DC

## 2019-02-01 NOTE — Progress Notes (Signed)
Heather [name] is a 77 y.o. female is here for follow up.  Assessment and Plan:   Heather [name] was seen today for depression.  Diagnoses and all orders for this visit:  Grief Comments: Grandson died by drug overdose recently. Was 44 years old, on vent for > 1 month. Struggling with depression. Orders: -     FLUoxetine (PROZAC) 10 MG capsule; Take 1 capsule (10 mg total) by mouth daily.  CKD (chronic kidney disease) stage 4, GFR 15-29 ml/min (HCC) -     CBC with Differential/Platelet -     Comprehensive metabolic panel -     Magnesium  Hypertension associated with diabetes ([place]) Comments: At goal. Continue current medications.  Neurogenic claudication due to lumbar spinal stenosis  Controlled type 2 diabetes mellitus with stage 4 chronic kidney disease, without long-term current use of insulin (HCC) -     Hemoglobin A1c  Sjogren's syndrome, with unspecified organ involvement (HCC)  Diastolic congestive heart failure, unspecified HF chronicity (HCC)  Mild cognitive impairment  OSA (obstructive sleep apnea)  Imbalance Comments: Reviewed fall precautions.   Hyperlipidemia associated with type 2 diabetes mellitus ([place]) -     Lipid panel  Obesity (BMI 30.0-34.9)  Vitamin D deficiency -     VITAMIN D 25 Hydroxy (Vit-D Deficiency, Fractures)  B12 deficiency -     Vitamin B12  Insomnia, unspecified type  Gastroesophageal reflux disease without esophagitis Comments: Controlled. Continue current medications. Orders: -     pantoprazole (PROTONIX) 40 MG tablet; Take 1 tablet (40 mg total) by mouth daily.  Oral aphthous ulcer Comments: Resolved but patient showed me a picture on her phone. Intermittent. No triggers noted. See orders. Orders: -     triamcinolone (KENALOG) 0.1 % paste; Use as directed 1 application in the mouth or throat 2 (two) times daily.   Health Maintenance:   Health Maintenance Due  Topic Date Due  . OPHTHALMOLOGY EXAM  01/01/1951  .  HEMOGLOBIN A1C  07/07/2018   Depression screen [facility] 2/9 02/01/2019 01/05/2018 08/03/2017  Decreased Interest 1 3 0  Down, Depressed, Hopeless 1 0 0  PHQ - 2 Score 2 3 0  Altered sleeping 2 0 -  Tired, decreased energy 2 0 -  Change in appetite 2 0 -  Feeling bad or failure about yourself  0 0 -  Trouble concentrating 0 0 -  Moving slowly or fidgety/restless 0 0 -  Suicidal thoughts 0 0 -  PHQ-9 Score 8 3 -  Difficult doing work/chores Not difficult at all Not difficult at all -   PMHx, SurgHx, SocialHx, FamHx, Medications, and Allergies were reviewed in the Visit Navigator and updated as appropriate.   Patient Active Problem List   Diagnosis Date Noted  . Obesity (BMI 30.0-34.9) 02/01/2019  . Hyperlipidemia associated with type 2 diabetes mellitus ([place]) 02/01/2019  . Primary osteoarthritis of right shoulder 05/17/2018  . CKD stage 3 due to type 2 diabetes mellitus ([place]) 10/07/2017  . Sinus node dysfunction ([place]) 10/07/2017  . Mild cognitive impairment 09/29/2017  . Osteoarthritis of cervical spine 06/04/2017  . Imbalance 05/30/2017  . Controlled type 2 diabetes mellitus with chronic kidney disease, without long-term current use of insulin ([place]) 04/27/2017  . Scoliosis of lumbar spine 06/02/2016  . Complete tear of right rotator cuff 06/02/2016  . Neurogenic claudication due to lumbar spinal stenosis 05/06/2016  . Right shoulder pain 05/06/2016  . Sjogren's syndrome ([place]) 04/27/2016  . Xerostomia 04/27/2016  . Postmenopausal atrophic vaginitis  09/16/2015  . CKD (chronic kidney disease) stage 4, GFR 15-29 ml/min (HCC) 05/06/2015  . Bilateral hip pain 05/06/2015  . Seborrheic keratoses 05/06/2015  . 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 Crohn's disease ([place]) 02/16/2014  . OSA (obstructive sleep apnea) 01/29/2014  . Pulmonary hypertension ([place]) 01/29/2014  . CAD (coronary artery disease) 01/29/2014  . Hypertension associated with diabetes ([place]) 01/29/2014  . Diastolic CHF ([place])  48/25/0037   Social History   Tobacco Use  . Smoking status: Never Smoker  . Smokeless tobacco: Never Used  Substance Use Topics  . Alcohol use: No  . Drug use: No   Current Medications and Allergies:   .  amLODipine (NORVASC) 5 MG tablet, Take 1 tablet (5 mg total) by mouth daily., Disp: 90 tablet, Rfl: 2 .  aspirin 81 MG EC tablet, Take 1 tablet (81 mg total) by mouth daily., Disp: 30 tablet, Rfl: 5 .  atorvastatin (LIPITOR) 20 MG tablet, Take 20 mg by mouth daily., Disp: , Rfl:  .  carvedilol (COREG) 6.25 MG tablet, TAKE 1 TABLET BY MOUTH TWICE DAILY WITH A MEAL, Disp: 180 tablet, Rfl: 3 .  furosemide (LASIX) 40 MG tablet, Take one tablet (40 mg) by mouth once daily as needed for swelling/ shortness of breath. Please make appt with Dr.Skains. 1st attempt, Disp: 30 tablet, Rfl: 0 .  gabapentin (NEURONTIN) 100 MG capsule, Take 2 capsules (200 mg total) by mouth 2 (two) times daily., Disp: 360 capsule, Rfl: 2 .  hydrochlorothiazide (MICROZIDE) 12.5 MG capsule, Take 1 capsule by mouth once daily, Disp: 90 capsule, Rfl: 3 .  losartan (COZAAR) 50 MG tablet, Take 1 tablet (50 mg total) by mouth daily., Disp: 90 tablet, Rfl: 2 .  montelukast (SINGULAIR) 10 MG tablet, Take 1 tablet (10 mg total) by mouth at bedtime., Disp: 90 tablet, Rfl: 3 .  oxybutynin (DITROPAN-XL) 10 MG 24 hr tablet, Take 1 tablet (10 mg total) by mouth at bedtime., Disp: 90 tablet, Rfl: 0 .  pantoprazole (PROTONIX) 40 MG tablet, Take 1 tablet (40 mg total) by mouth daily., Disp: 90 tablet, Rfl: 3 .  FLUoxetine (PROZAC) 10 MG capsule, Take 1 capsule (10 mg total) by mouth daily., Disp: 90 capsule, Rfl: 3   Allergies  Allergen Reactions  . Amoxicillin-Pot Clavulanate Swelling and Rash   Review of Systems   Pertinent items are noted in the HPI. Otherwise, ROS is negative.  Vitals:   Vitals:   02/01/19 1338  BP: (!) 144/82  Pulse: 92  Temp: 98.5 F (36.9 C)  TempSrc: Oral  SpO2: 94%  Weight: 179 lb 3.2 oz  (81.3 kg)  Height: 5' 2"  (1.575 m)     Body mass index is 32.78 kg/m.   Physical Exam:   General: Cooperative, alert and oriented, well developed, well nourished, in no acute distress. HEENT: Pupils equal round reactive light and extraocular movements intact. Conjunctivae and lids unremarkable. No pallor or cyanosis, dentition good. Neck: No thyromegaly.  Cardiovascular: Regular rhythm.  Lungs: Normal work of breathing. Clear bilaterally without rales, rhonchi, or wheezing.  Abdomen: Soft, nontender, no masses. Normal bowel sounds. Extremities: No clubbing, cyanosis, erythema. No edema.  Skin: Warm and dry. Neurologic: No focal deficits.   . Reviewed expectations re: course of current medical issues. . Discussed self-management of symptoms. . Outlined signs and symptoms indicating need for more acute intervention. . Patient verbalized understanding and all questions were answered. Marland Kitchen Health Maintenance issues including appropriate healthy diet, exercise, and smoking avoidance were discussed with patient. Marland Kitchen  See orders for this visit as documented in the electronic medical record. . Patient received an After Visit Summary.  Briscoe Deutscher, DO [place] 02/02/2019

## 2019-02-02 LAB — HEMOGLOBIN A1C: Hgb A1c MFr Bld: 6.5 % (ref 4.6–6.5)

## 2019-02-10 ENCOUNTER — Other Ambulatory Visit: Payer: Self-pay | Admitting: Family Medicine

## 2019-02-17 ENCOUNTER — Other Ambulatory Visit: Payer: Self-pay

## 2019-02-17 ENCOUNTER — Telehealth: Payer: Self-pay | Admitting: Family Medicine

## 2019-02-17 DIAGNOSIS — E7800 Pure hypercholesterolemia, unspecified: Secondary | ICD-10-CM

## 2019-02-17 MED ORDER — ROSUVASTATIN CALCIUM 10 MG PO TABS: 5.0000 mg | ORAL_TABLET | Freq: Every day | ORAL | 3 refills | Status: DC

## 2019-02-17 NOTE — Telephone Encounter (Signed)
Called and spoke to pt's daughter regarding lab results.  Shared all the information regarding Vit D and Vit B12 supplements.  Pt does want a prescription for a statin per Dr. Alcario Drought result note.  Verified correct pharmacy is [facility].

## 2019-02-17 NOTE — Telephone Encounter (Signed)
Call in Crestor 5 mg po daily.

## 2019-02-17 NOTE — Telephone Encounter (Signed)
returning call regarding lab results

## 2019-02-17 NOTE — Telephone Encounter (Signed)
Crestor sent today.  Per Dr. Juleen China

## 2019-02-28 ENCOUNTER — Ambulatory Visit (INDEPENDENT_AMBULATORY_CARE_PROVIDER_SITE_OTHER): Payer: Medicare Other | Admitting: *Deleted

## 2019-02-28 DIAGNOSIS — I495 Sick sinus syndrome: Secondary | ICD-10-CM | POA: Diagnosis not present

## 2019-02-28 DIAGNOSIS — I5030 Unspecified diastolic (congestive) heart failure: Secondary | ICD-10-CM

## 2019-02-28 LAB — CUP PACEART REMOTE DEVICE CHECK
Battery Remaining Longevity: 54 mo
Battery Voltage: 2.99 V
Brady Statistic AP VP Percent: 0.03 %
Brady Statistic AP VS Percent: 97.15 %
Brady Statistic AS VP Percent: 0 %
Brady Statistic AS VS Percent: 2.82 %
Brady Statistic RA Percent Paced: 97.05 %
Brady Statistic RV Percent Paced: 0.03 %
Date Time Interrogation Session: 20200811154503
Implantable Lead Implant Date: 20150729
Implantable Lead Implant Date: 20150729
Implantable Lead Location: 753859
Implantable Lead Location: 753860
Implantable Lead Model: 5076
Implantable Lead Model: 5076
Implantable Pulse Generator Implant Date: 20150729
Lead Channel Impedance Value: 361 Ohm
Lead Channel Impedance Value: 494 Ohm
Lead Channel Impedance Value: 513 Ohm
Lead Channel Impedance Value: 551 Ohm
Lead Channel Pacing Threshold Amplitude: 0.875 V
Lead Channel Pacing Threshold Amplitude: 1 V
Lead Channel Pacing Threshold Pulse Width: 0.4 ms
Lead Channel Pacing Threshold Pulse Width: 0.4 ms
Lead Channel Sensing Intrinsic Amplitude: 1.625 mV
Lead Channel Sensing Intrinsic Amplitude: 1.625 mV
Lead Channel Sensing Intrinsic Amplitude: 12.125 mV
Lead Channel Sensing Intrinsic Amplitude: 12.125 mV
Lead Channel Setting Pacing Amplitude: 2 V
Lead Channel Setting Pacing Amplitude: 2 V
Lead Channel Setting Pacing Pulse Width: 0.4 ms
Lead Channel Setting Sensing Sensitivity: 2 mV

## 2019-03-09 NOTE — Progress Notes (Signed)
Remote pacemaker transmission.   

## 2019-04-14 ENCOUNTER — Telehealth: Payer: Self-pay | Admitting: Physical Therapy

## 2019-04-14 NOTE — Telephone Encounter (Signed)
Copied from [place] [id number]. Topic: General - Inquiry >> Apr 14, 2019 12:15 PM Richardo Priest, NT wrote: Reason for CRM: Patient's daughter called in stating she called ortho care to get her mother an injection for her hip pain, however they advised her to have PCP place an order for CT scan. Please advise and call back is [phone].

## 2019-04-14 NOTE — Telephone Encounter (Signed)
Ok to place order?

## 2019-04-14 NOTE — Telephone Encounter (Signed)
Okay to do referral. Why CT scan?

## 2019-04-17 ENCOUNTER — Other Ambulatory Visit: Payer: Self-pay

## 2019-04-17 DIAGNOSIS — G8929 Other chronic pain: Secondary | ICD-10-CM

## 2019-04-17 NOTE — Telephone Encounter (Signed)
Order in.

## 2019-04-17 NOTE — Telephone Encounter (Signed)
-----   Message ----- From: Magnus Sinning, MD Sent: 10/03/2018   6:03 AM EDT To: Ailene Rud, NT Subject: RE: Please Advise                              She is going to need a CT scan of the lumbar spine before we provide any further epidurals.  She has had a couple in the past with good results but we really need advanced imaging at least one time to see.  She does have a pacemaker so she cannot have MRI.  Also we probably should look at not scheduling her anytime soon for an injection given her age and comorbidity with the current Covid19 issues.   [place] for CT scan referral?

## 2019-04-17 NOTE — Telephone Encounter (Signed)
Okay for CT scan. Routine.

## 2019-05-01 NOTE — Telephone Encounter (Signed)
See note

## 2019-05-01 NOTE — Telephone Encounter (Signed)
Do you know anything about this? 

## 2019-05-01 NOTE — Telephone Encounter (Signed)
I see the message about them waiting 3 weeks for this - the referral was placed on 9/28 and it was scheduled that same day.  10/21 at 230.  Arrival time will probably be at 210.  [place]

## 2019-05-01 NOTE — Telephone Encounter (Signed)
Pt's daughter calling.  States that they haven't heard anything about the CT scan.  Pt states that they have been waiting for three weeks for a return call so that pt can have a CT scan to look at getting an injection.

## 2019-05-02 NOTE — Telephone Encounter (Signed)
Called let daughter know when CT app is. She will call If any questions.

## 2019-05-07 NOTE — Progress Notes (Signed)
Heather [name] is a 78 y.o. female is here for follow up.  History of Present Illness:   Water quality scientist, CMA, acting as scribe for Dr. Juleen China.  HPI:  Patient states she has pain in her right hip "all the time".  She is waiting for an injection at [facility]  CT in 2 days.  Difficulty with sleeping through the night due to paresthesias.  Currently taking gabapentin 200 mg twice daily.  No sedation or other side effects from that right now.  Endorses some increased cough recently because she ran out of Symbicort.  That was controlling her persistent cough.  Patient found to have B12 and vitamin D deficiency of her last labs.  She did start vitamin D but has not been taking vitamin B12.  Open to getting an injection today.  Wants flu shot today.  Health Maintenance Due  Topic Date Due  . OPHTHALMOLOGY EXAM  01/01/1951   Depression screen [facility] 2/9 02/01/2019 01/05/2018 08/03/2017  Decreased Interest 1 3 0  Down, Depressed, Hopeless 1 0 0  PHQ - 2 Score 2 3 0  Altered sleeping 2 0 -  Tired, decreased energy 2 0 -  Change in appetite 2 0 -  Feeling bad or failure about yourself  0 0 -  Trouble concentrating 0 0 -  Moving slowly or fidgety/restless 0 0 -  Suicidal thoughts 0 0 -  PHQ-9 Score 8 3 -  Difficult doing work/chores Not difficult at all Not difficult at all -   PMHx, SurgHx, SocialHx, FamHx, Medications, and Allergies were reviewed in the Visit Navigator and updated as appropriate.   Patient Active Problem List   Diagnosis Date Noted  . Statin declined 05/08/2019  . Mixed hyperlipidemia 05/08/2019  . Obesity (BMI 30.0-34.9) 02/01/2019  . Hyperlipidemia associated with type 2 diabetes mellitus ([place]) 02/01/2019  . Primary osteoarthritis of right shoulder 05/17/2018  . CKD stage 3 due to type 2 diabetes mellitus ([place]) 10/07/2017  . Sinus node dysfunction ([place]) 10/07/2017  . Mild cognitive impairment 09/29/2017  . Osteoarthritis of cervical spine 06/04/2017  . Imbalance  05/30/2017  . Controlled type 2 diabetes mellitus with chronic kidney disease, without long-term current use of insulin ([place]) 04/27/2017  . Scoliosis of lumbar spine 06/02/2016  . Complete tear of right rotator cuff 06/02/2016  . Neurogenic claudication due to lumbar spinal stenosis 05/06/2016  . Right shoulder pain 05/06/2016  . Sjogren's syndrome ([place]) 04/27/2016  . Xerostomia 04/27/2016  . Postmenopausal atrophic vaginitis 09/16/2015  . CKD (chronic kidney disease) stage 4, GFR 15-29 ml/min (HCC) 05/06/2015  . Bilateral hip pain 05/06/2015  . Seborrheic keratoses 05/06/2015  . 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 Crohn's disease ([place]) 02/16/2014  . OSA (obstructive sleep apnea) 01/29/2014  . Pulmonary hypertension ([place]) 01/29/2014  . CAD (coronary artery disease) 01/29/2014  . Hypertension associated with diabetes ([place]) 01/29/2014  . Diastolic CHF ([place]) 89/38/1017   Social History   Tobacco Use  . Smoking status: Never Smoker  . Smokeless tobacco: Never Used  Substance Use Topics  . Alcohol use: No  . Drug use: No   Current Medications and Allergies   Current Outpatient Medications:  .  amLODipine (NORVASC) 5 MG tablet, Take 1 tablet (5 mg total) by mouth daily., Disp: 90 tablet, Rfl: 2 .  aspirin 81 MG EC tablet, Take 1 tablet (81 mg total) by mouth daily., Disp: 30 tablet, Rfl: 5 .  atorvastatin (LIPITOR) 20 MG tablet, Take 20 mg by mouth daily.,  Disp: , Rfl:  .  carvedilol (COREG) 6.25 MG tablet, TAKE 1 TABLET BY MOUTH TWICE DAILY WITH A MEAL, Disp: 180 tablet, Rfl: 3 .  FLUoxetine (PROZAC) 10 MG capsule, Take 1 capsule (10 mg total) by mouth daily., Disp: 90 capsule, Rfl: 3 .  furosemide (LASIX) 40 MG tablet, Take one tablet (40 mg) by mouth once daily as needed for swelling/ shortness of breath. Please make appt with Dr.Skains. 1st attempt, Disp: 30 tablet, Rfl: 0 .  gabapentin (NEURONTIN) 100 MG capsule, 2 TABS IN AM, 2 TABS IN AFTERNOON, 3 TABS AT NIGHT, Disp: 210  capsule, Rfl: 2 .  hydrochlorothiazide (MICROZIDE) 12.5 MG capsule, Take 1 capsule by mouth once daily, Disp: 90 capsule, Rfl: 3 .  losartan (COZAAR) 50 MG tablet, Take 1 tablet (50 mg total) by mouth daily., Disp: 90 tablet, Rfl: 2 .  montelukast (SINGULAIR) 10 MG tablet, Take 1 tablet (10 mg total) by mouth at bedtime., Disp: 90 tablet, Rfl: 3 .  oxybutynin (DITROPAN-XL) 10 MG 24 hr tablet, Take 1 tablet (10 mg total) by mouth at bedtime., Disp: 90 tablet, Rfl: 2 .  pantoprazole (PROTONIX) 40 MG tablet, Take 1 tablet (40 mg total) by mouth daily., Disp: 90 tablet, Rfl: 3 .  rosuvastatin (CRESTOR) 10 MG tablet, Take 0.5 tablets (5 mg total) by mouth daily., Disp: 90 tablet, Rfl: 3 .  triamcinolone (KENALOG) 0.1 % paste, Use as directed 1 application in the mouth or throat 2 (two) times daily., Disp: 5 g, Rfl: 12 .  budesonide-formoterol (SYMBICORT) 160-4.5 MCG/ACT inhaler, Inhale 2 puffs into the lungs 2 (two) times daily., Disp: 1 Inhaler, Rfl: 3   Allergies  Allergen Reactions  . Amoxicillin-Pot Clavulanate Swelling and Rash   Review of Systems   Pertinent items are noted in the HPI. Otherwise, a complete ROS is negative.  Vitals   Vitals:   05/08/19 1008  BP: 138/72  Pulse: 74  Temp: 98.1 F (36.7 C)  SpO2: 96%  Weight: 182 lb (82.6 kg)  Height: 5' 2"  (1.575 m)     Body mass index is 33.29 kg/m.  Physical Exam   Physical Exam Vitals signs and nursing note reviewed.  HENT:     Head: Normocephalic and atraumatic.  Eyes:     Pupils: Pupils are equal, round, and reactive to light.  Neck:     Musculoskeletal: Normal range of motion and neck supple.  Cardiovascular:     Rate and Rhythm: Normal rate and regular rhythm.     Heart sounds: Normal heart sounds.  Pulmonary:     Effort: Pulmonary effort is normal.  Abdominal:     Palpations: Abdomen is soft.  Skin:    General: Skin is warm.  Neurological:     General: No focal deficit present.  Psychiatric:         Behavior: Behavior normal.    Assessment and Plan   Heather [name] was seen today for follow-up.  Diagnoses and all orders for this visit:  Mixed hyperlipidemia -     rosuvastatin (CRESTOR) 10 MG tablet; Take 0.5 tablets (5 mg total) by mouth daily.  Neurogenic claudication due to lumbar spinal stenosis -     gabapentin (NEURONTIN) 100 MG capsule; 2 TABS IN AM, 2 TABS IN AFTERNOON, 3 TABS AT NIGHT  Oral aphthous ulcer Comments: Resolved but patient showed me a picture on her phone. Intermittent. No triggers noted. See orders. Orders: -     triamcinolone (KENALOG) 0.1 % paste; Use as directed  1 application in the mouth or throat 2 (two) times daily.  Statin declined  CKD (chronic kidney disease) stage 4, GFR 15-29 ml/min (HCC)  Obesity (BMI 30.0-34.9)  Bilateral hip pain  B12 deficiency Comments: B12 injection today. Orders: -     cyanocobalamin ((VITAMIN B-12)) injection 1,000 mcg  Vitamin D deficiency  Overactive bladder -     oxybutynin (DITROPAN-XL) 10 MG 24 hr tablet; Take 1 tablet (10 mg total) by mouth at bedtime.  Cough variant asthma -     budesonide-formoterol (SYMBICORT) 160-4.5 MCG/ACT inhaler; Inhale 2 puffs into the lungs 2 (two) times daily.  Need for immunization against influenza -     Flu Vaccine QUAD High Dose(Fluad)   . Orders and follow up as documented in [place], reviewed diet, exercise and weight control, cardiovascular risk and specific lipid/LDL goals reviewed, reviewed medications and side effects in detail.  . Reviewed expectations re: course of current medical issues. . Outlined signs and symptoms indicating need for more acute intervention. . Patient verbalized understanding and all questions were answered. . Patient received an After Visit Summary.  CMA served as Education administrator during this visit. History, Physical, and Plan performed by medical provider. The above documentation has been reviewed and is accurate and complete. Briscoe Deutscher,  D.O.  Briscoe Deutscher, DO [place], [place] 05/08/2019

## 2019-05-08 ENCOUNTER — Encounter: Payer: Self-pay | Admitting: Family Medicine

## 2019-05-08 ENCOUNTER — Other Ambulatory Visit: Payer: Self-pay

## 2019-05-08 ENCOUNTER — Ambulatory Visit (INDEPENDENT_AMBULATORY_CARE_PROVIDER_SITE_OTHER): Payer: Medicare Other | Admitting: Family Medicine

## 2019-05-08 VITALS — BP 138/72 | HR 74 | Temp 98.1°F | Ht 62.0 in | Wt 182.0 lb

## 2019-05-08 DIAGNOSIS — E538 Deficiency of other specified B group vitamins: Secondary | ICD-10-CM

## 2019-05-08 DIAGNOSIS — Z5320 Procedure and treatment not carried out because of patient's decision for unspecified reasons: Secondary | ICD-10-CM

## 2019-05-08 DIAGNOSIS — J45991 Cough variant asthma: Secondary | ICD-10-CM | POA: Diagnosis not present

## 2019-05-08 DIAGNOSIS — E559 Vitamin D deficiency, unspecified: Secondary | ICD-10-CM

## 2019-05-08 DIAGNOSIS — M48062 Spinal stenosis, lumbar region with neurogenic claudication: Secondary | ICD-10-CM

## 2019-05-08 DIAGNOSIS — E782 Mixed hyperlipidemia: Secondary | ICD-10-CM | POA: Insufficient documentation

## 2019-05-08 DIAGNOSIS — K120 Recurrent oral aphthae: Secondary | ICD-10-CM | POA: Diagnosis not present

## 2019-05-08 DIAGNOSIS — N184 Chronic kidney disease, stage 4 (severe): Secondary | ICD-10-CM | POA: Diagnosis not present

## 2019-05-08 DIAGNOSIS — N3281 Overactive bladder: Secondary | ICD-10-CM | POA: Diagnosis not present

## 2019-05-08 DIAGNOSIS — M25552 Pain in left hip: Secondary | ICD-10-CM

## 2019-05-08 DIAGNOSIS — M25551 Pain in right hip: Secondary | ICD-10-CM | POA: Diagnosis not present

## 2019-05-08 DIAGNOSIS — E669 Obesity, unspecified: Secondary | ICD-10-CM

## 2019-05-08 DIAGNOSIS — Z23 Encounter for immunization: Secondary | ICD-10-CM

## 2019-05-08 MED ORDER — BUDESONIDE-FORMOTEROL FUMARATE 160-4.5 MCG/ACT IN AERO: 2.0000 | INHALATION_SPRAY | Freq: Two times a day (BID) | RESPIRATORY_TRACT | 3 refills | Status: AC

## 2019-05-08 MED ORDER — CYANOCOBALAMIN 1000 MCG/ML IJ SOLN
1000.0000 ug | Freq: Once | INTRAMUSCULAR | Status: AC
  Administered 2019-05-08: 1000 ug via INTRAMUSCULAR

## 2019-05-08 MED ORDER — ROSUVASTATIN CALCIUM 10 MG PO TABS: 5.0000 mg | ORAL_TABLET | Freq: Every day | ORAL | 3 refills | Status: DC

## 2019-05-08 MED ORDER — GABAPENTIN 100 MG PO CAPS: ORAL_CAPSULE | ORAL | 2 refills | Status: DC

## 2019-05-08 MED ORDER — OXYBUTYNIN CHLORIDE ER 10 MG PO TB24: 10.0000 mg | ORAL_TABLET | Freq: Every day | ORAL | 2 refills | Status: AC

## 2019-05-08 MED ORDER — TRIAMCINOLONE ACETONIDE 0.1 % MT PSTE: 1.0000 "application " | PASTE | Freq: Two times a day (BID) | OROMUCOSAL | 12 refills | Status: AC

## 2019-05-08 NOTE — Patient Instructions (Signed)
Vitamin B12 oral Qu es este medicamento? La CIANOCOBALAMINA es una forma artificial de vitamina B12. La vitamina B12 es indispensable para el desarrollo de clulas sanguneas, clulas nerviosas y protenas saludables en el cuerpo. Tambin ayuda con la metabolizacin normal de grasas y carbohidratos. Se combina con una dieta saludable para prevenir o tratar los niveles bajos de vitamina B-12. Este medicamento puede ser utilizado para otros usos; si tiene alguna pregunta consulte con su proveedor de atencin mdica o con su farmacutico. Qu le debo informar a mi profesional de la salud antes de tomar este medicamento? Necesita saber si usted presenta alguno de los [employer] o situaciones:  anemia  enfermedad renal  enfermedad de [place]  trastorno de malabsorcin  una reaccin alrgica o inusual a la cianocobalamina, al cobalto, a otros medicamentos, alimentos, colorantes o conservantes  si est embarazada o buscando quedar embarazada  si est amamantando a un beb Cmo debo utilizar este medicamento? Tome este medicamento por va oral con un vaso de agua. Siga las instrucciones de la etiqueta o del envase del medicamento. Si est tomando las tabletas, no mastique, corte, ni triture Coca-Cola. Si est usando una solucin de vitamina, utilice una cuchara o un gotero especialmente marcados para medir cada dosis. Si no tiene estos artculos, consulte a su farmacutico. Las cucharas domsticas no son exactas. Para obtener los Levi Strauss, tome esta vitamina con alimentos. Tome su medicamento a intervalos regulares. No tome su medicamento con una frecuencia mayor a la indicada. Hable con su pediatra para informarse acerca del uso de este medicamento en nios. Aunque este medicamento se puede Advertising account executive, existen precauciones que deben tomarse. Sobredosis: Pngase en contacto inmediatamente con un centro toxicolgico o una sala de urgencia si usted cree que  haya tomado demasiado medicamento. ATENCIN: Con[employer]ra Foods es solo para usted. No comparta este medicamento con nadie. Qu sucede si me olvido de una dosis? Si olvida una dosis, tmela lo antes posible. Si es casi la hora de la prxima dosis, tome slo esa dosis. No tome dosis adicionales o dobles. Qu puede interactuar con este medicamento?  alcohol  cido aminosaliclico  colchicina  medicamentos que suprimen la mdula sea, como quimioterapia, cloranfenicol Puede ser que esta lista no menciona todas las posibles interacciones. Informe a su profesional de [employer] Home	[place] de [employer] Corporation productos a base de hierbas, medicamentos de [place] o suplementos nutritivos que est tomando. Si usted fuma, consume bebidas alcohlicas o si utiliza drogas ilegales, indqueselo tambin a su profesional de KB Home	[place]. Algunas sustancias pueden interactuar con su medicamento. A qu debo estar atento al usar Coca-Cola? Asegrese de seguir una dieta saludable. El suplemento de vitamina no elimina la necesidad de [place] equilibrada. Algunos alimentos que contienen vitamina B-12 naturalmente son pescados, mariscos, yema de [place], [place] y quesos fermentados. El tomar demasiado de esta vitamina puede ser peligroso. Consulte a su mdico o su proveedor de atencin Apache Corporation cantidad [place] para usted. Qu efectos secundarios puedo tener al Masco Corporation este medicamento? Efectos secundarios que debe informar a su mdico o a Barrister's clerk de la salud tan pronto como sea posible:  Chief of Staff como erupcin cutnea, picazn o urticarias, hinchazn de la cara, labios o lengua  problemas respiratorios  dolor, opresin en el pecho Efectos secundarios que, por lo general, no requieren atencin mdica (debe informarlos a su mdico o a su profesional de la salud si persisten o si son molestos):  diarrea Puede ser que esta lista no  menciona todos los posibles efectos secundarios. Comunquese a su  mdico por asesoramiento mdico Humana Inc. Usted puede informar los efectos secundarios a la FDA por telfono al 1-800-FDA-1088. Dnde debo guardar mi medicina? Mantngala fuera del alcance de los nios. [place]a a FPL Group, entre 15 y 46 grados C (23 y 38 grados F). Protjala del calor y de la luz. Deseche todo el medicamento que no haya utilizado, despus de la fecha de vencimiento. ATENCIN: Este folleto es un resumen. Puede ser que no cubra toda la posible informacin. Si usted tiene preguntas acerca de esta medicina, consulte con su mdico, su farmacutico o su profesional de Technical sales engineer.  2020 Elsevier/Gold Standard (2016-08-06 00:00:00)

## 2019-05-09 ENCOUNTER — Other Ambulatory Visit: Payer: Self-pay | Admitting: Family Medicine

## 2019-05-09 DIAGNOSIS — G8929 Other chronic pain: Secondary | ICD-10-CM

## 2019-05-09 DIAGNOSIS — M5441 Lumbago with sciatica, right side: Secondary | ICD-10-CM

## 2019-05-10 ENCOUNTER — Inpatient Hospital Stay: Admission: RE | Admit: 2019-05-10 | Payer: Medicare Other | Source: Ambulatory Visit

## 2019-05-16 ENCOUNTER — Ambulatory Visit
Admission: RE | Admit: 2019-05-16 | Discharge: 2019-05-16 | Disposition: A | Discharge status: Home / Self Care | Payer: Medicare Other | Source: Ambulatory Visit | Attending: Family Medicine | Admitting: Family Medicine

## 2019-05-16 ENCOUNTER — Other Ambulatory Visit: Payer: Self-pay

## 2019-05-16 DIAGNOSIS — M5441 Lumbago with sciatica, right side: Secondary | ICD-10-CM

## 2019-05-16 DIAGNOSIS — M545 Low back pain: Secondary | ICD-10-CM | POA: Diagnosis not present

## 2019-05-16 DIAGNOSIS — G8929 Other chronic pain: Secondary | ICD-10-CM

## 2019-05-23 ENCOUNTER — Other Ambulatory Visit: Payer: Self-pay

## 2019-05-23 DIAGNOSIS — M48062 Spinal stenosis, lumbar region with neurogenic claudication: Secondary | ICD-10-CM

## 2019-05-31 ENCOUNTER — Ambulatory Visit (INDEPENDENT_AMBULATORY_CARE_PROVIDER_SITE_OTHER): Payer: Medicare Other | Admitting: *Deleted

## 2019-05-31 DIAGNOSIS — I495 Sick sinus syndrome: Secondary | ICD-10-CM

## 2019-05-31 DIAGNOSIS — M5416 Radiculopathy, lumbar region: Secondary | ICD-10-CM | POA: Diagnosis not present

## 2019-05-31 LAB — CUP PACEART REMOTE DEVICE CHECK
Battery Remaining Longevity: 54 mo
Battery Voltage: 2.99 V
Brady Statistic AP VP Percent: 0.13 %
Brady Statistic AP VS Percent: 98.98 %
Brady Statistic AS VP Percent: 0.1 %
Brady Statistic AS VS Percent: 0.79 %
Brady Statistic RA Percent Paced: 98.74 %
Brady Statistic RV Percent Paced: 0.21 %
Date Time Interrogation Session: 20201111161346
Implantable Lead Implant Date: 20150729
Implantable Lead Implant Date: 20150729
Implantable Lead Location: 753859
Implantable Lead Location: 753860
Implantable Lead Model: 5076
Implantable Lead Model: 5076
Implantable Pulse Generator Implant Date: 20150729
Lead Channel Impedance Value: 323 Ohm
Lead Channel Impedance Value: 475 Ohm
Lead Channel Impedance Value: 475 Ohm
Lead Channel Impedance Value: 532 Ohm
Lead Channel Pacing Threshold Amplitude: 0.75 V
Lead Channel Pacing Threshold Amplitude: 1 V
Lead Channel Pacing Threshold Pulse Width: 0.4 ms
Lead Channel Pacing Threshold Pulse Width: 0.4 ms
Lead Channel Sensing Intrinsic Amplitude: 1.75 mV
Lead Channel Sensing Intrinsic Amplitude: 1.75 mV
Lead Channel Sensing Intrinsic Amplitude: 10.125 mV
Lead Channel Sensing Intrinsic Amplitude: 10.125 mV
Lead Channel Setting Pacing Amplitude: 2 V
Lead Channel Setting Pacing Amplitude: 2 V
Lead Channel Setting Pacing Pulse Width: 0.4 ms
Lead Channel Setting Sensing Sensitivity: 2 mV

## 2019-06-12 ENCOUNTER — Telehealth: Payer: Self-pay | Admitting: Cardiology

## 2019-06-12 NOTE — Telephone Encounter (Signed)
Patient's daughter states she needs to come with her mother to her appt tomorrow to help translate.

## 2019-06-12 NOTE — Telephone Encounter (Signed)
Left message for daughter OK to come to appt with pt per Dr Candee Furbish.

## 2019-06-13 ENCOUNTER — Encounter: Payer: Self-pay | Admitting: Cardiology

## 2019-06-13 ENCOUNTER — Ambulatory Visit (INDEPENDENT_AMBULATORY_CARE_PROVIDER_SITE_OTHER): Payer: Medicare Other | Admitting: Cardiology

## 2019-06-13 ENCOUNTER — Other Ambulatory Visit: Payer: Self-pay

## 2019-06-13 VITALS — BP 110/60 | HR 72 | Ht 62.0 in | Wt 179.6 lb

## 2019-06-13 DIAGNOSIS — I10 Essential (primary) hypertension: Secondary | ICD-10-CM

## 2019-06-13 DIAGNOSIS — I5032 Chronic diastolic (congestive) heart failure: Secondary | ICD-10-CM

## 2019-06-13 NOTE — Progress Notes (Signed)
Cardiology Office Note:    Date:  06/13/2019   ID:  Heather [name], [name] [birth year], MRN [id number]  PCP:  Briscoe Deutscher, DO  Cardiologist:  Candee Furbish, MD  Electrophysiologist:  None   Referring MD: Briscoe Deutscher, DO     History of Present Illness:    Heather [name] is a 78 y.o. female here for follow-up of pacemaker, diastolic heart failure and aortic stenosis.  Former patient of Dr. Sherryl Barters.  Dr. Caryl Comes has been following her pacemaker.  Medtronic.  Overall she feels well without any fevers chills nausea vomiting syncope bleeding.  Really no complaints of shortness of breath today.  Creatinine 1.3, hemoglobin 13.7 hemoglobin A1c 6.5, LDL 149.   Past Medical History:  Diagnosis Date  . CHF (congestive heart failure) ([place])   . Chronic back pain   . Crohn's disease ([place])   . GERD (gastroesophageal reflux disease)   . High cholesterol   . History of diabetes mellitus, type II   . History of echocardiogram    Echo 12/16: EF 60-65%, no RWMA, Gr 2 DD, mild AS (mean 13 mmHg), mild AI, MAC, trivial MS, mild MR, mild LAE, mild RVE, mild to mod TR, PASP 36 mmHg  . Hypertension   . Migraine    "1-2 times/yr" (02/16/2014)  . Murmur, cardiac   . OSA on CPAP   . 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 Pulmonary hypertension ([place])   . Sinus bradycardia 01/29/2014  . Skin cancer 2003   "3 cut off face" (02/16/2014)    Past Surgical History:  Procedure Laterality Date  . CARDIAC CATHETERIZATION  1995; 2005  . CATARACT EXTRACTION Bilateral 1982  . ESOPHAGOGASTRODUODENOSCOPY (EGD) WITH PROPOFOL N/A 12/28/2016   Procedure: ESOPHAGOGASTRODUODENOSCOPY (EGD) WITH PROPOFOL;  Surgeon: Otis Brace, MD;  Location: [facility];  Service: Gastroenterology;  Laterality: N/A;  . INSERT / REPLACE / REMOVE PACEMAKER  02/14/2014  . PERMANENT PACEMAKER INSERTION N/A 02/14/2014   Procedure: PERMANENT PACEMAKER INSERTION;  Surgeon: Deboraha Sprang, MD;  [facility] C[facility] LAB;   Service: Cardiovascular;  Laterality: N/A;  . SKIN CANCER EXCISION  2003    "face X 3" (02/16/2014)  . TEMPORARY PACEMAKER INSERTION N/A 02/12/2014   Procedure: TEMPORARY PACEMAKER INSERTION;  Surgeon: Lorretta Harp, MD;  [facility];  Service: Cardiovascular;  Laterality: N/A;  . VAGINAL HYSTERECTOMY  1972    Current Medications: Current Meds  Medication Sig  . amLODipine (NORVASC) 5 MG tablet Take 1 tablet (5 mg total) by mouth daily.  Marland Kitchen aspirin 81 MG EC tablet Take 1 tablet (81 mg total) by mouth daily.  . budesonide-formoterol (SYMBICORT) 160-4.5 MCG/ACT inhaler Inhale 2 puffs into the lungs 2 (two) times daily.  . carvedilol (COREG) 6.25 MG tablet TAKE 1 TABLET BY MOUTH TWICE DAILY WITH A MEAL  . FLUoxetine (PROZAC) 10 MG capsule Take 1 capsule (10 mg total) by mouth daily.  . furosemide (LASIX) 40 MG tablet Take one tablet (40 mg) by mouth once daily as needed for swelling/ shortness of breath. Please make appt with Dr.Nesreen Albano. 1st attempt  . gabapentin (NEURONTIN) 100 MG capsule 2 TABS IN AM, 2 TABS IN AFTERNOON, 3 TABS AT NIGHT  . hydrochlorothiazide (MICROZIDE) 12.5 MG capsule Take 1 capsule by mouth once daily  . losartan (COZAAR) 50 MG tablet Take 1 tablet (50 mg total) by mouth daily.  . montelukast (SINGULAIR) 10 MG tablet Take 1 tablet (10 mg total) by mouth at bedtime.  Marland Kitchen oxybutynin (DITROPAN-XL) 10  MG 24 hr tablet Take 1 tablet (10 mg total) by mouth at bedtime.  . pantoprazole (PROTONIX) 40 MG tablet Take 1 tablet (40 mg total) by mouth daily.  . rosuvastatin (CRESTOR) 10 MG tablet Take 0.5 tablets (5 mg total) by mouth daily.  Marland Kitchen triamcinolone (KENALOG) 0.1 % paste Use as directed 1 application in the mouth or throat 2 (two) times daily.     Allergies:   Amoxicillin-pot clavulanate   Social History   Socioeconomic History  . Marital status: Widowed    Spouse name: Not on file  . Number of children: 5  . Years of education: middle school  . Highest  education level: Not on file  Occupational History  . Not on file  Social Needs  . Financial resource strain: Not on file  . Food insecurity    Worry: Not on file    Inability: Not on file  . Transportation needs    Medical: Not on file    Non-medical: Not on file  Tobacco Use  . Smoking status: Never Smoker  . Smokeless tobacco: Never Used  Substance and Sexual Activity  . Alcohol use: No  . Drug use: No  . Sexual activity: Never  Lifestyle  . Physical activity    Days per week: Not on file    Minutes per session: Not on file  . Stress: Not on file  Relationships  . Social Herbalist on phone: Not on file    Gets together: Not on file    Attends religious service: Not on file    Active member of club or organization: Not on file    Attends meetings of clubs or organizations: Not on file    Relationship status: Not on file  Other Topics Concern  . Not on file  Social History Narrative   Moved here from [place] in October 2013.   Right handed   1 cup of caffeine daily     Family History: The patient's family history includes Breast cancer in her sister and sister; Heart disease in her mother; Hypertension in her mother. She was adopted.  ROS:   Please see the history of present illness.    Positive for cough.  No chest pain.  Occasional shortness of breath when bending over.  All other systems reviewed and are negative.  EKGs/Labs/Other Studies Reviewed:    The following studies were reviewed today:  ECHO: 04/18/16 - Left ventricle: The cavity size was normal. There was mild   concentric hypertrophy. Systolic function was normal. The   estimated ejection fraction was in the range of 55% to 60%. Wall   motion was normal; there were no regional wall motion   abnormalities. Doppler parameters are consistent with abnormal   left ventricular relaxation (grade 1 diastolic dysfunction). - Aortic valve: Valve mobility was restricted. There was mild    stenosis. There was mild regurgitation. Valve area (VTI): 1.39   cm^2. Valve area (Vmax): 1.33 cm^2. Valve area (Vmean): 1.26   cm^2. - Mitral valve: Moderately to severely calcified annulus. There was   mild regurgitation. - Left atrium: The atrium was moderately dilated. - Pulmonary arteries: Systolic pressure was mildly increased. PA   peak pressure: 32 mm Hg (S).  EKG: 06/13/2019 electronic pacemaker atrial 72 inferior infarct pattern.  Recent Labs: 02/01/2019: ALT 13; BUN 20; Creatinine, Ser 1.30; Hemoglobin 13.7; Magnesium 2.0; Platelets 263.0; Potassium 4.4; Sodium 138  Recent Lipid Panel    Component Value Date/Time  CHOL 231 (H) 02/01/2019 1410   TRIG 191.0 (H) 02/01/2019 1410   HDL 43.40 02/01/2019 1410   CHOLHDL 5 02/01/2019 1410   VLDL 38.2 02/01/2019 1410   LDLCALC 149 (H) 02/01/2019 1410    Physical Exam:    VS:  BP 110/60   Pulse 72   Ht _0  (1.575 m)   Wt 179 lb 9.6 oz (81.5 kg)   LMP  (LMP Unknown)   SpO2 97%   BMI 32.85 kg/m     Wt Readings from Last 3 Encounters:  06/13/19 179 lb 9.6 oz (81.5 kg)  05/08/19 182 lb (82.6 kg)  02/01/19 179 lb 3.2 oz (81.3 kg)     GEN: Well nourished, well developed, in no acute distress  HEENT: normal  Neck: no JVD, carotid bruits, or masses Cardiac: RRR; soft systolic murmurs, rubs, or gallops,no edema  Respiratory:  clear to auscultation bilaterally, normal work of breathing GI: soft, nontender, nondistended, + BS MS: no deformity or atrophy  Skin: warm and dry, no rash Neuro:  Alert and Oriented x 3, Strength and sensation are intact Psych: euthymic mood, full affect   ASSESSMENT:    1. Chronic diastolic heart failure ([place])   2. Essential hypertension    PLAN:    In order of problems listed above:  Chronic diastolic heart failure - Currently appears euvolemic.  She may take her Lasix as needed.  Her cough currently sounds more like bronchitis.  Nonetheless, she could try 2 days or so of furosemide to  see if this helps.  Told her that she could try Mucinex.  Her family member present for translation.  She will also have Dr. Juleen China check this out.  Essential hypertension -Very well controlled current regimen.  Medications reviewed.  Pacemaker -Medtronic, operating well.  Dr. Caryl Comes.  States that she feels much better since this implantation a few years ago.  Sick sinus syndrome -Pacemaker.  One year follow-up  Medication Adjustments/Labs and Tests Ordered: Current medicines are reviewed at length with the patient today.  Concerns regarding medicines are outlined above.  Orders Placed This Encounter  Procedures  . EKG 12-Lead   No orders of the defined types were placed in this encounter.   Patient Instructions  Medication Instructions:  The current medical regimen is effective;  continue present plan and medications.  *If you need a refill on your cardiac medications before your next appointment, please call your pharmacy*  Follow-Up: At [facility] [place], you and your health needs are our priority.  As part of our continuing mission to provide you with exceptional heart care, we have created designated Provider Care Teams.  These Care Teams include your primary Cardiologist (physician) and Advanced Practice Providers (APPs -  Physician Assistants and Nurse Practitioners) who all work together to provide you with the care you need, when you need it.  Your next appointment:   1 year(s)  The format for your next appointment:   In Person  Provider:   Candee Furbish, MD  Thank you for choosing [facility]!!         Signed, Candee Furbish, MD  06/13/2019 10:45 AM    [place]

## 2019-06-13 NOTE — Patient Instructions (Signed)
Medication Instructions:  The current medical regimen is effective;  continue present plan and medications.  *If you need a refill on your cardiac medications before your next appointment, please call your pharmacy*  Follow-Up: At [facility], you and your health needs are our priority.  As part of our continuing mission to provide you with exceptional heart care, we have created designated Provider Care Teams.  These Care Teams include your primary Cardiologist (physician) and Advanced Practice Providers (APPs -  Physician Assistants and Nurse Practitioners) who all work together to provide you with the care you need, when you need it.  Your next appointment:   1 year(s)  The format for your next appointment:   In Person  Provider:   Candee Furbish, MD  Thank you for choosing [facility]!!

## 2019-06-21 NOTE — Progress Notes (Signed)
Remote pacemaker transmission.   

## 2019-06-24 DIAGNOSIS — M5416 Radiculopathy, lumbar region: Secondary | ICD-10-CM | POA: Diagnosis not present

## 2019-07-02 ENCOUNTER — Other Ambulatory Visit: Payer: Self-pay | Admitting: Family Medicine

## 2019-07-02 DIAGNOSIS — R05 Cough: Secondary | ICD-10-CM

## 2019-07-02 DIAGNOSIS — I152 Hypertension secondary to endocrine disorders: Secondary | ICD-10-CM

## 2019-07-02 DIAGNOSIS — I10 Essential (primary) hypertension: Secondary | ICD-10-CM

## 2019-07-02 DIAGNOSIS — R059 Cough, unspecified: Secondary | ICD-10-CM

## 2019-07-02 DIAGNOSIS — E1159 Type 2 diabetes mellitus with other circulatory complications: Secondary | ICD-10-CM

## 2019-08-11 ENCOUNTER — Ambulatory Visit: Payer: Medicare Other | Admitting: Family Medicine

## 2019-08-16 ENCOUNTER — Other Ambulatory Visit: Payer: Self-pay

## 2019-08-16 ENCOUNTER — Ambulatory Visit (INDEPENDENT_AMBULATORY_CARE_PROVIDER_SITE_OTHER): Payer: Medicare Other | Admitting: Family Medicine

## 2019-08-16 ENCOUNTER — Encounter: Payer: Self-pay | Admitting: Family Medicine

## 2019-08-16 VITALS — BP 148/82 | HR 73 | Temp 98.1°F | Ht 62.0 in | Wt 177.8 lb

## 2019-08-16 DIAGNOSIS — E1159 Type 2 diabetes mellitus with other circulatory complications: Secondary | ICD-10-CM

## 2019-08-16 DIAGNOSIS — E1122 Type 2 diabetes mellitus with diabetic chronic kidney disease: Secondary | ICD-10-CM

## 2019-08-16 DIAGNOSIS — E1169 Type 2 diabetes mellitus with other specified complication: Secondary | ICD-10-CM | POA: Diagnosis not present

## 2019-08-16 DIAGNOSIS — G3184 Mild cognitive impairment, so stated: Secondary | ICD-10-CM

## 2019-08-16 DIAGNOSIS — E785 Hyperlipidemia, unspecified: Secondary | ICD-10-CM | POA: Diagnosis not present

## 2019-08-16 DIAGNOSIS — N184 Chronic kidney disease, stage 4 (severe): Secondary | ICD-10-CM

## 2019-08-16 DIAGNOSIS — F339 Major depressive disorder, recurrent, unspecified: Secondary | ICD-10-CM | POA: Diagnosis not present

## 2019-08-16 DIAGNOSIS — M8580 Other specified disorders of bone density and structure, unspecified site: Secondary | ICD-10-CM

## 2019-08-16 DIAGNOSIS — R05 Cough: Secondary | ICD-10-CM | POA: Insufficient documentation

## 2019-08-16 DIAGNOSIS — I5032 Chronic diastolic (congestive) heart failure: Secondary | ICD-10-CM

## 2019-08-16 DIAGNOSIS — I10 Essential (primary) hypertension: Secondary | ICD-10-CM

## 2019-08-16 DIAGNOSIS — R058 Other specified cough: Secondary | ICD-10-CM

## 2019-08-16 HISTORY — DX: Other specified disorders of bone density and structure, unspecified site: M85.80

## 2019-08-16 LAB — COMPREHENSIVE METABOLIC PANEL
ALT: 21 U/L (ref 0–35)
AST: 19 U/L (ref 0–37)
Albumin: 3.9 g/dL (ref 3.5–5.2)
Alkaline Phosphatase: 149 U/L — ABNORMAL HIGH (ref 39–117)
BUN: 26 mg/dL — ABNORMAL HIGH (ref 6–23)
CO2: 28 mEq/L (ref 19–32)
Calcium: 9.6 mg/dL (ref 8.4–10.5)
Chloride: 106 mEq/L (ref 96–112)
Creatinine, Ser: 1.32 mg/dL — ABNORMAL HIGH (ref 0.40–1.20)
GFR: 38.76 mL/min — ABNORMAL LOW (ref >60.00)
Glucose, Bld: 84 mg/dL (ref 70–99)
Potassium: 4.3 mEq/L (ref 3.5–5.1)
Sodium: 140 mEq/L (ref 135–145)
Total Bilirubin: 0.4 mg/dL (ref 0.2–1.2)
Total Protein: 7.4 g/dL (ref 6.0–8.3)

## 2019-08-16 LAB — LIPID PANEL
Cholesterol: 149 mg/dL (ref 0–200)
HDL: 48.5 mg/dL (ref >39.00)
LDL Cholesterol: 82 mg/dL (ref 0–99)
NonHDL: 100.53
Total CHOL/HDL Ratio: 3
Triglycerides: 93 mg/dL (ref 0.0–149.0)
VLDL: 18.6 mg/dL (ref 0.0–40.0)

## 2019-08-16 LAB — POCT GLYCOSYLATED HEMOGLOBIN (HGB A1C): Hemoglobin A1C: 6.1 % — AB (ref 4.0–5.6)

## 2019-08-16 MED ORDER — LOSARTAN POTASSIUM-HCTZ 50-12.5 MG PO TABS: 1.0000 | ORAL_TABLET | Freq: Every day | ORAL | 3 refills | Status: DC

## 2019-08-16 NOTE — Progress Notes (Signed)
Subjective  CC:  Chief Complaint  Patient presents with  . Transitions Of Care    TOC from Dr. Juleen China  . Diabetes    Patient does not check DM at home  . Hyperlipidemia    Patient is currently taking Atorvastatin. No side effects.  . Hypertension    Monitors readings at home. Readings are good. No SOB or dizziness. Healthy eating and dieting.    HPI: Heather [name] is a 79 y.o. female who presents to [facility] today to establish care with me as a new patient.  Reviewed chart x 25 minutes prior to her visit. Has multiple medical problems. Reviewed notes, consults and lab results.  She has the following concerns or needs:  Very pleasant [place] 79 year old Spanish-speaking female who is here with her daughter.  She sees a cardiologist for her chronic diastolic heart failure and history of sick sinus syndrome requiring a pacemaker.  I reviewed her recent cardiology notes and fortunately the symptoms are stable.  She is on hydrochlorothiazide daily for blood pressure control but also takes Lasix very intermittently for volume overload.  She says she only needs this 2-3 times per month.  Recent lab work revealed normal creatinine and electrolytes.  She denies chest pain.  Chronic upper airway cough syndrome on Singulair and inhalers.  She is a type II diabetic and control has been good recently.  From chart review, her A1c's have not been higher than 6.5 since being here at this office.  She currently is diet controlled.  Her chart does report complications including diabetic nephropathy.  Immunizations are up-to-date.  She denies symptoms of hyperglycemia.  She denies peripheral neuropathy symptoms.  Chronic kidney disease followed by nephrology  Essential hypertension on 4 low-dose antihypertensives including a diuretic, ARB, amlodipine, and low-dose beta-blocker.  Control has been good although it is mildly elevated today.  She feels  well.  Chronic diastolic heart failure stable currently.  Hyperlipidemia on Crestor 20 mg since last summer.  Due for recheck.  LDL goal is less than 80.  She is tolerating this medication well and said that she is compliant.  History of depression on low-dose Prozac.  She reports a normal healthy happy mood now.  Osteopenia by bone density.  Currently up-to-date.  Mild cognitive impairment listed on chart.  Assessment  1. Controlled type 2 diabetes mellitus with stage 4 chronic kidney disease, without long-term current use of insulin (HCC)   2. Upper airway cough syndrome   3. 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 Hypertension associated with diabetes ([place]) Chronic  5. Osteopenia, unspecified location   6. Mild cognitive impairment   7. Hyperlipidemia associated with type 2 diabetes mellitus ([place])   8. Chronic diastolic heart failure (HCC) Chronic     Plan   Total time spent on patient care for today was 50 minutes including chart review, charting, and 30 minutes of face-to-face counseling.  Diet-controlled diabetes: A1c is 6.1 today.  Continue diabetic diet.  Hyperlipidemia: Recheck cholesterol on Crestor 20 mg.  Hypertension: On 4 drug regimen.  Mildly elevated today but was very normal at recent cardiology office visit.  Will compliant HCTZ losartan into 1 pill to decrease pill burden, however may stop HCTZ altogether and use Lasix as needed.  Monitor potassium and renal function.  Patient is overactive bladder symptoms so can decrease HCTZ if able to.  She is on amlodipine but no lower extremity edema noted today.  Upper airway  cough syndrome: Again reassured, doubt related to chronic CHF.  Health maintenance currently up-to-date.  Follow up: 3 months to recheck diabetes and blood pressure Orders Placed This Encounter  Procedures  . Comprehensive metabolic panel  . Lipid panel  . POCT HgB A1C   Meds ordered this encounter  Medications  .  losartan-hydrochlorothiazide (HYZAAR) 50-12.5 MG tablet    Sig: Take 1 tablet by mouth daily.    Dispense:  90 tablet    Refill:  3     Depression screen [facility] 2/9 02/01/2019 01/05/2018 08/03/2017 05/27/2016 05/06/2016  Decreased Interest 1 3 0 0 0  Down, Depressed, Hopeless 1 0 0 0 0  PHQ - 2 Score 2 3 0 0 0  Altered sleeping 2 0 - 1 0  Tired, decreased energy 2 0 - 0 0  Change in appetite 2 0 - 0 0  Feeling bad or failure about yourself  0 0 - 0 0  Trouble concentrating 0 0 - 0 0  Moving slowly or fidgety/restless 0 0 - 0 0  Suicidal thoughts 0 0 - 0 0  PHQ-9 Score 8 3 - 1 0  Difficult doing work/chores Not difficult at all Not difficult at all - - -    We updated and reviewed the patient's past history in detail and it is documented below.  Patient Active Problem List   Diagnosis Date Noted  . Upper airway cough syndrome 08/16/2019  . Depression, recurrent ([place]) 08/16/2019  . Osteopenia 08/16/2019    T=-1.8 dexa 2019   . Obesity (BMI 30.0-34.9) 02/01/2019  . Hyperlipidemia associated with type 2 diabetes mellitus ([place]) 02/01/2019  . Primary osteoarthritis of right shoulder 05/17/2018  . Sick sinus syndrome ([place]) 10/07/2017  . Mild cognitive impairment 09/29/2017  . Osteoarthritis of cervical spine 06/04/2017    05/17/2018 XR C-spine IMPRESSION: 1. Curvature cervical spine convex left. 2. Fusion C2-3. 3. C5-6 mild to moderate disc space narrowing. Minimal anterior slip C5. 4. C6-7 minimal to mild disc space narrowing. Minimal anterior slip C6. 5. Facet degenerative changes and uncinate hypertrophy resulting in: Moderate right-sided C2-3 foraminal narrowing. Moderate to marked right-sided and mild to moderate left-sided C3-4 foraminal narrowing. Minimal right-sided C4-5 foraminal narrowing. Mild to moderate left-sided C5-6 foraminal narrowing.    . Controlled type 2 diabetes mellitus with chronic kidney disease, without long-term current use of insulin ([place]) 04/27/2017   . Neurogenic claudication due to lumbar spinal stenosis 05/06/2016    Of note she cannot have an MRI (possibly conditional) and from a surgical standpoint would need a multilevel instrumented fusion.  She has seen Dr. Lorin Mercy at [facility]   . CKD (chronic kidney disease) stage 4, GFR 15-29 ml/min (HCC) 05/06/2015  . 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 OSA (obstructive sleep apnea) 01/29/2014  . Pulmonary hypertension ([place]) 01/29/2014  . CAD (coronary artery disease) 01/29/2014  . Hypertension associated with diabetes ([place]) 01/29/2014  . Diastolic CHF ([place]) 53/74/8270   Health Maintenance  Topic Date Due  . OPHTHALMOLOGY EXAM  01/01/1951  . FOOT EXAM  02/01/2020  . HEMOGLOBIN A1C  02/13/2020  . DEXA SCAN  05/30/2020  . TETANUS/TDAP  01/06/2028  . INFLUENZA VACCINE  Completed  . PNA vac Low Risk Adult  Completed   Immunization History  Administered Date(s) Administered  . Fluad Quad(high Dose 65+) 05/08/2019  . Influenza, High Dose Seasonal PF 04/26/2017  . Influenza,inj,Quad PF,6+ Mos 03/14/2014, 05/06/2015, 05/06/2016  . Pneumococcal Conjugate-13 01/05/2018  . Pneumococcal Polysaccharide-23 05/27/2016  . Tdap 01/05/2018  Current Meds  Medication Sig  . amLODipine (NORVASC) 5 MG tablet Take 1 tablet by mouth once daily  . aspirin 81 MG EC tablet Take 1 tablet (81 mg total) by mouth daily.  . budesonide-formoterol (SYMBICORT) 160-4.5 MCG/ACT inhaler Inhale 2 puffs into the lungs 2 (two) times daily.  . carvedilol (COREG) 6.25 MG tablet TAKE 1 TABLET BY MOUTH TWICE DAILY WITH A MEAL  . furosemide (LASIX) 40 MG tablet Take one tablet (40 mg) by mouth once daily as needed for swelling/ shortness of breath. Please make appt with Dr.Skains. 1st attempt  . montelukast (SINGULAIR) 10 MG tablet Take 1 tablet (10 mg total) by mouth at bedtime.  Marland Kitchen oxybutynin (DITROPAN-XL) 10 MG 24 hr tablet Take 1 tablet (10 mg total) by mouth at bedtime.  . pantoprazole (PROTONIX) 40 MG tablet  Take 1 tablet (40 mg total) by mouth daily.  . rosuvastatin (CRESTOR) 20 MG tablet Take 1 tablet (20 mg total) by mouth daily.  . traZODone (DESYREL) 50 MG tablet Take 50 mg by mouth at bedtime.  . [DISCONTINUED] gabapentin (NEURONTIN) 100 MG capsule 2 TABS IN AM, 2 TABS IN AFTERNOON, 3 TABS AT NIGHT  . [DISCONTINUED] hydrochlorothiazide (MICROZIDE) 12.5 MG capsule Take 1 capsule by mouth once daily  . [DISCONTINUED] losartan (COZAAR) 50 MG tablet Take 1 tablet by mouth once daily  . [DISCONTINUED] rosuvastatin (CRESTOR) 10 MG tablet Take 0.5 tablets (5 mg total) by mouth daily.    Allergies: Patient is allergic to amoxicillin-pot clavulanate. Past Medical History Patient  has a past medical history of CHF (congestive heart failure) ([employer]), Chronic back pain, Complete tear of right rotator cuff (06/02/2016), Crohn's disease ([place]), GERD (gastroesophageal reflux disease), High cholesterol, History of diabetes mellitus, type II, History of echocardiogram, Hypertension, Migraine, Murmur, cardiac, OSA on CPAP, Osteopenia (08/16/2019), Pacemaker-Medtronic (02/16/2014), Pulmonary hypertension ([place]), Sinus bradycardia (01/29/2014), and Skin cancer (2003). Past Surgical History Patient  has a past surgical history that includes Insert / replace / remove pacemaker (02/14/2014); Vaginal hysterectomy (1972); Cataract extraction (Bilateral, 1982); Cardiac catheterization (1995; 2005); Skin cancer excision (2003 ); temporary pacemaker insertion (N/A, 02/12/2014); permanent pacemaker insertion (N/A, 02/14/2014); and Esophagogastroduodenoscopy (egd) with propofol (N/A, 12/28/2016). Family History: Patient family history includes Breast cancer in her sister and sister; Heart disease in her mother; Hypertension in her mother. She was adopted. Social History:  Patient  reports that she has never smoked. She has never used smokeless tobacco. She reports that she does not drink alcohol or use drugs.  Review of  Systems: Constitutional: negative for fever or malaise Ophthalmic: negative for photophobia, double vision or loss of vision Cardiovascular: negative for chest pain, dyspnea on exertion, or new LE swelling Respiratory: negative for SOB or persistent cough Gastrointestinal: negative for abdominal pain, change in bowel habits or melena Genitourinary: negative for dysuria or gross hematuria Musculoskeletal: negative for new gait disturbance or muscular weakness Integumentary: negative for new or persistent rashes Neurological: negative for TIA or stroke symptoms Psychiatric: negative for SI or delusions Allergic/Immunologic: negative for hives  Patient Care Team    Relationship Specialty Notifications Start End  Leamon Arnt, MD PCP - General Family Medicine  08/16/19   Edrick Oh, MD Consulting Physician Nephrology  08/25/18   Jerline Pain, MD Consulting Physician Cardiology  08/16/19     Objective  Vitals: BP (!) 148/82 (BP Location: Left Arm, Patient Position: Sitting, Cuff Size: Normal)   Pulse 73   Temp 98.1 F (36.7 C) (Temporal)   Ht  5' 2"  (1.575 m)   Wt 177 lb 12.8 oz (80.6 kg)   LMP  (LMP Unknown)   SpO2 96%   BMI 32.52 kg/m  General:  Well developed, well nourished, no acute distress  Psych:  Alert and oriented,normal mood and affect, appears happy HEENT:  Normocephalic, atraumatic, non-icteric sclera, PERRL,  supple neck without adenopathy, mass or thyromegaly Cardiovascular:  RRR without gallop, rub or murmur Respiratory:  Good breath sounds bilaterally, CTAB with normal respiratory effort   Commons side effects, risks, benefits, and alternatives for medications and treatment plan prescribed today were discussed, and the patient expressed understanding of the given instructions. Patient is instructed to call or message via MyChart if he/she has any questions or concerns regarding our treatment plan. No barriers to understanding were identified. We discussed Red Flag  symptoms and signs in detail. Patient expressed understanding regarding what to do in case of urgent or emergency type symptoms.   Medication list was reconciled, printed and provided to the patient in AVS. Patient instructions and summary information was reviewed with the patient as documented in the AVS. This note was prepared with assistance of Dragon voice recognition software. Occasional wrong-word or sound-a-like substitutions may have occurred due to the inherent limitations of voice recognition software  This visit occurred during the SARS-CoV-2 public health emergency.  Safety protocols were in place, including screening questions prior to the visit, additional usage of staff PPE, and extensive cleaning of exam room while observing appropriate contact time as indicated for disinfecting solutions.

## 2019-08-16 NOTE — Patient Instructions (Signed)
Please return in 3 months for diabetes follow up  I may change your blood pressure pills.  We will call you with your cholesterol results and I will refill your cholesterol medication as well.   If you have any questions or concerns, please don't hesitate to send me a message via MyChart or call the office at [phone]. Thank you for visiting with Heather [name] today! It's our pleasure caring for you.

## 2019-08-17 NOTE — Progress Notes (Signed)
Please call patient: I have reviewed his/her lab results. All results are stable. No other changes needed at this time. Follow up as directed.

## 2019-08-30 ENCOUNTER — Ambulatory Visit (INDEPENDENT_AMBULATORY_CARE_PROVIDER_SITE_OTHER): Payer: Medicare Other | Admitting: *Deleted

## 2019-08-30 DIAGNOSIS — Z950 Presence of cardiac pacemaker: Secondary | ICD-10-CM

## 2019-09-01 LAB — CUP PACEART REMOTE DEVICE CHECK
Battery Remaining Longevity: 47 mo
Battery Voltage: 2.98 V
Brady Statistic AP VP Percent: 0.03 %
Brady Statistic AP VS Percent: 97.95 %
Brady Statistic AS VP Percent: 0 %
Brady Statistic AS VS Percent: 2.02 %
Brady Statistic RA Percent Paced: 97.91 %
Brady Statistic RV Percent Paced: 0.03 %
Date Time Interrogation Session: 20210211142650
Implantable Lead Implant Date: 20150729
Implantable Lead Implant Date: 20150729
Implantable Lead Location: 753859
Implantable Lead Location: 753860
Implantable Lead Model: 5076
Implantable Lead Model: 5076
Implantable Pulse Generator Implant Date: 20150729
Lead Channel Impedance Value: 361 Ohm
Lead Channel Impedance Value: 437 Ohm
Lead Channel Impedance Value: 475 Ohm
Lead Channel Impedance Value: 494 Ohm
Lead Channel Pacing Threshold Amplitude: 0.75 V
Lead Channel Pacing Threshold Amplitude: 1 V
Lead Channel Pacing Threshold Pulse Width: 0.4 ms
Lead Channel Pacing Threshold Pulse Width: 0.4 ms
Lead Channel Sensing Intrinsic Amplitude: 1.375 mV
Lead Channel Sensing Intrinsic Amplitude: 1.375 mV
Lead Channel Sensing Intrinsic Amplitude: 11.625 mV
Lead Channel Sensing Intrinsic Amplitude: 11.625 mV
Lead Channel Setting Pacing Amplitude: 2 V
Lead Channel Setting Pacing Amplitude: 2 V
Lead Channel Setting Pacing Pulse Width: 0.4 ms
Lead Channel Setting Sensing Sensitivity: 2 mV

## 2019-09-01 NOTE — Progress Notes (Signed)
PPM Remote  

## 2019-09-15 ENCOUNTER — Ambulatory Visit: Payer: Medicare Other | Attending: Internal Medicine

## 2019-09-15 DIAGNOSIS — Z23 Encounter for immunization: Secondary | ICD-10-CM | POA: Insufficient documentation

## 2019-09-15 NOTE — Progress Notes (Signed)
   Covid-19 Vaccination Clinic  Name:  Heather [name]    MRN: [id number] DOB: 09/28/1940  09/15/2019  Heather [name] was observed post Covid-19 immunization for 15 minutes without incidence. She was provided with Vaccine Information Sheet and instruction to access the V-Safe system.   Heather [name] was instructed to call 911 with any severe reactions post vaccine: Marland Kitchen Difficulty breathing  . Swelling of your face and throat  . A fast heartbeat  . A bad rash all over your body  . Dizziness and weakness    Immunizations Administered    Name Date Dose VIS Date Route   Pfizer COVID-19 Vaccine 09/15/2019  9:44 AM 0.3 mL 06/30/2019 Intramuscular   Manufacturer: [place]   Lot: [id number]   [place]: [address]-6

## 2019-10-10 ENCOUNTER — Ambulatory Visit: Payer: Medicare Other | Attending: Internal Medicine

## 2019-10-10 DIAGNOSIS — Z23 Encounter for immunization: Secondary | ICD-10-CM

## 2019-10-10 NOTE — Progress Notes (Signed)
   Covid-19 Vaccination Clinic  Name:  Heather [name]    MRN: [id number] DOB: 02/12/1941  10/10/2019  Heather [name] was observed post Covid-19 immunization for 15 minutes without incident. She was provided with Vaccine Information Sheet and instruction to access the V-Safe system.   Heather [name] was instructed to call 911 with any severe reactions post vaccine: Marland Kitchen Difficulty breathing  . Swelling of face and throat  . A fast heartbeat  . A bad rash all over body  . Dizziness and weakness   Immunizations Administered    Name Date Dose VIS Date Route   Pfizer COVID-19 Vaccine 10/10/2019  9:54 AM 0.3 mL 06/30/2019 Intramuscular   Manufacturer: [place]   Lot: [id number]   [place]: [address]-4

## 2019-10-19 ENCOUNTER — Encounter (HOSPITAL_COMMUNITY): Payer: Self-pay

## 2019-10-19 ENCOUNTER — Ambulatory Visit (HOSPITAL_COMMUNITY)
Admission: EM | Admit: 2019-10-19 | Discharge: 2019-10-19 | Disposition: A | Discharge status: Home / Self Care | Payer: Medicare Other | Attending: Urgent Care | Admitting: Urgent Care

## 2019-10-19 ENCOUNTER — Other Ambulatory Visit: Payer: Self-pay

## 2019-10-19 DIAGNOSIS — M545 Low back pain, unspecified: Secondary | ICD-10-CM

## 2019-10-19 DIAGNOSIS — G8929 Other chronic pain: Secondary | ICD-10-CM

## 2019-10-19 DIAGNOSIS — M5416 Radiculopathy, lumbar region: Secondary | ICD-10-CM

## 2019-10-19 DIAGNOSIS — N183 Chronic kidney disease, stage 3 unspecified: Secondary | ICD-10-CM

## 2019-10-19 DIAGNOSIS — M48061 Spinal stenosis, lumbar region without neurogenic claudication: Secondary | ICD-10-CM

## 2019-10-19 MED ORDER — HYDROCODONE-ACETAMINOPHEN 5-325 MG PO TABS: 1.0000 | ORAL_TABLET | Freq: Three times a day (TID) | ORAL | 0 refills | Status: AC | PRN

## 2019-10-19 MED ORDER — METHYLPREDNISOLONE SODIUM SUCC 125 MG IJ SOLR
INTRAMUSCULAR | Status: AC
  Filled 2019-10-19: qty 2

## 2019-10-19 MED ORDER — METHYLPREDNISOLONE SODIUM SUCC 125 MG IJ SOLR
125.0000 mg | Freq: Once | INTRAMUSCULAR | Status: AC
  Administered 2019-10-19: 125 mg via INTRAMUSCULAR

## 2019-10-19 NOTE — ED Triage Notes (Signed)
Pt daughter states that the Pt has pain in her lower back and both legs. Pt  daughter states she a going issues with her lower back pain.

## 2019-10-19 NOTE — ED Provider Notes (Signed)
[place]   MRN: [id number] DOB: 1941/06/28  Subjective:   Heather [name] is a 79 y.o. female presenting for 1 day history of cute onset of severe low back pain with radiation into her right leg but admits that she has pain going into the leg as well.  Patient states that symptoms started after her large dog jumped on her lap.  Denies any falls otherwise.  Denies incontinence, urinary changes, changes in bowel habits.  Denies weakness.  Has a history of spinal stenosis and is working with an Secondary school teacher at [employer].  Has previously gotten injections in her back.  Has been given tramadol but had to stop this due to dizziness and risk of falls.  She did better with hydrocodone.  Does not take anything like this regularly.  No current facility-administered medications for this encounter.  Current Outpatient Medications:  .  amLODipine (NORVASC) 5 MG tablet, Take 1 tablet by mouth once daily, Disp: 90 tablet, Rfl: 0 .  aspirin 81 MG EC tablet, Take 1 tablet (81 mg total) by mouth daily., Disp: 30 tablet, Rfl: 5 .  budesonide-formoterol (SYMBICORT) 160-4.5 MCG/ACT inhaler, Inhale 2 puffs into the lungs 2 (two) times daily., Disp: 1 Inhaler, Rfl: 3 .  carvedilol (COREG) 6.25 MG tablet, TAKE 1 TABLET BY MOUTH TWICE DAILY WITH A MEAL, Disp: 180 tablet, Rfl: 3 .  FLUoxetine (PROZAC) 10 MG capsule, Take 1 capsule (10 mg total) by mouth daily. (Patient not taking: Reported on 08/16/2019), Disp: 90 capsule, Rfl: 3 .  furosemide (LASIX) 40 MG tablet, Take one tablet (40 mg) by mouth once daily as needed for swelling/ shortness of breath. Please make appt with Dr.Skains. 1st attempt, Disp: 30 tablet, Rfl: 0 .  losartan-hydrochlorothiazide (HYZAAR) 50-12.5 MG tablet, Take 1 tablet by mouth daily., Disp: 90 tablet, Rfl: 3 .  montelukast (SINGULAIR) 10 MG tablet, Take 1 tablet (10 mg total) by mouth at bedtime., Disp: 90 tablet, Rfl: 3 .  oxybutynin (DITROPAN-XL) 10 MG 24 hr tablet, Take  1 tablet (10 mg total) by mouth at bedtime., Disp: 90 tablet, Rfl: 2 .  pantoprazole (PROTONIX) 40 MG tablet, Take 1 tablet (40 mg total) by mouth daily., Disp: 90 tablet, Rfl: 3 .  rosuvastatin (CRESTOR) 20 MG tablet, Take 1 tablet (20 mg total) by mouth daily., Disp: , Rfl:  .  traZODone (DESYREL) 50 MG tablet, Take 50 mg by mouth at bedtime., Disp: , Rfl:  .  triamcinolone (KENALOG) 0.1 % paste, Use as directed 1 application in the mouth or throat 2 (two) times daily. (Patient not taking: Reported on 08/16/2019), Disp: 5 g, Rfl: 12   Allergies  Allergen Reactions  . Amoxicillin-Pot Clavulanate Swelling and Rash    Past Medical History:  Diagnosis Date  . CHF (congestive heart failure) ([place])   . Chronic back pain   . Complete tear of right rotator cuff 06/02/2016  . Crohn's disease ([place])   . GERD (gastroesophageal reflux disease)   . High cholesterol   . History of diabetes mellitus, type II   . History of echocardiogram    Echo 12/16: EF 60-65%, no RWMA, Gr 2 DD, mild AS (mean 13 mmHg), mild AI, MAC, trivial MS, mild MR, mild LAE, mild RVE, mild to mod TR, PASP 36 mmHg  . Hypertension   . Migraine    "1-2 times/yr" (02/16/2014)  . Murmur, cardiac   . OSA on CPAP   . Osteopenia 08/16/2019   T=-1.8 dexa 2019  .  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 Pulmonary hypertension ([place])   . Sinus bradycardia 01/29/2014  . Skin cancer 2003   "3 cut off face" (02/16/2014)     Past Surgical History:  Procedure Laterality Date  . CARDIAC CATHETERIZATION  1995; 2005  . CATARACT EXTRACTION Bilateral 1982  . ESOPHAGOGASTRODUODENOSCOPY (EGD) WITH PROPOFOL N/A 12/28/2016   Procedure: ESOPHAGOGASTRODUODENOSCOPY (EGD) WITH PROPOFOL;  Surgeon: Otis Brace, MD;  Location: [facility] [facility];  Service: Gastroenterology;  Laterality: N/A;  . INSERT / REPLACE / REMOVE PACEMAKER  02/14/2014  . PERMANENT PACEMAKER INSERTION N/A 02/14/2014   Procedure: PERMANENT PACEMAKER INSERTION;  Surgeon: Deboraha Sprang, MD;   Location: [facility] CATH LAB;  Service: Cardiovascular;  Laterality: N/A;  . SKIN CANCER EXCISION  2003    "face X 3" (02/16/2014)  . TEMPORARY PACEMAKER INSERTION N/A 02/12/2014   Procedure: TEMPORARY PACEMAKER INSERTION;  Surgeon: Lorretta Harp, MD;  Location: [facility] CATH LAB;  Service: Cardiovascular;  Laterality: N/A;  . VAGINAL HYSTERECTOMY  1972    Family History  Adopted: Yes  Problem Relation Age of Onset  . Hypertension Mother   . Heart disease Mother   . Breast cancer Sister   . Breast cancer Sister     Social History   Tobacco Use  . Smoking status: Never Smoker  . Smokeless tobacco: Never Used  Substance Use Topics  . Alcohol use: No  . Drug use: No    ROS   Objective:   Vitals: BP 107/77 (BP Location: Right Arm)   Pulse 74   Temp 98.8 F (37.1 C)   Resp 18   Wt 180 lb (81.6 kg)   LMP  (LMP Unknown)   SpO2 100%   BMI 32.92 kg/m   Physical Exam Constitutional:      General: She is in acute distress.     Appearance: Normal appearance. She is well-developed. She is not ill-appearing, toxic-appearing or diaphoretic.  HENT:     Head: Normocephalic and atraumatic.     Nose: Nose normal.     Mouth/Throat:     Mouth: Mucous membranes are moist.     Pharynx: Oropharynx is clear.  Eyes:     General: No scleral icterus.    Extraocular Movements: Extraocular movements intact.     Pupils: Pupils are equal, round, and reactive to light.  Cardiovascular:     Rate and Rhythm: Normal rate.  Pulmonary:     Effort: Pulmonary effort is normal.  Musculoskeletal:     Lumbar back: Tenderness (over area outlined) present. No swelling, edema, deformity, signs of trauma, lacerations, spasms or bony tenderness. Decreased range of motion. Positive right straight leg raise test and positive left straight leg raise test. No scoliosis.       Back:  Skin:    General: Skin is warm and dry.  Neurological:     General: No focal deficit present.     Mental Status: She is alert and  oriented to person, place, and time.     Motor: Weakness (secondary to her pain) present.     Coordination: Coordination abnormal.     Gait: Gait abnormal.  Psychiatric:        Mood and Affect: Mood normal.        Behavior: Behavior normal.        Thought Content: Thought content normal.        Judgment: Judgment normal.      Assessment and Plan :   1. Spinal stenosis of lumbar region, unspecified whether  neurogenic claudication present   2. Chronic bilateral low back pain, unspecified whether sciatica present   3. Lumbar radiculopathy   4. Stage 3 chronic kidney disease, unspecified whether stage 3a or 3b CKD     At this time I do not suspect that patient warrants an ER visit.  Suspect that she strained her back from her dog jumping on her lap but is doing very poorly due to her spinal stenosis.  IM solumedrol in clinic. Hydrocodone for severe pain. Okay to use APAP.  Follow-up with orthopedist ASAP.  Strict ER precautions. Counseled patient on potential for adverse effects with medications prescribed today, patient verbalized understanding.    Jaynee Eagles, [place] 10/19/19 1712

## 2019-11-01 ENCOUNTER — Telehealth: Payer: Self-pay | Admitting: Family Medicine

## 2019-11-01 NOTE — Telephone Encounter (Signed)
Left message for patient to call back and schedule Medicare Annual Wellness Visit (AWV) either virtually/audio only OR in office. Whatever the patients preference is.  No hx per [facility]; please schedule at anytime with [facility]-Nurse Health Advisor at [facility].

## 2019-11-10 ENCOUNTER — Other Ambulatory Visit: Payer: Self-pay | Admitting: Family Medicine

## 2019-11-10 DIAGNOSIS — R059 Cough, unspecified: Secondary | ICD-10-CM

## 2019-11-10 DIAGNOSIS — R05 Cough: Secondary | ICD-10-CM

## 2019-11-29 ENCOUNTER — Ambulatory Visit (INDEPENDENT_AMBULATORY_CARE_PROVIDER_SITE_OTHER): Payer: Medicare Other | Admitting: *Deleted

## 2019-11-29 DIAGNOSIS — I495 Sick sinus syndrome: Secondary | ICD-10-CM | POA: Diagnosis not present

## 2019-11-29 LAB — CUP PACEART REMOTE DEVICE CHECK
Battery Remaining Longevity: 46 mo
Battery Voltage: 2.98 V
Brady Statistic AP VP Percent: 0.03 %
Brady Statistic AP VS Percent: 96.54 %
Brady Statistic AS VP Percent: 0 %
Brady Statistic AS VS Percent: 3.42 %
Brady Statistic RA Percent Paced: 96.4 %
Brady Statistic RV Percent Paced: 0.04 %
Date Time Interrogation Session: 20210512110940
Implantable Lead Implant Date: 20150729
Implantable Lead Implant Date: 20150729
Implantable Lead Location: 753859
Implantable Lead Location: 753860
Implantable Lead Model: 5076
Implantable Lead Model: 5076
Implantable Pulse Generator Implant Date: 20150729
Lead Channel Impedance Value: 323 Ohm
Lead Channel Impedance Value: 456 Ohm
Lead Channel Impedance Value: 494 Ohm
Lead Channel Impedance Value: 532 Ohm
Lead Channel Pacing Threshold Amplitude: 0.875 V
Lead Channel Pacing Threshold Amplitude: 1 V
Lead Channel Pacing Threshold Pulse Width: 0.4 ms
Lead Channel Pacing Threshold Pulse Width: 0.4 ms
Lead Channel Sensing Intrinsic Amplitude: 1.25 mV
Lead Channel Sensing Intrinsic Amplitude: 1.25 mV
Lead Channel Sensing Intrinsic Amplitude: 10.375 mV
Lead Channel Sensing Intrinsic Amplitude: 10.375 mV
Lead Channel Setting Pacing Amplitude: 2 V
Lead Channel Setting Pacing Amplitude: 2 V
Lead Channel Setting Pacing Pulse Width: 0.4 ms
Lead Channel Setting Sensing Sensitivity: 2 mV

## 2019-11-30 NOTE — Progress Notes (Signed)
Remote pacemaker transmission.   

## 2019-12-08 ENCOUNTER — Ambulatory Visit: Payer: Medicare Other | Admitting: Family Medicine

## 2020-02-28 ENCOUNTER — Ambulatory Visit (INDEPENDENT_AMBULATORY_CARE_PROVIDER_SITE_OTHER): Payer: Medicare Other | Admitting: *Deleted

## 2020-02-28 DIAGNOSIS — I495 Sick sinus syndrome: Secondary | ICD-10-CM | POA: Diagnosis not present

## 2020-02-28 LAB — CUP PACEART REMOTE DEVICE CHECK
Battery Remaining Longevity: 44 mo
Battery Voltage: 2.98 V
Brady Statistic AP VP Percent: 0.04 %
Brady Statistic AP VS Percent: 96.56 %
Brady Statistic AS VP Percent: 0 %
Brady Statistic AS VS Percent: 3.4 %
Brady Statistic RA Percent Paced: 96.39 %
Brady Statistic RV Percent Paced: 0.04 %
Date Time Interrogation Session: 20210810104836
Implantable Lead Implant Date: 20150729
Implantable Lead Implant Date: 20150729
Implantable Lead Location: 753859
Implantable Lead Location: 753860
Implantable Lead Model: 5076
Implantable Lead Model: 5076
Implantable Pulse Generator Implant Date: 20150729
Lead Channel Impedance Value: 342 Ohm
Lead Channel Impedance Value: 494 Ohm
Lead Channel Impedance Value: 494 Ohm
Lead Channel Impedance Value: 551 Ohm
Lead Channel Pacing Threshold Amplitude: 0.75 V
Lead Channel Pacing Threshold Amplitude: 0.875 V
Lead Channel Pacing Threshold Pulse Width: 0.4 ms
Lead Channel Pacing Threshold Pulse Width: 0.4 ms
Lead Channel Sensing Intrinsic Amplitude: 1.625 mV
Lead Channel Sensing Intrinsic Amplitude: 1.625 mV
Lead Channel Sensing Intrinsic Amplitude: 11.75 mV
Lead Channel Sensing Intrinsic Amplitude: 11.75 mV
Lead Channel Setting Pacing Amplitude: 1.75 V
Lead Channel Setting Pacing Amplitude: 2 V
Lead Channel Setting Pacing Pulse Width: 0.4 ms
Lead Channel Setting Sensing Sensitivity: 2 mV

## 2020-03-01 NOTE — Progress Notes (Signed)
Remote pacemaker transmission.   

## 2020-03-22 ENCOUNTER — Encounter: Payer: Medicare Other | Admitting: Internal Medicine

## 2020-04-03 NOTE — Progress Notes (Signed)
Electrophysiology Office Note Date: 04/04/2020  ID:  Heather [name], Heather [name] [birth year], MRN [id number]  PCP: Heather Rinne, MD Primary Cardiologist: Heather [name] Electrophysiologist: Heather [name]  CC: Pacemaker follow-up  [name] is a 79 y.o. female seen today for Dr Heather [name].  She presents today for routine electrophysiology followup.  Since last being seen in our clinic, the patient reports doing relatively well.  She is being seen in the pain clinic.  She also asks about Lasix dosing today. She is seen with her daughter who helps with history.   She denies chest pain, palpitations, dyspnea (above baseline), PND, orthopnea, nausea, vomiting, dizziness, syncope, edema, weight gain, or early satiety.  Device History: MDT dual chamber PPM implanted 2015 for SSS   Past Medical History:  Diagnosis Date   CHF (congestive heart failure) (HCC)    Chronic back pain    Complete tear of right rotator cuff 06/02/2016   Crohn's disease ([place])    GERD (gastroesophageal reflux disease)    High cholesterol    History of diabetes mellitus, type II    History of echocardiogram    Echo 12/16: EF 60-65%, no RWMA, Gr 2 DD, mild AS (mean 13 mmHg), mild AI, MAC, trivial MS, mild MR, mild LAE, mild RVE, mild to mod TR, PASP 36 mmHg   Hypertension    Migraine    "1-2 times/yr" (02/16/2014)   Murmur, cardiac    OSA on CPAP    Osteopenia 08/16/2019   T=-1.8 dexa 2019   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   Pulmonary hypertension ([place])    Sinus bradycardia 01/29/2014   Skin cancer 2003   "3 cut off face" (02/16/2014)   Past Surgical History:  Procedure Laterality Date   [place]; 2005   CATARACT EXTRACTION Bilateral 1982   ESOPHAGOGASTRODUODENOSCOPY (EGD) WITH PROPOFOL N/A 12/28/2016   Procedure: ESOPHAGOGASTRODUODENOSCOPY (EGD) WITH PROPOFOL;  Surgeon: Otis Brace, MD;  Location: [facility] ENDOSCOPY;  Service: Gastroenterology;  Laterality: N/A;    INSERT / REPLACE / REMOVE PACEMAKER  02/14/2014   PERMANENT PACEMAKER INSERTION N/A 02/14/2014   Procedure: PERMANENT PACEMAKER INSERTION;  Surgeon: Deboraha Sprang, MD;  [facility] CATH LAB;  Service: Cardiovascular;  Laterality: N/A;   SKIN CANCER EXCISION  2003    "face X 3" (02/16/2014)   TEMPORARY PACEMAKER INSERTION N/A 02/12/2014   Procedure: TEMPORARY PACEMAKER INSERTION;  Surgeon: Lorretta Harp, MD;  Location: [facility] CATH LAB;  Service: Cardiovascular;  Laterality: N/A;   VAGINAL HYSTERECTOMY  1972    Current Outpatient Medications  Medication Sig Dispense Refill   amLODipine (NORVASC) 5 MG tablet Take 1 tablet by mouth once daily 90 tablet 0   aspirin 81 MG EC tablet Take 1 tablet (81 mg total) by mouth daily. 30 tablet 5   budesonide-formoterol (SYMBICORT) 160-4.5 MCG/ACT inhaler Inhale 2 puffs into the lungs 2 (two) times daily. 1 Inhaler 3   buprenorphine (BUTRANS) 15 MCG/HR Place 1 patch onto the skin once a week.     carvedilol (COREG) 6.25 MG tablet TAKE 1 TABLET BY MOUTH TWICE DAILY WITH A MEAL 180 tablet 3   FLUoxetine (PROZAC) 10 MG capsule Take 1 capsule (10 mg total) by mouth daily. 90 capsule 3   HYDROcodone-acetaminophen (NORCO/VICODIN) 5-325 MG tablet Take 1 tablet by mouth every 8 (eight) hours as needed for severe pain. 5 tablet 0   losartan-hydrochlorothiazide (HYZAAR) 50-12.5 MG tablet Take 1 tablet by mouth daily. 90 tablet 3   montelukast (SINGULAIR) 10 MG  tablet Take 1 tablet (10 mg total) by mouth at bedtime. 90 tablet 3   oxybutynin (DITROPAN-XL) 10 MG 24 hr tablet Take 1 tablet (10 mg total) by mouth at bedtime. 90 tablet 2   pantoprazole (PROTONIX) 40 MG tablet Take 1 tablet (40 mg total) by mouth daily. 90 tablet 3   pregabalin (LYRICA) 50 MG capsule Take 50 mg by mouth 3 (three) times daily.     rosuvastatin (CRESTOR) 20 MG tablet Take 1 tablet (20 mg total) by mouth daily.     traZODone (DESYREL) 50 MG tablet Take 50 mg by mouth at bedtime.      triamcinolone (KENALOG) 0.1 % paste Use as directed 1 application in the mouth or throat 2 (two) times daily. 5 g 12   furosemide (LASIX) 20 MG tablet Take 1 tablet (20 mg total) by mouth every other day. DOSE CHANGE 45 tablet 3   No current facility-administered medications for this visit.    Allergies:   Amoxicillin-pot clavulanate   Social History: Social History   Socioeconomic History   Marital status: Widowed    Spouse name: Not on file   Number of children: 5   Years of education: middle school   Highest education level: Not on file  Occupational History   Not on file  Tobacco Use   Smoking status: Never Smoker   Smokeless tobacco: Never Used  Vaping Use   Vaping Use: Never used  Substance and Sexual Activity   Alcohol use: No   Drug use: No   Sexual activity: Never  Other Topics Concern   Not on file  Social History Narrative   Moved here from [place] in October 2013.   Right handed   1 cup of caffeine daily   Social Determinants of Health   Financial Resource Strain:    Difficulty of Paying Living Expenses: Not on file  Food Insecurity:    Worried About [place] in the Last Year: Not on file   Ran Out of Food in the Last Year: Not on file  Transportation Needs:    Lack of Transportation (Medical): Not on file   Lack of Transportation (Non-Medical): Not on file  Physical Activity:    Days of Exercise per Week: Not on file   Minutes of Exercise per Session: Not on file  Stress:    Feeling of Stress : Not on file  Social Connections:    Frequency of Communication with Friends and Family: Not on file   Frequency of Social Gatherings with Friends and Family: Not on file   Attends Religious Services: Not on file   Active Member of Clubs or Organizations: Not on file   Attends Archivist Meetings: Not on file   Marital Status: Not on file  Intimate Partner Violence:    Fear of Current or Ex-Partner:  Not on file   Emotionally Abused: Not on file   Physically Abused: Not on file   Sexually Abused: Not on file    Family History: Family History  Adopted: Yes  Problem Relation Age of Onset   Hypertension Mother    Heart disease Mother    Breast cancer Sister    Breast cancer Sister      Review of Systems: All other systems reviewed and are otherwise negative except as noted above.   Physical Exam: VS:  BP 118/74    Pulse 74    Ht _0  (1.575 m)    Wt 170 lb (  77.1 kg)    LMP  (LMP Unknown)    SpO2 97%    BMI 31.09 kg/m  , BMI Body mass index is 31.09 kg/m.  GEN- The patient is well appearing, alert and oriented x 3 today.   HEENT: normocephalic, atraumatic; sclera clear, conjunctiva pink; hearing intact; oropharynx clear; neck supple  Lungs- Clear to ausculation bilaterally, normal work of breathing.  No wheezes, rales, rhonchi Heart- Regular rate and rhythm  GI- soft, non-tender, non-distended, bowel sounds present  Extremities- no clubbing, cyanosis, or edema  MS- no significant deformity or atrophy Skin- warm and dry, no rash or lesion; PPM pocket well healed Psych- euthymic mood, full affect Neuro- strength and sensation are intact  PPM Interrogation- reviewed in detail today,  See PACEART report  EKG:  EKG is not ordered today.  Recent Labs: 08/16/2019: ALT 21; BUN 26; Creatinine, Ser 1.32; Potassium 4.3; Sodium 140   Wt Readings from Last 3 Encounters:  04/04/20 170 lb (77.1 kg)  10/19/19 180 lb (81.6 kg)  08/16/19 177 lb 12.8 oz (80.6 kg)     Other studies Reviewed: Additional studies/ records that were reviewed today include: Dr Heather [name] and Dr Olin Pia office notes   Assessment and Plan:  1.  Symptomatic bradycardia Normal PPM function See Pace Art report No changes today  2.  HTN Stable No change required today  3.  Chronic diastolic heart failure Will try Lasix 68m every other day for consistency  4.  Pain management Her daughter has  several questions about pain management treatment I have encouraged her to reach out to pain management clinic      Current medicines are reviewed at length with the patient today.   The patient does not have concerns regarding her medicines.  The following changes were made today:  none  Labs/ tests ordered today include: none Orders Placed This Encounter  Procedures   Basic Metabolic Panel (BMET)     Disposition:   Follow up with [facility], Dr KCaryl Comes1 year     Signed, AChanetta Marshall NP 04/04/2020 11:53 AM  [id number] [phone]) ([phone](fax)

## 2020-04-04 ENCOUNTER — Encounter: Payer: Self-pay | Admitting: Nurse Practitioner

## 2020-04-04 ENCOUNTER — Other Ambulatory Visit: Payer: Self-pay

## 2020-04-04 ENCOUNTER — Ambulatory Visit (INDEPENDENT_AMBULATORY_CARE_PROVIDER_SITE_OTHER): Payer: Medicare Other | Admitting: Nurse Practitioner

## 2020-04-04 VITALS — BP 118/74 | HR 74 | Ht 62.0 in | Wt 170.0 lb

## 2020-04-04 DIAGNOSIS — I5032 Chronic diastolic (congestive) heart failure: Secondary | ICD-10-CM

## 2020-04-04 DIAGNOSIS — I10 Essential (primary) hypertension: Secondary | ICD-10-CM

## 2020-04-04 DIAGNOSIS — I495 Sick sinus syndrome: Secondary | ICD-10-CM | POA: Diagnosis not present

## 2020-04-04 LAB — BASIC METABOLIC PANEL
BUN/Creatinine Ratio: 17 (ref 12–28)
BUN: 23 mg/dL (ref 8–27)
CO2: 25 mmol/L (ref 20–29)
Calcium: 9.5 mg/dL (ref 8.7–10.3)
Chloride: 100 mmol/L (ref 96–106)
Creatinine, Ser: 1.32 mg/dL — ABNORMAL HIGH (ref 0.57–1.00)
GFR calc Af Amer: 44 mL/min/{1.73_m2} — ABNORMAL LOW (ref >59)
GFR calc non Af Amer: 38 mL/min/{1.73_m2} — ABNORMAL LOW (ref >59)
Glucose: 87 mg/dL (ref 65–99)
Potassium: 4 mmol/L (ref 3.5–5.2)
Sodium: 138 mmol/L (ref 134–144)

## 2020-04-04 MED ORDER — FUROSEMIDE 20 MG PO TABS: 20.0000 mg | ORAL_TABLET | ORAL | 3 refills | Status: DC

## 2020-04-04 NOTE — Patient Instructions (Addendum)
Medication Instructions:  Your physician has recommended you make the following change in your medication:  -- START Furosemide (lasix) 20 mg - Take 1 tablet (20 mg) by mouth every other day -- NEW RX SENT *If you need a refill on your cardiac medications before your next appointment, please call your pharmacy*  Labs: Your physician has recommended that you have lab work today: BMET  Follow-Up: At [facility], you and your health needs are our priority.  As part of our continuing mission to provide you with exceptional heart care, we have created designated Provider Care Teams.  These Care Teams include your primary Cardiologist (physician) and Advanced Practice Providers (APPs -  Physician Assistants and Nurse Practitioners) who all work together to provide you with the care you need, when you need it.  We recommend signing up for the patient portal called "MyChart".  Sign up information is provided on this After Visit Summary.  MyChart is used to connect with patients for Virtual Visits (Telemedicine).  Patients are able to view lab/test results, encounter notes, upcoming appointments, etc.  Non-urgent messages can be sent to your provider as well.   To learn more about what you can do with MyChart, go to [email or web address].    Your next appointment:   Your physician wants you to follow-up in: 1 YEAR with Dr. Caryl Comes.  You will receive a reminder letter in the mail two months in advance. If you don't receive a letter, please call our office to schedule the follow-up appointment.  Remote monitoring is used to monitor your Pacemaker from home. This monitoring reduces the number of office visits required to check your device to one time per year. It allows [place] to keep an eye on the functioning of your device to ensure it is working properly. You are scheduled for a device check from home on 05/29/20. You may send your transmission at any time that day. If you have a wireless device, the  transmission will be sent automatically. After your physician reviews your transmission, you will receive a postcard with your next transmission date.

## 2020-04-08 ENCOUNTER — Telehealth: Payer: Self-pay

## 2020-04-08 NOTE — Telephone Encounter (Signed)
Per DPR spoke with Junious Dresser pt's daughter. Pt's daughter is aware and agreeable to stable labs

## 2020-04-08 NOTE — Telephone Encounter (Signed)
-----   Message from Patsey Berthold, NP sent at 04/05/2020  6:38 AM EDT ----- Please notify patient of stable labs. Thanks!

## 2020-05-17 ENCOUNTER — Other Ambulatory Visit: Payer: Self-pay | Admitting: *Deleted

## 2020-05-17 ENCOUNTER — Telehealth: Payer: Self-pay | Admitting: Cardiology

## 2020-05-17 MED ORDER — CARVEDILOL 6.25 MG PO TABS
6.2500 mg | ORAL_TABLET | Freq: Two times a day (BID) | ORAL | 0 refills | Status: DC
Start: 2020-05-17 — End: 2020-07-03

## 2020-05-17 NOTE — Telephone Encounter (Signed)
Pt. Came into the office requesting a refill on her medicine carvedilol. She says she took her last one today

## 2020-06-18 ENCOUNTER — Other Ambulatory Visit: Payer: Self-pay

## 2020-06-18 ENCOUNTER — Emergency Department (HOSPITAL_COMMUNITY): Payer: Medicare Other

## 2020-06-18 ENCOUNTER — Encounter (HOSPITAL_COMMUNITY): Payer: Self-pay | Admitting: Emergency Medicine

## 2020-06-18 ENCOUNTER — Ambulatory Visit (HOSPITAL_COMMUNITY)
Admission: EM | Admit: 2020-06-18 | Discharge: 2020-06-18 | Disposition: A | Discharge status: Home / Self Care | Payer: Medicare Other

## 2020-06-18 ENCOUNTER — Emergency Department (HOSPITAL_COMMUNITY)
Admission: EM | Admit: 2020-06-18 | Discharge: 2020-06-18 | Disposition: A | Discharge status: Home / Self Care | Payer: Medicare Other | Attending: Emergency Medicine | Admitting: Emergency Medicine

## 2020-06-18 DIAGNOSIS — Z7982 Long term (current) use of aspirin: Secondary | ICD-10-CM | POA: Insufficient documentation

## 2020-06-18 DIAGNOSIS — I509 Heart failure, unspecified: Secondary | ICD-10-CM | POA: Diagnosis not present

## 2020-06-18 DIAGNOSIS — N184 Chronic kidney disease, stage 4 (severe): Secondary | ICD-10-CM | POA: Diagnosis not present

## 2020-06-18 DIAGNOSIS — R42 Dizziness and giddiness: Secondary | ICD-10-CM | POA: Insufficient documentation

## 2020-06-18 DIAGNOSIS — I130 Hypertensive heart and chronic kidney disease with heart failure and stage 1 through stage 4 chronic kidney disease, or unspecified chronic kidney disease: Secondary | ICD-10-CM | POA: Diagnosis not present

## 2020-06-18 DIAGNOSIS — Z79899 Other long term (current) drug therapy: Secondary | ICD-10-CM | POA: Insufficient documentation

## 2020-06-18 LAB — CBC
HCT: 42.2 % (ref 36.0–46.0)
Hemoglobin: 13 g/dL (ref 12.0–15.0)
MCH: 27.9 pg (ref 26.0–34.0)
MCHC: 30.8 g/dL (ref 30.0–36.0)
MCV: 90.6 fL (ref 80.0–100.0)
Platelets: 242 10*3/uL (ref 150–400)
RBC: 4.66 MIL/uL (ref 3.87–5.11)
RDW: 15.2 % (ref 11.5–15.5)
WBC: 6.9 10*3/uL (ref 4.0–10.5)
nRBC: 0 % (ref 0.0–0.2)

## 2020-06-18 LAB — BASIC METABOLIC PANEL
Anion gap: 11 (ref 5–15)
BUN: 37 mg/dL — ABNORMAL HIGH (ref 8–23)
CO2: 27 mmol/L (ref 22–32)
Calcium: 10 mg/dL (ref 8.9–10.3)
Chloride: 102 mmol/L (ref 98–111)
Creatinine, Ser: 1.41 mg/dL — ABNORMAL HIGH (ref 0.44–1.00)
GFR, Estimated: 38 mL/min — ABNORMAL LOW (ref >60)
Glucose, Bld: 99 mg/dL (ref 70–99)
Potassium: 3.9 mmol/L (ref 3.5–5.1)
Sodium: 140 mmol/L (ref 135–145)

## 2020-06-18 LAB — URINALYSIS, ROUTINE W REFLEX MICROSCOPIC
Bilirubin Urine: NEGATIVE
Glucose, UA: NEGATIVE mg/dL
Hgb urine dipstick: NEGATIVE
Ketones, ur: NEGATIVE mg/dL
Leukocytes,Ua: NEGATIVE
Nitrite: NEGATIVE
Protein, ur: NEGATIVE mg/dL
Specific Gravity, Urine: 1.009 (ref 1.005–1.030)
pH: 5 (ref 5.0–8.0)

## 2020-06-18 LAB — CBG MONITORING, ED: Glucose-Capillary: 89 mg/dL (ref 70–99)

## 2020-06-18 NOTE — ED Provider Notes (Signed)
[facility] EMERGENCY DEPARTMENT Provider Note   CSN: [id number] Arrival date & time: 06/18/20  1010     History Chief Complaint  Patient presents with  . Dizziness    Heather [name] is a 79 y.o. female.  HPI   Pt states she fell 5 days ago.  A couple of weeks ago pt startede to noticed that her balance was off.  She is going side to side when she walks and she feels unsteady.  It has gotten worse over the last couple of weeks.  TOday she even had trouble just walking.  Pt was told that in the past her potassium was too high. No trrouble with speech.  No trouble with weakness.  No fevers or chills.  Vaccinated for covid times two.  Pt is using lyrica for pain patch.  She is also using trazadone for speech.  Past Medical History:  Diagnosis Date  . CHF (congestive heart failure) ([place])   . Chronic back pain   . Complete tear of right rotator cuff 06/02/2016  . Crohn's disease ([place])   . GERD (gastroesophageal reflux disease)   . High cholesterol   . History of diabetes mellitus, type II   . History of echocardiogram    Echo 12/16: EF 60-65%, no RWMA, Gr 2 DD, mild AS (mean 13 mmHg), mild AI, MAC, trivial MS, mild MR, mild LAE, mild RVE, mild to mod TR, PASP 36 mmHg  . Hypertension   . Migraine    "1-2 times/yr" (02/16/2014)  . Murmur, cardiac   . OSA on CPAP   . Osteopenia 08/16/2019   T=-1.8 dexa 2019  . 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 Pulmonary hypertension ([place])   . Sinus bradycardia 01/29/2014  . Skin cancer 2003   "3 cut off face" (02/16/2014)    Patient Active Problem List   Diagnosis Date Noted  . Upper airway cough syndrome 08/16/2019  . Depression, recurrent ([place]) 08/16/2019  . Osteopenia 08/16/2019  . Obesity (BMI 30.0-34.9) 02/01/2019  . Hyperlipidemia associated with type 2 diabetes mellitus ([place]) 02/01/2019  . Primary osteoarthritis of right shoulder 05/17/2018  . Sick sinus syndrome ([place]) 10/07/2017  . Mild cognitive  impairment 09/29/2017  . Osteoarthritis of cervical spine 06/04/2017  . Controlled type 2 diabetes mellitus with chronic kidney disease, without long-term current use of insulin ([place]) 04/27/2017  . Neurogenic claudication due to lumbar spinal stenosis 05/06/2016  . CKD (chronic kidney disease) stage 4, GFR 15-29 ml/min (HCC) 05/06/2015  . 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 OSA (obstructive sleep apnea) 01/29/2014  . Pulmonary hypertension ([place]) 01/29/2014  . CAD (coronary artery disease) 01/29/2014  . Hypertension associated with diabetes ([place]) 01/29/2014  . Diastolic CHF ([place]) 26/37/8588    Past Surgical History:  Procedure Laterality Date  . CARDIAC CATHETERIZATION  1995; 2005  . CATARACT EXTRACTION Bilateral 1982  . ESOPHAGOGASTRODUODENOSCOPY (EGD) WITH PROPOFOL N/A 12/28/2016   Procedure: ESOPHAGOGASTRODUODENOSCOPY (EGD) WITH PROPOFOL;  Surgeon: Otis Brace, MD;  Location: [facility];  Service: Gastroenterology;  Laterality: N/A;  . INSERT / REPLACE / REMOVE PACEMAKER  02/14/2014  . PERMANENT PACEMAKER INSERTION N/A 02/14/2014   Procedure: PERMANENT PACEMAKER INSERTION;  Surgeon: Deboraha Sprang, MD;  Location: [facility] CATH LAB;  Service: Cardiovascular;  Laterality: N/A;  . SKIN CANCER EXCISION  2003    "face X 3" (02/16/2014)  . TEMPORARY PACEMAKER INSERTION N/A 02/12/2014   Procedure: TEMPORARY PACEMAKER INSERTION;  Surgeon: Lorretta Harp, MD;  Location: [facility] CATH LAB;  Service: Cardiovascular;  Laterality: N/A;  . VAGINAL HYSTERECTOMY  1972     OB History   No obstetric history on file.     Family History  Adopted: Yes  Problem Relation Age of Onset  . Hypertension Mother   . Heart disease Mother   . Breast cancer Sister   . Breast cancer Sister     Social History   Tobacco Use  . Smoking status: Never Smoker  . Smokeless tobacco: Never Used  Vaping Use  . Vaping Use: Never used  Substance Use Topics  . Alcohol use: No  . Drug use: No    Home  Medications Prior to Admission medications   Medication Sig Start Date End Date Taking? Authorizing Provider  amLODipine (NORVASC) 5 MG tablet Take 1 tablet by mouth once daily 11/10/19  Yes Leamon Arnt, MD  aspirin 81 MG EC tablet Take 1 tablet (81 mg total) by mouth daily. 05/08/15  Yes Funches, Adriana Mccallum, MD  budesonide-formoterol (SYMBICORT) 160-4.5 MCG/ACT inhaler Inhale 2 puffs into the lungs 2 (two) times daily. 05/08/19  Yes Briscoe Deutscher, DO  buprenorphine Haze Rushing) 15 MCG/HR Place 1 patch onto the skin every Monday.    Yes [provider]  Camphor-Eucalyptus-Menthol (VICKS VAPORUB EX) Apply 1 application topically daily.   Yes [provider]  carvedilol (COREG) 6.25 MG tablet Take 1 tablet (6.25 mg total) by mouth 2 (two) times daily with a meal. PATIENT NEEDS TO SCHEDULE AN APPOINTMENT WITH DR Marlou Porch FOR FURTHER REFILLS 1ST ATTEMPT 05/17/20  Yes Jerline Pain, MD  FLUoxetine (PROZAC) 10 MG capsule Take 1 capsule (10 mg total) by mouth daily. 02/01/19  Yes Briscoe Deutscher, DO  fluticasone (FLONASE) 50 MCG/ACT nasal spray Place 1 spray into both nostrils daily.  04/16/20  Yes [provider]  losartan-hydrochlorothiazide (HYZAAR) 50-12.5 MG tablet Take 1 tablet by mouth daily. 08/16/19  Yes Leamon Arnt, MD  montelukast (SINGULAIR) 10 MG tablet Take 1 tablet (10 mg total) by mouth at bedtime. 09/30/18  Yes Briscoe Deutscher, DO  OVER THE COUNTER MEDICATION Take 2 sprays by mouth daily as needed (dry mouth). Biotene dry mouth spray   Yes [provider]  oxybutynin (DITROPAN-XL) 10 MG 24 hr tablet Take 1 tablet (10 mg total) by mouth at bedtime. 05/08/19  Yes Briscoe Deutscher, DO  pantoprazole (PROTONIX) 40 MG tablet Take 1 tablet (40 mg total) by mouth daily. Patient taking differently: Take 40 mg by mouth at bedtime.  02/01/19  Yes Briscoe Deutscher, DO  rosuvastatin (CRESTOR) 20 MG tablet Take 20 mg by mouth at bedtime.  08/16/19  Yes Leamon Arnt, MD   traZODone (DESYREL) 50 MG tablet Take 50 mg by mouth at bedtime.   Yes [provider]  furosemide (LASIX) 20 MG tablet Take 1 tablet (20 mg total) by mouth every other day. DOSE CHANGE Patient not taking: Reported on 06/18/2020 04/04/20 07/03/20  Patsey Berthold, NP  HYDROcodone-acetaminophen (NORCO/VICODIN) 5-325 MG tablet Take 1 tablet by mouth every 8 (eight) hours as needed for severe pain. Patient not taking: Reported on 06/18/2020 10/19/19   Jaynee Eagles, PA-C  pregabalin (LYRICA) 50 MG capsule Take 50 mg by mouth 3 (three) times daily.    [provider]  triamcinolone (KENALOG) 0.1 % paste Use as directed 1 application in the mouth or throat 2 (two) times daily. Patient not taking: Reported on 06/18/2020 05/08/19   Briscoe Deutscher, DO    Allergies    Amoxicillin-pot clavulanate  Review of Systems  Review of Systems  Respiratory: Negative for shortness of breath.   Cardiovascular: Negative for chest pain.  Gastrointestinal:       Appetite not good  Neurological: Positive for dizziness.  All other systems reviewed and are negative.   Physical Exam Updated Vital Signs BP (!) 132/113 (BP Location: Right Arm)   Pulse 75   Temp (!) 97.5 F (36.4 C) (Oral)   Resp 12   Ht 1.5 m (4' 11.06")   Wt 74.4 kg   LMP  (LMP Unknown)   SpO2 99%   BMI 33.06 kg/m   Physical Exam Vitals and nursing note reviewed.  Constitutional:      General: She is not in acute distress.    Appearance: She is well-developed.  HENT:     Head: Normocephalic and atraumatic.     Right Ear: External ear normal.     Left Ear: External ear normal.  Eyes:     General: No scleral icterus.       Right eye: No discharge.        Left eye: No discharge.     Conjunctiva/sclera: Conjunctivae normal.  Neck:     Trachea: No tracheal deviation.  Cardiovascular:     Rate and Rhythm: Normal rate and regular rhythm.  Pulmonary:     Effort: Pulmonary effort is normal. No respiratory distress.      Breath sounds: Normal breath sounds. No stridor. No wheezing or rales.  Abdominal:     General: Bowel sounds are normal. There is no distension.     Palpations: Abdomen is soft.     Tenderness: There is no abdominal tenderness. There is no guarding or rebound.  Musculoskeletal:        General: No tenderness.     Cervical back: Neck supple.  Skin:    General: Skin is warm and dry.     Findings: No rash.  Neurological:     Mental Status: She is alert and oriented to person, place, and time.     Cranial Nerves: No cranial nerve deficit (No facial droop, extraocular movements intact, tongue midline ).     Sensory: No sensory deficit.     Motor: No abnormal muscle tone or seizure activity.     Coordination: Coordination normal.     Comments: No pronator drift bilateral upper extrem, able to hold both legs off bed for 5 seconds, sensation intact in all extremities, no visual field cuts, no left or right sided neglect, finger-nose exam  Slow to perform but no definite pass pointing, no nystagmus noted      ED Results / Procedures / Treatments   Labs (all labs ordered are listed, but only abnormal results are displayed) Labs Reviewed  BASIC METABOLIC PANEL - Abnormal; Notable for the following components:      Result Value   BUN 37 (*)    Creatinine, Ser 1.41 (*)    GFR, Estimated 38 (*)    All other components within normal limits  CBC  URINALYSIS, ROUTINE W REFLEX MICROSCOPIC  CBG MONITORING, ED    EKG EKG Interpretation  Date/Time:  Tuesday June 18 2020 10:21:32 EST Ventricular Rate:  71 PR Interval:  232 QRS Duration: 92 QT Interval:  392 QTC Calculation: 425 R Axis:   14 Text Interpretation: Atrial-paced rhythm with prolonged AV conduction Possible Anterior infarct , age undetermined Abnormal ECG No significant change since last tracing Confirmed by Dorie Rank [phone]) on 06/18/2020 11:19:55 AM Also confirmed by Dorie Rank [phone]),  editor Hattie Perch  [phone])  on 06/18/2020 11:21:27 AM   Radiology CT Head Wo Contrast  Result Date: 06/18/2020 CLINICAL DATA:  Neuro deficit, acute, stroke suspected. Additional history provided: Dizziness, unsteady gait for 2 weeks, fall last Friday, body aches. EXAM: CT HEAD WITHOUT CONTRAST TECHNIQUE: Contiguous axial images were obtained from the base of the skull through the vertex without intravenous contrast. COMPARISON:  Head CT 04/26/2017. FINDINGS: Brain: The inferior-most aspect of the right posterior fossa is excluded from the field of view. Cerebral volume is normal for age. Nonspecific 3 mm extra-axial calcification along the paramedian high left frontoparietal lobes, unchanged as compared to the head CT of 04/26/2017. There is no acute intracranial hemorrhage. No demarcated cortical infarct. No extra-axial fluid collection. No evidence of intracranial mass. No midline shift. Vascular: No hyperdense vessel.  Atherosclerotic calcifications Skull: Normal. Negative for fracture or focal lesion. Sinuses/Orbits: Visualized orbits show no acute finding. Mild ethmoid sinus mucosal thickening. IMPRESSION: The inferior-most aspect of the right posterior fossa is excluded from the field of view. Within this limitation, there is a stable non-contrast CT appearance of the brain as compared to 04/26/2017. No evidence of acute intracranial abnormality. Mild ethmoid sinus mucosal thickening. Electronically Signed   By: Kellie Simmering DO   On: 06/18/2020 13:13    Procedures Procedures (including critical care time)  Medications Ordered in ED Medications - No data to display  ED Course  I have reviewed the triage vital signs and the nursing notes.  Pertinent labs & imaging results that were available during my care of the patient were reviewed by me and considered in my medical decision making (see chart for details).  Clinical Course as of Jun 18 1525  Tue Jun 18, 2020  1400 Urinalysis without signs of infection    [JK]  1400 Creatinine elevated but at baseline patient has an elevated creatinine   [JK]  1401 CT scan limited but no acute findings   [JK]    Clinical Course User Index [JK] Dorie Rank, MD   MDM Rules/Calculators/A&P                          Patient presented to the ED for issues with dizziness and balance.  Family states patient was seen by her doctor recently and they had her discontinue her Lyrica.  That was just yesterday.  Patient's ED work-up is reassuring.  She has no focal deficits on exam.  No findings to suggest stroke.  Laboratory tests and CT scan are normal.  I suspect her symptoms could be related to her medications.  Patient was able to walk around the ED without difficulties.  We will have her continue to hold the Lyrica.  Was only stopped yesterday and its possibly symptoms may continue to improve.  Warning signs precautions discussed.  Outpatient follow-up with primary doctor. Final Clinical Impression(s) / ED Diagnoses Final diagnoses:  Dizziness    Rx / DC Orders ED Discharge Orders    None       Dorie Rank, MD 06/18/20 1527

## 2020-06-18 NOTE — Discharge Instructions (Addendum)
The blood tests and the CT scan today were reassuring.  Continue to hold the Lyrica medication as your doctor previously recommended.  Follow-up with your doctor in the next week or so to be rechecked if the symptoms have not resolved.  Return as needed for worsening symptoms

## 2020-06-18 NOTE — ED Notes (Signed)
Pt up to bathroom with family. Pt stable and uses walker at home.

## 2020-06-18 NOTE — ED Triage Notes (Addendum)
Pt presents with body aches after she fell 5 days ago. Pt reports feeling out of balance when walking x 1 week. Pt reports last time she felt like this her potassium level was high. Denies chest pain, dizziness, nausea, abdominal pain, vision changes.

## 2020-06-18 NOTE — ED Triage Notes (Signed)
Patient arrives to ED with complaints of dizziness and balance issues x2 weeks. Pt states that this past Friday she fell and now have generalized body aches. Pt denies speech or vision problems. Denies CP or SOB. States hx of hyperkalemia.

## 2020-06-18 NOTE — ED Notes (Signed)
Pt discharged ambulatory. All questions and concerns addressed. No complaints at this time.   

## 2020-07-03 ENCOUNTER — Other Ambulatory Visit: Payer: Self-pay

## 2020-07-03 ENCOUNTER — Ambulatory Visit (INDEPENDENT_AMBULATORY_CARE_PROVIDER_SITE_OTHER): Payer: Medicare Other | Admitting: Cardiology

## 2020-07-03 ENCOUNTER — Encounter: Payer: Self-pay | Admitting: Cardiology

## 2020-07-03 VITALS — BP 120/60 | HR 92 | Ht 59.0 in | Wt 158.0 lb

## 2020-07-03 DIAGNOSIS — Z950 Presence of cardiac pacemaker: Secondary | ICD-10-CM | POA: Diagnosis not present

## 2020-07-03 DIAGNOSIS — I5032 Chronic diastolic (congestive) heart failure: Secondary | ICD-10-CM | POA: Diagnosis not present

## 2020-07-03 DIAGNOSIS — R059 Cough, unspecified: Secondary | ICD-10-CM

## 2020-07-03 DIAGNOSIS — I10 Essential (primary) hypertension: Secondary | ICD-10-CM

## 2020-07-03 MED ORDER — CARVEDILOL 6.25 MG PO TABS: 6.2500 mg | ORAL_TABLET | Freq: Two times a day (BID) | ORAL | 3 refills | Status: AC

## 2020-07-03 MED ORDER — HYDROCHLOROTHIAZIDE 12.5 MG PO CAPS: 12.5000 mg | ORAL_CAPSULE | Freq: Every day | ORAL | 3 refills | Status: AC

## 2020-07-03 MED ORDER — AMLODIPINE BESYLATE 5 MG PO TABS: 5.0000 mg | ORAL_TABLET | Freq: Every day | ORAL | 3 refills | Status: DC

## 2020-07-03 NOTE — Progress Notes (Signed)
Cardiology Office Note:    Date:  07/03/2020   ID:  Heather [name], [name] [birth year], MRN [id number]  PCP:  Patrecia Pour, Christean Grief, MD  [facility] Cardiologist:  Candee Furbish, MD  [facility] Electrophysiologist:  None   Referring MD: Thornton Dales I, MD     History of Present Illness:    Heather [name] is a 79 y.o. female here for follow-up of diastolic heart failure, aortic stenosis, pacemaker.  Former patient of Dr. Sherryl Barters.  Dr. Caryl Comes follows pacemaker.  Medtronic.  Overall been feeling well without any problems no fevers chills nausea vomiting.  LDL 82 HDL 48 hemoglobin 13 creatinine 1.4  Rare swelling in the ankles, lower extremities.  Past Medical History:  Diagnosis Date   CHF (congestive heart failure) (HCC)    Chronic back pain    Complete tear of right rotator cuff 06/02/2016   Crohn's disease ([place])    GERD (gastroesophageal reflux disease)    High cholesterol    History of diabetes mellitus, type II    History of echocardiogram    Echo 12/16: EF 60-65%, no RWMA, Gr 2 DD, mild AS (mean 13 mmHg), mild AI, MAC, trivial MS, mild MR, mild LAE, mild RVE, mild to mod TR, PASP 36 mmHg   Hypertension    Migraine    "1-2 times/yr" (02/16/2014)   Murmur, cardiac    OSA on CPAP    Osteopenia 08/16/2019   T=-1.8 dexa 2019   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   Pulmonary hypertension ([place])    Sinus bradycardia 01/29/2014   Skin cancer 2003   "3 cut off face" (02/16/2014)    Past Surgical History:  Procedure Laterality Date   [place]; 2005   CATARACT EXTRACTION Bilateral 1982   ESOPHAGOGASTRODUODENOSCOPY (EGD) WITH PROPOFOL N/A 12/28/2016   Procedure: ESOPHAGOGASTRODUODENOSCOPY (EGD) WITH PROPOFOL;  Surgeon: Otis Brace, MD;  [facility] END[facility];  Service: Gastroenterology;  Laterality: N/A;   INSERT / REPLACE / REMOVE PACEMAKER  02/14/2014   PERMANENT PACEMAKER INSERTION N/A 02/14/2014    Procedure: PERMANENT PACEMAKER INSERTION;  Surgeon: Deboraha Sprang, MD;  Location: [facility] CATH LAB;  Service: Cardiovascular;  Laterality: N/A;   SKIN CANCER EXCISION  2003    "face X 3" (02/16/2014)   TEMPORARY PACEMAKER INSERTION N/A 02/12/2014   Procedure: TEMPORARY PACEMAKER INSERTION;  Surgeon: Lorretta Harp, MD;  Location: [facility] CATH LAB;  Service: Cardiovascular;  Laterality: N/A;   VAGINAL HYSTERECTOMY  1972    Current Medications: Current Meds  Medication Sig   aspirin 81 MG EC tablet Take 1 tablet (81 mg total) by mouth daily.   budesonide-formoterol (SYMBICORT) 160-4.5 MCG/ACT inhaler Inhale 2 puffs into the lungs 2 (two) times daily.   buprenorphine (BUTRANS) 15 MCG/HR Place 1 patch onto the skin every Monday.    Camphor-Eucalyptus-Menthol (VICKS VAPORUB EX) Apply 1 application topically daily.   FLUoxetine (PROZAC) 10 MG capsule Take 1 capsule (10 mg total) by mouth daily.   fluticasone (FLONASE) 50 MCG/ACT nasal spray Place 1 spray into both nostrils daily.    HYDROcodone-acetaminophen (NORCO/VICODIN) 5-325 MG tablet Take 1 tablet by mouth every 8 (eight) hours as needed for severe pain.   losartan-hydrochlorothiazide (HYZAAR) 50-12.5 MG tablet Take 1 tablet by mouth daily.   montelukast (SINGULAIR) 10 MG tablet Take 1 tablet (10 mg total) by mouth at bedtime.   OVER THE COUNTER MEDICATION Take 2 sprays by mouth daily as needed (dry mouth). Biotene dry mouth spray   oxybutynin (DITROPAN-XL)  10 MG 24 hr tablet Take 1 tablet (10 mg total) by mouth at bedtime.   pantoprazole (PROTONIX) 40 MG tablet Take 1 tablet (40 mg total) by mouth daily.   rosuvastatin (CRESTOR) 20 MG tablet Take 20 mg by mouth at bedtime.    traZODone (DESYREL) 50 MG tablet Take 50 mg by mouth at bedtime.   triamcinolone (KENALOG) 0.1 % paste Use as directed 1 application in the mouth or throat 2 (two) times daily.   [DISCONTINUED] amLODipine (NORVASC) 5 MG tablet Take 1 tablet by mouth once  daily   [DISCONTINUED] carvedilol (COREG) 6.25 MG tablet Take 1 tablet (6.25 mg total) by mouth 2 (two) times daily with a meal. PATIENT NEEDS TO SCHEDULE AN APPOINTMENT WITH DR Marlou Porch FOR FURTHER REFILLS 1ST ATTEMPT   [DISCONTINUED] hydrochlorothiazide (MICROZIDE) 12.5 MG capsule Take 12.5 mg by mouth daily.     Allergies:   Amoxicillin-pot clavulanate   Social History   Socioeconomic History   Marital status: Widowed    Spouse name: Not on file   Number of children: 5   Years of education: middle school   Highest education level: Not on file  Occupational History   Not on file  Tobacco Use   Smoking status: Never Smoker   Smokeless tobacco: Never Used  Vaping Use   Vaping Use: Never used  Substance and Sexual Activity   Alcohol use: No   Drug use: No   Sexual activity: Never  Other Topics Concern   Not on file  Social History Narrative   Moved here from [place] in October 2013.   Right handed   1 cup of caffeine daily   Social Determinants of Health   Financial Resource Strain: Not on file  Food Insecurity: Not on file  Transportation Needs: Not on file  Physical Activity: Not on file  Stress: Not on file  Social Connections: Not on file     Family History: The patient's family history includes Breast cancer in her sister and sister; Heart disease in her mother; Hypertension in her mother. She was adopted.  ROS:   Please see the history of present illness.     All other systems reviewed and are negative.    Recent Labs: 08/16/2019: ALT 21 06/18/2020: BUN 37; Creatinine, Ser 1.41; Hemoglobin 13.0; Platelets 242; Potassium 3.9; Sodium 140  Recent Lipid Panel    Component Value Date/Time   CHOL 149 08/16/2019 1411   TRIG 93.0 08/16/2019 1411   HDL 48.50 08/16/2019 1411   CHOLHDL 3 08/16/2019 1411   VLDL 18.6 08/16/2019 1411   LDLCALC 82 08/16/2019 1411         Physical Exam:    VS:  BP 120/60 (BP Location: Left Arm, Patient Position:  Sitting, Cuff Size: Normal)    Pulse 92    Ht _0  (1.499 m)    Wt 158 lb (71.7 kg)    LMP  (LMP Unknown)    SpO2 97%    BMI 31.91 kg/m     Wt Readings from Last 3 Encounters:  07/03/20 158 lb (71.7 kg)  06/18/20 164 lb (74.4 kg)  04/04/20 170 lb (77.1 kg)     GEN:  Well nourished, well developed in no acute distress HEENT: Normal NECK: No JVD; No carotid bruits LYMPHATICS: No lymphadenopathy CARDIAC: RRR, no murmurs, rubs, gallops RESPIRATORY:  Clear to auscultation without rales, wheezing or rhonchi  ABDOMEN: Soft, non-tender, non-distended MUSCULOSKELETAL:  No edema; No deformity  SKIN: Warm and dry NEUROLOGIC:  Alert and oriented x 3 PSYCHIATRIC:  Normal affect   ASSESSMENT:    1. Chronic diastolic heart failure ([place])   2. Essential hypertension   3. Pacemaker    PLAN:    In order of problems listed above:  Pacemaker -Working well, Dr. Caryl Comes.  Essential hypertension -Very well controlled.  No changes made.  Norvasc, carvedilol, Hyzaar.  Continue with current medical management.  Chronic diastolic heart failure -Euvolemic.  Rarely takes Lasix.  Okay to continue with losartan hydrochlorothiazide combination.  1 year follow-up.  Medication Adjustments/Labs and Tests Ordered: Current medicines are reviewed at length with the patient today.  Concerns regarding medicines are outlined above.  No orders of the defined types were placed in this encounter.  No orders of the defined types were placed in this encounter.   Patient Instructions  Medication Instructions:  The current medical regimen is effective;  continue present plan and medications.  *If you need a refill on your cardiac medications before your next appointment, please call your pharmacy*  Follow-Up: At [facility], you and your health needs are our priority.  As part of our continuing mission to provide you with exceptional heart care, we have created designated Provider Care Teams.  These Care  Teams include your primary Cardiologist (physician) and Advanced Practice Providers (APPs -  Physician Assistants and Nurse Practitioners) who all work together to provide you with the care you need, when you need it.  We recommend signing up for the patient portal called "MyChart".  Sign up information is provided on this After Visit Summary.  MyChart is used to connect with patients for Virtual Visits (Telemedicine).  Patients are able to view lab/test results, encounter notes, upcoming appointments, etc.  Non-urgent messages can be sent to your provider as well.   To learn more about what you can do with MyChart, go to [email or web address].    Your next appointment:   12 month(s)  The format for your next appointment:   In Person  Provider:   Candee Furbish, MD   Thank you for choosing [facility]!!         Signed, Candee Furbish, MD  07/03/2020 11:27 AM    [place]

## 2020-07-03 NOTE — Patient Instructions (Signed)
Medication Instructions:  The current medical regimen is effective;  continue present plan and medications.  *If you need a refill on your cardiac medications before your next appointment, please call your pharmacy*  Follow-Up: At [facility], you and your health needs are our priority.  As part of our continuing mission to provide you with exceptional heart care, we have created designated Provider Care Teams.  These Care Teams include your primary Cardiologist (physician) and Advanced Practice Providers (APPs -  Physician Assistants and Nurse Practitioners) who all work together to provide you with the care you need, when you need it.  We recommend signing up for the patient portal called "MyChart".  Sign up information is provided on this After Visit Summary.  MyChart is used to connect with patients for Virtual Visits (Telemedicine).  Patients are able to view lab/test results, encounter notes, upcoming appointments, etc.  Non-urgent messages can be sent to your provider as well.   To learn more about what you can do with MyChart, go to [email or web address].    Your next appointment:   12 month(s)  The format for your next appointment:   In Person  Provider:   Mark Skains, MD   Thank you for choosing [facility]!!      

## 2020-09-13 ENCOUNTER — Ambulatory Visit (INDEPENDENT_AMBULATORY_CARE_PROVIDER_SITE_OTHER): Payer: Medicare Other

## 2020-09-13 DIAGNOSIS — I495 Sick sinus syndrome: Secondary | ICD-10-CM

## 2020-09-13 LAB — CUP PACEART REMOTE DEVICE CHECK
Battery Remaining Longevity: 35 mo
Battery Voltage: 2.96 V
Brady Statistic AP VP Percent: 0.07 %
Brady Statistic AP VS Percent: 98.93 %
Brady Statistic AS VP Percent: 0 %
Brady Statistic AS VS Percent: 0.99 %
Brady Statistic RA Percent Paced: 98.96 %
Brady Statistic RV Percent Paced: 0.09 %
Date Time Interrogation Session: 20220225142005
Implantable Lead Implant Date: 20150729
Implantable Lead Implant Date: 20150729
Implantable Lead Location: 753859
Implantable Lead Location: 753860
Implantable Lead Model: 5076
Implantable Lead Model: 5076
Implantable Pulse Generator Implant Date: 20150729
Lead Channel Impedance Value: 361 Ohm
Lead Channel Impedance Value: 475 Ohm
Lead Channel Impedance Value: 494 Ohm
Lead Channel Impedance Value: 513 Ohm
Lead Channel Pacing Threshold Amplitude: 0.75 V
Lead Channel Pacing Threshold Amplitude: 1 V
Lead Channel Pacing Threshold Pulse Width: 0.4 ms
Lead Channel Pacing Threshold Pulse Width: 0.4 ms
Lead Channel Sensing Intrinsic Amplitude: 2 mV
Lead Channel Sensing Intrinsic Amplitude: 2 mV
Lead Channel Sensing Intrinsic Amplitude: 9.875 mV
Lead Channel Sensing Intrinsic Amplitude: 9.875 mV
Lead Channel Setting Pacing Amplitude: 2 V
Lead Channel Setting Pacing Amplitude: 2 V
Lead Channel Setting Pacing Pulse Width: 0.4 ms
Lead Channel Setting Sensing Sensitivity: 2 mV

## 2020-09-20 NOTE — Progress Notes (Signed)
Remote pacemaker transmission.   

## 2021-03-14 ENCOUNTER — Ambulatory Visit (INDEPENDENT_AMBULATORY_CARE_PROVIDER_SITE_OTHER): Payer: Medicare HMO

## 2021-03-14 DIAGNOSIS — I495 Sick sinus syndrome: Secondary | ICD-10-CM | POA: Diagnosis not present

## 2021-03-18 LAB — CUP PACEART REMOTE DEVICE CHECK
Battery Remaining Longevity: 35 mo
Battery Voltage: 2.95 V
Brady Statistic AP VP Percent: 0.1 %
Brady Statistic AP VS Percent: 96.61 %
Brady Statistic AS VP Percent: 0.01 %
Brady Statistic AS VS Percent: 3.28 %
Brady Statistic RA Percent Paced: 96.35 %
Brady Statistic RV Percent Paced: 0.13 %
Date Time Interrogation Session: 20220826152745
Implantable Lead Implant Date: 20150729
Implantable Lead Implant Date: 20150729
Implantable Lead Location: 753859
Implantable Lead Location: 753860
Implantable Lead Model: 5076
Implantable Lead Model: 5076
Implantable Pulse Generator Implant Date: 20150729
Lead Channel Impedance Value: 361 Ohm
Lead Channel Impedance Value: 437 Ohm
Lead Channel Impedance Value: 494 Ohm
Lead Channel Impedance Value: 494 Ohm
Lead Channel Pacing Threshold Amplitude: 0.625 V
Lead Channel Pacing Threshold Amplitude: 1 V
Lead Channel Pacing Threshold Pulse Width: 0.4 ms
Lead Channel Pacing Threshold Pulse Width: 0.4 ms
Lead Channel Sensing Intrinsic Amplitude: 1.5 mV
Lead Channel Sensing Intrinsic Amplitude: 1.5 mV
Lead Channel Sensing Intrinsic Amplitude: 10.5 mV
Lead Channel Sensing Intrinsic Amplitude: 10.5 mV
Lead Channel Setting Pacing Amplitude: 2 V
Lead Channel Setting Pacing Amplitude: 2 V
Lead Channel Setting Pacing Pulse Width: 0.4 ms
Lead Channel Setting Sensing Sensitivity: 2 mV

## 2021-03-26 NOTE — Progress Notes (Signed)
Remote pacemaker transmission.   

## 2021-06-19 ENCOUNTER — Ambulatory Visit (INDEPENDENT_AMBULATORY_CARE_PROVIDER_SITE_OTHER): Payer: Medicare HMO | Admitting: Cardiology

## 2021-06-19 ENCOUNTER — Other Ambulatory Visit: Payer: Self-pay

## 2021-06-19 ENCOUNTER — Encounter: Payer: Self-pay | Admitting: Cardiology

## 2021-06-19 DIAGNOSIS — I5032 Chronic diastolic (congestive) heart failure: Secondary | ICD-10-CM

## 2021-06-19 DIAGNOSIS — I350 Nonrheumatic aortic (valve) stenosis: Secondary | ICD-10-CM | POA: Insufficient documentation

## 2021-06-19 DIAGNOSIS — E7800 Pure hypercholesterolemia, unspecified: Secondary | ICD-10-CM

## 2021-06-19 DIAGNOSIS — Z950 Presence of cardiac pacemaker: Secondary | ICD-10-CM

## 2021-06-19 DIAGNOSIS — R079 Chest pain, unspecified: Secondary | ICD-10-CM

## 2021-06-19 NOTE — Assessment & Plan Note (Signed)
Working well, Dr. Caryl Comes has been monitoring.  Prior notes reviewed.

## 2021-06-19 NOTE — Assessment & Plan Note (Signed)
MSK sharp pain. Reassuring.

## 2021-06-19 NOTE — Assessment & Plan Note (Signed)
Mild in 09/2020. No surgical needs.

## 2021-06-19 NOTE — Assessment & Plan Note (Signed)
Continue with rosuvastatin 20 mg a day.

## 2021-06-19 NOTE — Progress Notes (Signed)
Cardiology Office Note:    Date:  06/19/2021   ID:  Heather [name], [name] 1940-12-22, MRN [id number]  PCP:  Patrecia Pour, Christean Grief, MD  [facility] Cardiologist:  Candee Furbish, MD  [facility] Electrophysiologist:  None   Referring MD: Patrecia Pour, Christean Grief, MD     History of Present Illness:    Heather [name] is a 80 y.o. female here for follow-up of diastolic heart failure, aortic stenosis, pacemaker.  Former patient of Dr. Sherryl Barters.  Dr. Caryl Comes follows pacemaker.  Medtronic.  At the previous visit, she had been feeling well without any problems no fevers chills nausea vomiting.  LDL 82 HDL 48 hemoglobin 13 creatinine 1.4  She had rare swelling in the ankles, lower extremities.    Today,  She is accompanied with her daughter who translates for her. She says she has had difficulties breathing and experiences fatigue. Before, her shortness of breath would be intermittent, but now it is more constant. She feels tired nowadays. Sometimes she feels spasms near her chest, she says its a "quick" sensation.   She loves to cook and her daughter says she does lots of of activity.  She is stressed about the battery change for her pacemaker.  She denies any chest pain. No lightheadedness, headaches, syncope, orthopnea, PND, lower extremity edema or exertional symptoms.  Past Medical History:  Diagnosis Date   CHF (congestive heart failure) (HCC)    Chronic back pain    Complete tear of right rotator cuff 06/02/2016   Crohn's disease ([place])    GERD (gastroesophageal reflux disease)    High cholesterol    History of diabetes mellitus, type II    History of echocardiogram    Echo 12/16: EF 60-65%, no RWMA, Gr 2 DD, mild AS (mean 13 mmHg), mild AI, MAC, trivial MS, mild MR, mild LAE, mild RVE, mild to mod TR, PASP 36 mmHg   Hypertension    Migraine    "1-2 times/yr" (02/16/2014)   Murmur, cardiac    OSA on CPAP    Osteopenia 08/16/2019   T=-1.8 dexa 2019    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   Pulmonary hypertension ([place])    Sinus bradycardia 01/29/2014   Skin cancer 2003   "3 cut off face" (02/16/2014)    Past Surgical History:  Procedure Laterality Date   [place]; 2005   CATARACT EXTRACTION Bilateral 1982   ESOPHAGOGASTRODUODENOSCOPY (EGD) WITH PROPOFOL N/A 12/28/2016   Procedure: ESOPHAGOGASTRODUODENOSCOPY (EGD) WITH PROPOFOL;  Surgeon: Otis Brace, MD;  Location: [facility] ENDOSCOPY;  Service: Gastroenterology;  Laterality: N/A;   INSERT / REPLACE / REMOVE PACEMAKER  02/14/2014   PERMANENT PACEMAKER INSERTION N/A 02/14/2014   Procedure: PERMANENT PACEMAKER INSERTION;  Surgeon: Deboraha Sprang, MD;  Location: [facility] CATH LAB;  Service: Cardiovascular;  Laterality: N/A;   SKIN CANCER EXCISION  2003    "face X 3" (02/16/2014)   TEMPORARY PACEMAKER INSERTION N/A 02/12/2014   Procedure: TEMPORARY PACEMAKER INSERTION;  Surgeon: Lorretta Harp, MD;  Location: [facility] CATH LAB;  Service: Cardiovascular;  Laterality: N/A;   VAGINAL HYSTERECTOMY  1972    Current Medications: Current Meds  Medication Sig   amLODipine (NORVASC) 5 MG tablet Take 1 tablet (5 mg total) by mouth daily.   aspirin 81 MG EC tablet Take 1 tablet (81 mg total) by mouth daily.   budesonide-formoterol (SYMBICORT) 160-4.5 MCG/ACT inhaler Inhale 2 puffs into the lungs 2 (two) times daily.   buprenorphine (BUTRANS) 15 MCG/HR Place 1 patch  onto the skin every Monday.    Camphor-Eucalyptus-Menthol (VICKS VAPORUB EX) Apply 1 application topically daily.   carvedilol (COREG) 6.25 MG tablet Take 1 tablet (6.25 mg total) by mouth 2 (two) times daily with a meal. PATIENT NEEDS TO SCHEDULE AN APPOINTMENT WITH DR Marlou Porch FOR FURTHER REFILLS 1ST ATTEMPT   FLUoxetine (PROZAC) 10 MG capsule Take 1 capsule (10 mg total) by mouth daily.   fluticasone (FLONASE) 50 MCG/ACT nasal spray Place 1 spray into both nostrils daily.    hydrochlorothiazide (MICROZIDE) 12.5 MG capsule Take 1 capsule  (12.5 mg total) by mouth daily.   HYDROcodone-acetaminophen (NORCO/VICODIN) 5-325 MG tablet Take 1 tablet by mouth every 8 (eight) hours as needed for severe pain.   losartan-hydrochlorothiazide (HYZAAR) 50-12.5 MG tablet Take 1 tablet by mouth daily.   montelukast (SINGULAIR) 10 MG tablet Take 1 tablet (10 mg total) by mouth at bedtime.   OVER THE COUNTER MEDICATION Take 2 sprays by mouth daily as needed (dry mouth). Biotene dry mouth spray   oxybutynin (DITROPAN-XL) 10 MG 24 hr tablet Take 1 tablet (10 mg total) by mouth at bedtime.   pantoprazole (PROTONIX) 40 MG tablet Take 1 tablet (40 mg total) by mouth daily.   rosuvastatin (CRESTOR) 20 MG tablet Take 20 mg by mouth at bedtime.    traZODone (DESYREL) 50 MG tablet Take 50 mg by mouth at bedtime.   triamcinolone (KENALOG) 0.1 % paste Use as directed 1 application in the mouth or throat 2 (two) times daily.     Allergies:   Amoxicillin-pot clavulanate   Social History   Socioeconomic History   Marital status: Widowed    Spouse name: Not on file   Number of children: 5   Years of education: middle school   Highest education level: Not on file  Occupational History   Not on file  Tobacco Use   Smoking status: Never   Smokeless tobacco: Never  Vaping Use   Vaping Use: Never used  Substance and Sexual Activity   Alcohol use: No   Drug use: No   Sexual activity: Never  Other Topics Concern   Not on file  Social History Narrative   Moved here from [place] in October 2013.   Right handed   1 cup of caffeine daily   Social Determinants of Health   Financial Resource Strain: Not on file  Food Insecurity: Not on file  Transportation Needs: Not on file  Physical Activity: Not on file  Stress: Not on file  Social Connections: Not on file     Family History: The patient's family history includes Breast cancer in her sister and sister; Heart disease in her mother; Hypertension in her mother. She was adopted.  ROS:    Please see the history of present illness.    (+) Difficulty Breathing/SOB (+) Chest Sensations (+) Fatigue All other systems reviewed and are negative.  EKG: EKG is personally reviewed and interpreted.   06/19/2021: Sinus Rhythm. Rate 75 bpm.   Chest Pa and Lateral 02/03/2021:  IMPRESSION: No acute processes are identified.   Findings: Cardiomediastinal silhouette and pulmonary vascularity are within normal limits. Left-sided pacing device. No lung consolidation or acute infiltrate is identified.  ECHO WO Enhancing Agent 10/02/2020:  Impression Left Ventricle: Systolic function is normal. EF: 55-60%.    Aortic Valve: There is mild stenosis, with peak and mean gradients of  26.000 and 13.000 mmHg.    Aortic Valve: Trace aortic valve regurgitation.    Mitral Valve: There is  mild to moderate 1-2+ regurgitation.    Tricuspid Valve: There is moderate regurgitation.    Tricuspid Valve: The right ventricular systolic pressure is mildly  elevated (37-49 mmHg).   CUP PACEART REMOTE DEVICE CHECK 03/14/2021: Conclusion:  Scheduled remote reviewed. Normal device function.    7 NSVT showing PAT with 1:1 AV conduction, 1 SVT, 6sec, rate 188  Next remote 91 days.  LR     Recent Labs: No results found for requested labs within last 8760 hours.  Recent Lipid Panel    Component Value Date/Time   CHOL 149 08/16/2019 1411   TRIG 93.0 08/16/2019 1411   HDL 48.50 08/16/2019 1411   CHOLHDL 3 08/16/2019 1411   VLDL 18.6 08/16/2019 1411   LDLCALC 82 08/16/2019 1411         Physical Exam:    VS:  BP 120/60 (BP Location: Left Arm, Patient Position: Sitting, Cuff Size: Normal)   Pulse 75   Ht _0  (1.499 m)   Wt 170 lb (77.1 kg)   LMP  (LMP Unknown)   BMI 34.34 kg/m     Wt Readings from Last 3 Encounters:  06/19/21 170 lb (77.1 kg)  07/03/20 158 lb (71.7 kg)  06/18/20 164 lb (74.4 kg)     GEN:  Well nourished, well developed in no acute distress HEENT: Normal NECK: No  JVD; No carotid bruits LYMPHATICS: No lymphadenopathy CARDIAC: RRR, 2/6 systolic murmur,no  rubs, gallops RESPIRATORY:  Clear to auscultation without rales, wheezing or rhonchi  ABDOMEN: Soft, non-tender, non-distended MUSCULOSKELETAL:  No edema; No deformity  SKIN: Warm and dry NEUROLOGIC:  Alert and oriented x 3 PSYCHIATRIC:  Normal affect   ASSESSMENT:    1. Pacemaker-Medtronic   2. Chronic diastolic congestive heart failure ([place])   3. Pure hypercholesterolemia   4. Nonrheumatic aortic valve stenosis   5. Chest pain of uncertain etiology     PLAN:    Pacemaker-Medtronic Working well, Dr. Caryl Comes has been monitoring.  Prior notes reviewed.  Diastolic CHF ([place]) Doing quite well.  Euvolemic.  Rarely has to take Lasix.  Continue with losartan hydrochlorothiazide combination. Overall doing well. Still has a ton of energy according to daughter.   Pure hypercholesterolemia Continue with rosuvastatin 20 mg a day.  Aortic stenosis Mild in 09/2020. No surgical needs.   Chest pain of uncertain etiology MSK sharp pain. Reassuring.    1 year follow-up.  Medication Adjustments/Labs and Tests Ordered: Current medicines are reviewed at length with the patient today.  Concerns regarding medicines are outlined above.  Orders Placed This Encounter  Procedures   EKG 12-Lead    No orders of the defined types were placed in this encounter.   Patient Instructions  Medication Instructions:  The current medical regimen is effective;  continue present plan and medications.  *If you need a refill on your cardiac medications before your next appointment, please call your pharmacy*  Follow-Up: At [facility], you and your health needs are our priority.  As part of our continuing mission to provide you with exceptional heart care, we have created designated Provider Care Teams.  These Care Teams include your primary Cardiologist (physician) and Advanced Practice Providers (APPs -   Physician Assistants and Nurse Practitioners) who all work together to provide you with the care you need, when you need it.  We recommend signing up for the patient portal called "MyChart".  Sign up information is provided on this After Visit Summary.  MyChart is used to connect with  patients for Virtual Visits (Telemedicine).  Patients are able to view lab/test results, encounter notes, upcoming appointments, etc.  Non-urgent messages can be sent to your provider as well.   To learn more about what you can do with MyChart, go to [email or web address].    Your next appointment:   1 year(s)  The format for your next appointment:   In Person  Provider:   Candee Furbish, MD     Thank you for choosing [place]!!      I,Tinashe Williams,acting as a scribe for Candee Furbish, MD.,have documented all relevant documentation on the behalf of Candee Furbish, MD,as directed by  Candee Furbish, MD while in the presence of Candee Furbish, MD.   I, Candee Furbish, MD, have reviewed all documentation for this visit. The documentation on 06/19/21 for the exam, diagnosis, procedures, and orders are all accurate and complete.   Signed, Candee Furbish, MD  06/19/2021 2:42 PM    [facility]

## 2021-06-19 NOTE — Assessment & Plan Note (Addendum)
Doing quite well.  Euvolemic.  Rarely has to take Lasix.  Continue with losartan hydrochlorothiazide combination. Overall doing well. Still has a ton of energy according to daughter.

## 2021-06-19 NOTE — Patient Instructions (Signed)
Medication Instructions:  The current medical regimen is effective;  continue present plan and medications.  *If you need a refill on your cardiac medications before your next appointment, please call your pharmacy*  Follow-Up: At [facility], you and your health needs are our priority.  As part of our continuing mission to provide you with exceptional heart care, we have created designated Provider Care Teams.  These Care Teams include your primary Cardiologist (physician) and Advanced Practice Providers (APPs -  Physician Assistants and Nurse Practitioners) who all work together to provide you with the care you need, when you need it.  We recommend signing up for the patient portal called "MyChart".  Sign up information is provided on this After Visit Summary.  MyChart is used to connect with patients for Virtual Visits (Telemedicine).  Patients are able to view lab/test results, encounter notes, upcoming appointments, etc.  Non-urgent messages can be sent to your provider as well.   To learn more about what you can do with MyChart, go to [email or web address].    Your next appointment:   1 year(s)  The format for your next appointment:   In Person  Provider:   Mark Skains, MD   Thank you for choosing [facility]!!    

## 2021-09-03 ENCOUNTER — Ambulatory Visit: Payer: Medicare HMO | Admitting: Physician Assistant

## 2021-09-17 ENCOUNTER — Ambulatory Visit (INDEPENDENT_AMBULATORY_CARE_PROVIDER_SITE_OTHER): Payer: Medicare HMO

## 2021-09-17 DIAGNOSIS — I495 Sick sinus syndrome: Secondary | ICD-10-CM | POA: Diagnosis not present

## 2021-09-18 LAB — CUP PACEART REMOTE DEVICE CHECK
Battery Remaining Longevity: 27 mo
Battery Voltage: 2.94 V
Brady Statistic AP VP Percent: 0.08 %
Brady Statistic AP VS Percent: 98.52 %
Brady Statistic AS VP Percent: 0 %
Brady Statistic AS VS Percent: 1.4 %
Brady Statistic RA Percent Paced: 98.42 %
Brady Statistic RV Percent Paced: 0.08 %
Date Time Interrogation Session: 20230302104956
Implantable Lead Implant Date: 20150729
Implantable Lead Implant Date: 20150729
Implantable Lead Location: 753859
Implantable Lead Location: 753860
Implantable Lead Model: 5076
Implantable Lead Model: 5076
Implantable Pulse Generator Implant Date: 20150729
Lead Channel Impedance Value: 342 Ohm
Lead Channel Impedance Value: 475 Ohm
Lead Channel Impedance Value: 494 Ohm
Lead Channel Impedance Value: 532 Ohm
Lead Channel Pacing Threshold Amplitude: 0.75 V
Lead Channel Pacing Threshold Amplitude: 1 V
Lead Channel Pacing Threshold Pulse Width: 0.4 ms
Lead Channel Pacing Threshold Pulse Width: 0.4 ms
Lead Channel Sensing Intrinsic Amplitude: 1.75 mV
Lead Channel Sensing Intrinsic Amplitude: 1.75 mV
Lead Channel Sensing Intrinsic Amplitude: 10.5 mV
Lead Channel Sensing Intrinsic Amplitude: 10.5 mV
Lead Channel Setting Pacing Amplitude: 2 V
Lead Channel Setting Pacing Amplitude: 2 V
Lead Channel Setting Pacing Pulse Width: 0.4 ms
Lead Channel Setting Sensing Sensitivity: 2 mV

## 2021-09-24 ENCOUNTER — Encounter: Payer: Self-pay | Admitting: Internal Medicine

## 2021-09-24 ENCOUNTER — Ambulatory Visit (INDEPENDENT_AMBULATORY_CARE_PROVIDER_SITE_OTHER): Payer: Medicare HMO | Admitting: Internal Medicine

## 2021-09-24 ENCOUNTER — Other Ambulatory Visit: Payer: Self-pay

## 2021-09-24 VITALS — BP 134/70 | HR 83 | Ht 60.0 in | Wt 187.0 lb

## 2021-09-24 DIAGNOSIS — I495 Sick sinus syndrome: Secondary | ICD-10-CM | POA: Diagnosis not present

## 2021-09-24 DIAGNOSIS — Z79899 Other long term (current) drug therapy: Secondary | ICD-10-CM | POA: Diagnosis not present

## 2021-09-24 LAB — CBC
Hematocrit: 40.9 % (ref 34.0–46.6)
Hemoglobin: 13.9 g/dL (ref 11.1–15.9)
MCH: 29.1 pg (ref 26.6–33.0)
MCHC: 34 g/dL (ref 31.5–35.7)
MCV: 86 fL (ref 79–97)
Platelets: 234 10*3/uL (ref 150–450)
RBC: 4.77 x10E6/uL (ref 3.77–5.28)
RDW: 14.9 % (ref 11.7–15.4)
WBC: 9.3 10*3/uL (ref 3.4–10.8)

## 2021-09-24 MED ORDER — FUROSEMIDE 20 MG PO TABS: 20.0000 mg | ORAL_TABLET | Freq: Every day | ORAL | 0 refills | Status: AC

## 2021-09-24 NOTE — Progress Notes (Signed)
Remote pacemaker transmission.   

## 2021-09-24 NOTE — Progress Notes (Signed)
? ? ? ? ?Patient Care Team: ?Patrecia Pour, Christean Grief, MD as PCP - General (Family Medicine) ?Jerline Pain, MD as PCP - Cardiology (Cardiology) ?Edrick Oh, MD as Consulting Physician (Nephrology) ?Jerline Pain, MD as Consulting Physician (Cardiology) ?Iran Planas, MD as Consulting Physician (Orthopedic Surgery) ? ? ?HPI ? ?Heather [name] is a 81 y.o. female ?Seen in follow-up for sinus node dysfunction for which she underwent pacemaker implantation Medtronic 7/15 ? ?Denies chest pain.  No nocturnal dyspnea orthopnea.  Dyspnea on exertion.  And increasing fatigue much worse over the last 2-4 months.  Some malodorous stool.  ? ?Does not snore. ? ? ?  ? ?DATE TEST EF   ?7/15 Echo   60   ? 9/17 Echo  60    LAE  (42/2.2/28)  ? ? ?Date Cr K Hgb  ?9/17 1.67 4.0   ?3/19 1.29 4.3   ?11/21 1.41 3.9 13.0  ?1/23 1.4 4.4 14.7  ? ? ? ?  ? ?Past Medical History:  ?Diagnosis Date  ? CHF (congestive heart failure) ([place])   ? Chronic back pain   ? Complete tear of right rotator cuff 06/02/2016  ? Crohn's disease ([place])   ? GERD (gastroesophageal reflux disease)   ? High cholesterol   ? History of diabetes mellitus, type II   ? History of echocardiogram   ? Echo 12/16: EF 60-65%, no RWMA, Gr 2 DD, mild AS (mean 13 mmHg), mild AI, MAC, trivial MS, mild MR, mild LAE, mild RVE, mild to mod TR, PASP 36 mmHg  ? Hypertension   ? Migraine   ? "1-2 times/yr" (02/16/2014)  ? Murmur, cardiac   ? OSA on CPAP   ? Osteopenia 08/16/2019  ? T=-1.8 dexa 2019  ? 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  ? Pulmonary hypertension ([place])   ? Sinus bradycardia 01/29/2014  ? Skin cancer 2003  ? "3 cut off face" (02/16/2014)  ? ? ?Past Surgical History:  ?Procedure Laterality Date  ? [place]; 2005  ? CATARACT EXTRACTION Bilateral 1982  ? ESOPHAGOGASTRODUODENOSCOPY (EGD) WITH PROPOFOL N/A 12/28/2016  ? Procedure: ESOPHAGOGASTRODUODENOSCOPY (EGD) WITH PROPOFOL;  Surgeon: Otis Brace, MD;  Location: [facility];  Service:  Gastroenterology;  Laterality: N/A;  ? INSERT / REPLACE / REMOVE PACEMAKER  02/14/2014  ? PERMANENT PACEMAKER INSERTION N/A 02/14/2014  ? Procedure: PERMANENT PACEMAKER INSERTION;  Surgeon: Deboraha Sprang, MD;  Location: [facility] CATH LAB;  Service: Cardiovascular;  Laterality: N/A;  ? SKIN CANCER EXCISION  2003   ? "face X 3" (02/16/2014)  ? TEMPORARY PACEMAKER INSERTION N/A 02/12/2014  ? Procedure: TEMPORARY PACEMAKER INSERTION;  Surgeon: Lorretta Harp, MD;  Location: [facility] CATH LAB;  Service: Cardiovascular;  Laterality: N/A;  ? VAGINAL HYSTERECTOMY  1972  ? ? ?Current Outpatient Medications  ?Medication Sig Dispense Refill  ? amLODipine (NORVASC) 5 MG tablet Take 1 tablet (5 mg total) by mouth daily. 90 tablet 3  ? aspirin 81 MG EC tablet Take 1 tablet (81 mg total) by mouth daily. 30 tablet 5  ? budesonide-formoterol (SYMBICORT) 160-4.5 MCG/ACT inhaler Inhale 2 puffs into the lungs 2 (two) times daily. 1 Inhaler 3  ? buprenorphine (BUTRANS) 15 MCG/HR Place 1 patch onto the skin every Monday.     ? Camphor-Eucalyptus-Menthol (VICKS VAPORUB EX) Apply 1 application topically daily.    ? carvedilol (COREG) 6.25 MG tablet Take 1 tablet (6.25 mg total) by mouth 2 (two) times daily with a meal. PATIENT NEEDS TO SCHEDULE AN APPOINTMENT WITH  DR Marlou Porch FOR FURTHER REFILLS 1ST ATTEMPT 180 tablet 3  ? FLUoxetine (PROZAC) 10 MG capsule Take 1 capsule (10 mg total) by mouth daily. 90 capsule 3  ? fluticasone (FLONASE) 50 MCG/ACT nasal spray Place 1 spray into both nostrils daily.     ? hydrochlorothiazide (MICROZIDE) 12.5 MG capsule Take 1 capsule (12.5 mg total) by mouth daily. 90 capsule 3  ? HYDROcodone-acetaminophen (NORCO/VICODIN) 5-325 MG tablet Take 1 tablet by mouth every 8 (eight) hours as needed for severe pain. 5 tablet 0  ? losartan-hydrochlorothiazide (HYZAAR) 50-12.5 MG tablet Take 1 tablet by mouth daily. 90 tablet 3  ? montelukast (SINGULAIR) 10 MG tablet Take 1 tablet (10 mg total) by mouth at bedtime. 90 tablet 3  ?  OVER THE COUNTER MEDICATION Take 2 sprays by mouth daily as needed (dry mouth). Biotene dry mouth spray    ? oxybutynin (DITROPAN-XL) 10 MG 24 hr tablet Take 1 tablet (10 mg total) by mouth at bedtime. 90 tablet 2  ? pantoprazole (PROTONIX) 40 MG tablet Take 1 tablet (40 mg total) by mouth daily. 90 tablet 3  ? rosuvastatin (CRESTOR) 20 MG tablet Take 20 mg by mouth at bedtime.     ? traZODone (DESYREL) 50 MG tablet Take 50 mg by mouth at bedtime.    ? triamcinolone (KENALOG) 0.1 % paste Use as directed 1 application in the mouth or throat 2 (two) times daily. 5 g 12  ? ?No current facility-administered medications for this visit.  ? ? ?Allergies  ?Allergen Reactions  ? Amoxicillin-Pot Clavulanate Swelling and Rash  ? ? ?Review of Systems negative except from HPI and PMH ? ?Physical Exam ?BP 134/70   Pulse 83   Ht 5' (1.524 m)   Wt 187 lb (84.8 kg)   LMP  (LMP Unknown)   SpO2 98%   BMI 36.52 kg/m?  ?Well developed and Morbidly obese in no acute distress ?HENT normal ?Neck supple with JVP-8 ?Clear ?Device pocket well healed; without hematoma or erythema.  There is no tethering  ?Regular rate and rhythm, no  gallop No murmur ?Abd-soft with active BS ?No Clubbing cyanosis 1+ edema ?Skin-warm and dry ?A & Oriented  Grossly normal sensory and motor function ? ?ECG a pacing  @ 75 ?22/09/40 ? ? ?Assessment and  Plan ? ?Sinus node dysfunction ? ?Hypertension ? ?HFpEF ? ?Chronic Kidney Disease ? ?Pacemaker implantation- Medtronic   ? ?Atrial fibrillation Subclinical  ? ?SCAF.  No indication for anticoagulation. ? ?Blood pressure is well controlled.  Continue her on carvedilol 6.25 twice daily losartan HCT 50/12.5 ? ?With dyspnea and volume overload, we will try a a short trial of furosemide 20 mg daily x3 days. ? ?I suggest that she follow-up with her PCP regarding the fatigue.  No new medications.  But given the complaints of malodorous stool, we will recheck a CBC.  It was normal in January. ? ?   ? ?  ?

## 2021-09-24 NOTE — Patient Instructions (Addendum)
Medication Instructions:  Your physician has recommended you make the following change in your medication:  TAKE Lasix 20 mg once daily for the next 3 days then stop  *If you need a refill on your cardiac medications before your next appointment, please call your pharmacy*   Lab Work: Today: CBC If you have labs (blood work) drawn today and your tests are completely normal, you will receive your results only by: [place] (if you have MyChart) OR A paper copy in the mail If you have any lab test that is abnormal or we need to change your treatment, we will call you to review the results.   Testing/Procedures: None ordered   Follow-Up: At [facility], you and your health needs are our priority.  As part of our continuing mission to provide you with exceptional heart care, we have created designated Provider Care Teams.  These Care Teams include your primary Cardiologist (physician) and Advanced Practice Providers (APPs -  Physician Assistants and Nurse Practitioners) who all work together to provide you with the care you need, when you need it.  Remote monitoring is used to monitor your Pacemaker or ICD from home. This monitoring reduces the number of office visits required to check your device to one time per year. It allows [place] to keep an eye on the functioning of your device to ensure it is working properly. You are scheduled for a device check from home on 12/17/2021. You may send your transmission at any time that day. If you have a wireless device, the transmission will be sent automatically. After your physician reviews your transmission, you will receive a postcard with your next transmission date.  Your next appointment:   1 year(s)  The format for your next appointment:   In Person  Provider:   With a member of the EP care team:      Tommye Standard or Oda Kilts   Thank you for choosing [facility]!!      Other Instructions   Furosemide Tablets Qu es  este medicamento? La FUROSEMIDA trata la presin arterial alta. Tambin puede usarse para reducir la hinchazn relacionada con la enfermedad cardiaca, renal o heptica. Ayuda a los riones a eliminar ms lquido y sal de la sangre a travs de la orina. Pertenece a un grupo de medicamentos llamados diurticos. Este medicamento puede ser utilizado para otros usos; si tiene alguna pregunta consulte con su proveedor de atencin mdica o con su farmacutico. MARCAS COMUNES: Active-Medicated Specimen Kit, Delone, Diuscreen, Lasix, RX Specimen Collection Kit, Specimen Collection Kit, Con-way Medicated Specimen Collection [employer] debo informar a mi profesional de la salud antes de tomar este medicamento? Necesitan saber si usted presenta alguno de los siguientes problemas o situaciones: Nivel anormal de electrolitos en la sangre Diarrea o vmito Gota Enfermedad cardiaca Enfermedad renal, bajo volumen de orina o dificultad para Garment/textile technologist Enfermedad heptica Enfermedad tiroidea Una reaccin alrgica o inusual a la furosemida, a las sulfonamidas, a otros medicamentos, alimentos, colorantes o conservantes Si est embarazada o buscando quedar embarazada Si est amamantando a un beb Cmo debo BlueLinx? Tome este medicamento por va oral. selo segn las instrucciones en la etiqueta a la misma hora todos [place]. Puede tomarlo con o sin alimentos. Si el Transport planner, tmelo con alimentos. Siga usndolo a menos que su equipo de atencin le indique dejar de Nature conservation officer. Hable con su equipo de atencin sobre el uso de este medicamento en nios. Puede requerir Sales executive.  Sobredosis: Pngase en contacto inmediatamente con un centro toxicolgico o una sala de urgencia si usted cree que haya tomado demasiado medicamento. ATENCIN: [employer]ra Foods es solo para usted. No comparta este medicamento con nadie. Qu sucede si me olvido de una dosis? Si olvida una dosis,  adminstrela lo antes posible. Si es casi la hora de la prxima dosis, administre solo esa dosis. No se administre dosis adicionales o dobles. Qu puede interactuar con este medicamento? Aspirina y otros medicamentos tipo aspirina Ciertos antibiticos Hidrato de cloral Cisplatino Ciclosporina Digoxina Diurticos Laxantes Litio Medicamentos para la presin arterial Medicamentos para Media planner los msculos antes de una ciruga Metotrexato AINE, medicamentos para Conservation officer, historic buildings o la inflamacin, tales como ibuprofeno, naproxeno o indometacina Fenitona Medicamentos esteroideos, tales como la prednisona o la cortisona Sucralfato Hormonas tiroideas Puede ser que esta lista no menciona todas las posibles interacciones. Informe a su profesional de KB Home	[place] de AES Corporation productos a base de hierbas, medicamentos de [place] o suplementos nutritivos que est tomando. Si usted fuma, consume bebidas alcohlicas o si utiliza drogas ilegales, indqueselo tambin a su profesional de KB Home	[place]. Algunas sustancias pueden interactuar con su medicamento. A qu debo estar atento al usar Coca-Cola? Visite a su equipo de atencin para que revise su evolucin peridicamente. Revise su presin arterial regularmente. Pregunte a su equipo de atencin cul debe ser su presin arterial y cundo deber contactarlos. Si es diabtico, revise su nivel de azcar en la sangre segn le hayan indicado. Este medicamento podra causar reacciones graves en la piel. Pueden presentarse semanas a meses despus de comenzar a Environmental consultant. Contacte a su equipo de atencin de inmediato si nota que tiene fiebre o sntomas gripales con una erupcin. La erupcin puede ser roja o Phill Myron, y luego puede convertirse en ampollas o descamacin de la piel. O bien, es posible que observe una erupcin roja con hinchazn en la cara, los labios o los ganglios linfticos en el cuello o debajo de los brazos. Es posible que deba seguir una  dieta especial mientras use este medicamento. Consulte a su equipo de atencin. Adems, pregunte cuntos vasos de lquido necesita tomar por da. No debe deshidratarse. Puede experimentar somnolencia o mareos. No conduzca, no utilice maquinaria ni haga nada que Associate Professor en estado de alerta hasta que sepa cmo le afecta este medicamento. No se siente ni se ponga de pie con rapidez, especialmente si es un paciente de edad avanzada. Esto reduce el riesgo de mareos o [employer] Company. El alcohol puede aumentar los mareos y la somnolencia. Evite consumir bebidas alcohlicas. Este medicamento puede aumentar su sensibilidad al sol. Evite la BB&T Corporation. Si no la Product manager, utilice ropa protectora y crema de Photographer. No utilice lmparas solares, camas solares ni cabinas solares. Qu efectos secundarios puedo tener al Masco Corporation este medicamento? Efectos secundarios que debe informar a su equipo de atencin tan pronto como sea posible: Reacciones alrgicas: erupcin cutnea, comezn/picazn, urticaria, hinchazn de la cara, los labios, la lengua o la garganta Deshidratacin: aumento de la sed, boca seca, sensacin de desmayo o aturdimiento, dolor de [place], [place] oscura o marrn Prdida de audicin, zumbido en los odos Niveles elevados de Dispensing optician (hiperglucemia): aumento de la sed o de la cantidad de Ciales, debilidad inusual, fatiga, visin borrosa Presin arterial baja: mareo, sensacin de desmayo o aturdimiento, visin borrosa Nivel bajo de potasio: dolor o calambres musculares, debilidad o fatiga inusuales, frecuencia cardiaca rpida o irregular, estreimiento Efectos secundarios que generalmente  no requieren atencin mdica (debe informarlos a su equipo de atencin si persisten o si son molestos): Ardor o sensacin de hormigueo en las manos o los pies Estreimiento Diarrea Mareos Dolor de cabeza Puede ser que esta lista no menciona todos los posibles efectos secundarios.  Comunquese a su mdico por asesoramiento mdico Humana Inc. Usted puede informar los efectos secundarios a la FDA por telfono al 1-800-FDA-1088. Dnde debo guardar mi medicina? Mantenga fuera del alcance de nios y Copy. Guarde a FPL Group, entre 20 y 21 grados Celsius (4 y 35 grados Fahrenheit). Proteja de la luz y la humedad. [place]. Deseche todo el medicamento que no haya utilizado despus de la fecha de vencimiento. ATENCIN: Este folleto es un resumen. Puede ser que no cubra toda la posible informacin. Si usted tiene preguntas acerca de esta medicina, consulte con su mdico, su farmacutico o su profesional de Technical sales engineer.  2022 Elsevier/Gold Standard (2021-02-03 00:00:00)

## 2021-12-17 ENCOUNTER — Ambulatory Visit (INDEPENDENT_AMBULATORY_CARE_PROVIDER_SITE_OTHER): Payer: Medicare HMO

## 2021-12-17 DIAGNOSIS — I495 Sick sinus syndrome: Secondary | ICD-10-CM

## 2021-12-23 LAB — CUP PACEART REMOTE DEVICE CHECK
Battery Remaining Longevity: 27 mo
Battery Voltage: 2.93 V
Brady Statistic AP VP Percent: 0.21 %
Brady Statistic AP VS Percent: 98.81 %
Brady Statistic AS VP Percent: 0 %
Brady Statistic AS VS Percent: 0.99 %
Brady Statistic RA Percent Paced: 98.96 %
Brady Statistic RV Percent Paced: 0.2 %
Date Time Interrogation Session: 20230601093727
Implantable Lead Implant Date: 20150729
Implantable Lead Implant Date: 20150729
Implantable Lead Location: 753859
Implantable Lead Location: 753860
Implantable Lead Model: 5076
Implantable Lead Model: 5076
Implantable Pulse Generator Implant Date: 20150729
Lead Channel Impedance Value: 361 Ohm
Lead Channel Impedance Value: 418 Ohm
Lead Channel Impedance Value: 475 Ohm
Lead Channel Impedance Value: 494 Ohm
Lead Channel Pacing Threshold Amplitude: 0.875 V
Lead Channel Pacing Threshold Amplitude: 1 V
Lead Channel Pacing Threshold Pulse Width: 0.4 ms
Lead Channel Pacing Threshold Pulse Width: 0.4 ms
Lead Channel Sensing Intrinsic Amplitude: 1.875 mV
Lead Channel Sensing Intrinsic Amplitude: 1.875 mV
Lead Channel Sensing Intrinsic Amplitude: 9.75 mV
Lead Channel Sensing Intrinsic Amplitude: 9.75 mV
Lead Channel Setting Pacing Amplitude: 2 V
Lead Channel Setting Pacing Amplitude: 2.25 V
Lead Channel Setting Pacing Pulse Width: 0.4 ms
Lead Channel Setting Sensing Sensitivity: 2 mV

## 2021-12-30 NOTE — Progress Notes (Signed)
Remote pacemaker transmission.   

## 2022-02-26 ENCOUNTER — Telehealth: Payer: Self-pay | Admitting: *Deleted

## 2022-02-26 ENCOUNTER — Encounter: Payer: Self-pay | Admitting: Internal Medicine

## 2022-02-26 NOTE — Progress Notes (Signed)
[place] DEVICE PROGRAMMING   Patient Information:  Patient: Heather [name]  MRN: [id number]  Date of Birth: Apr 08, 1941      Planned Procedure:  Dental Extraction - Amount of Teeth to be Pulled:  7 TEETH TO BE EXTRACTED   Surgeon:  DR. Durene Cal, DDS Surgeon's Group or Practice Name:  DR. Durene Cal, DDS Date of Procedure:  TBD                           Device Information:   Clinic EP Physician:   Dr. Virl Axe Device Type:  Pacemaker Manufacturer and Phone #:  Medtronic: [phone] Pacemaker Dependent?:  Yes Date of Last Device Check:  12/18/2021        Normal Device Function?:  Yes     Electrophysiologist's Recommendations:   Have magnet available. Provide continuous ECG monitoring when magnet is used or reprogramming is to be performed.  Procedure may interfere with device function.  Magnet should be placed over device during procedure.  Per Device Clinic Standing Orders, Simone Curia  02/26/2022 8:47 AM

## 2022-02-26 NOTE — Telephone Encounter (Addendum)
   Name: Heather [name]  DOB: 1940-08-06  MRN: [id number]  Primary Cardiologist: Candee Furbish, MD  Last visits: 11/2021 EP, 06/2021 [place]  Preoperative team, please contact this patient and set up a phone call appointment for further preoperative risk assessment. Please obtain consent and complete medication review. Thank you for your help.  I confirm that guidance regarding antiplatelet and oral anticoagulation therapy has been completed and, if necessary, noted below. No specific contraindications to holding ASA if needed based on prior notes.  Note: Spanish speaking. Will need to use [employer] telephone system for visit therefore would try and block her in a double spot or end of AM or end of PM as it will take additional time to relay the questions and answers.   Will need to review any SBE ppx needs at time of visit.   Charlie Pitter, PA-C 02/26/2022, 11:47 AM [place]

## 2022-02-26 NOTE — Telephone Encounter (Signed)
   Pre-operative Risk Assessment    Patient Name: Heather [name]  DOB: [birth year] MRN: [id number]  PT HAS A PACEMAKER. I WILL HAND OFF THE FORM TO DEVICE CLINIC AS WELL.    Request for Surgical Clearance    Procedure:  Dental Extraction - Amount of Teeth to be Pulled:  7 TEETH TO BE EXTRACTED   Date of Surgery:  Clearance TBD                                 Surgeon:  DR. Durene Cal, DDS Surgeon's Group or Practice Name:  DR. Durene Cal, DDS Phone number:  [phone] Fax number:  [phone]   Type of Clearance Requested:   - Medical ; ASA    Type of Anesthesia:  Local    Additional requests/questions:    Jiles Prows   02/26/2022, 8:41 AM

## 2022-02-27 NOTE — Telephone Encounter (Signed)
Message left through [facility] Interpreters for pt to call back for tele pre op appt.   I will update the requesting office as we have received another clearance request.

## 2022-03-03 ENCOUNTER — Telehealth: Payer: Self-pay | Admitting: *Deleted

## 2022-03-03 NOTE — Telephone Encounter (Signed)
S/w the pt through [employer] [phone]; interpreter [phone]. Pt agreeable to tele pre op appt 03/06/22 @ 2:40 . Med rec and consent are done.

## 2022-03-03 NOTE — Telephone Encounter (Signed)
S/w the pt through [employer] [phone]; interpreter [phone]. Pt agreeable to tele pre op appt 03/06/22 @ 2:40 . Med rec and consent are done.     Patient Consent for Virtual Visit        [place] has provided verbal consent on 03/03/2022 for a virtual visit (video or telephone).   CONSENT FOR VIRTUAL VISIT FOR:  [place]  By participating in this virtual visit I agree to the following:  I hereby voluntarily request, consent and authorize [place] and its employed or contracted physicians, physician assistants, nurse practitioners or other licensed health care professionals (the Practitioner), to provide me with telemedicine health care services (the "Services") as deemed necessary by the treating Practitioner. I acknowledge and consent to receive the Services by the Practitioner via telemedicine. I understand that the telemedicine visit will involve communicating with the Practitioner through live audiovisual communication technology and the disclosure of certain medical information by electronic transmission. I acknowledge that I have been given the opportunity to request an in-person assessment or other available alternative prior to the telemedicine visit and am voluntarily participating in the telemedicine visit.  I understand that I have the right to withhold or withdraw my consent to the use of telemedicine in the course of my care at any time, without affecting my right to future care or treatment, and that the Practitioner or I may terminate the telemedicine visit at any time. I understand that I have the right to inspect all information obtained and/or recorded in the course of the telemedicine visit and may receive copies of available information for a reasonable fee.  I understand that some of the potential risks of receiving the Services via telemedicine include:  Delay or interruption in medical evaluation due to technological  equipment failure or disruption; Information transmitted may not be sufficient (e.g. poor resolution of images) to allow for appropriate medical decision making by the Practitioner; and/or  In rare instances, security protocols could fail, causing a breach of personal health information.  Furthermore, I acknowledge that it is my responsibility to provide information about my medical history, conditions and care that is complete and accurate to the best of my ability. I acknowledge that Practitioner's advice, recommendations, and/or decision may be based on factors not within their control, such as incomplete or inaccurate data provided by me or distortions of diagnostic images or specimens that may result from electronic transmissions. I understand that the practice of medicine is not an exact science and that Practitioner makes no warranties or guarantees regarding treatment outcomes. I acknowledge that a copy of this consent can be made available to me via my patient portal ([place]), or I can request a printed copy by calling the office of [place].    I understand that my insurance will be billed for this visit.   I have read or had this consent read to me. I understand the contents of this consent, which adequately explains the benefits and risks of the Services being provided via telemedicine.  I have been provided ample opportunity to ask questions regarding this consent and the Services and have had my questions answered to my satisfaction. I give my informed consent for the services to be provided through the use of telemedicine in my medical care

## 2022-03-06 ENCOUNTER — Ambulatory Visit (INDEPENDENT_AMBULATORY_CARE_PROVIDER_SITE_OTHER): Payer: Medicare HMO | Admitting: Nurse Practitioner

## 2022-03-06 DIAGNOSIS — Z01810 Encounter for preprocedural cardiovascular examination: Secondary | ICD-10-CM

## 2022-03-06 NOTE — Progress Notes (Signed)
Virtual Visit via Telephone Note   Because of Heather [name]'s co-morbid illnesses, she is at least at moderate risk for complications without adequate follow up.  This format is felt to be most appropriate for this patient at this time.  The patient did not have access to video technology/had technical difficulties with video requiring transitioning to audio format only (telephone).  All issues noted in this document were discussed and addressed.  No physical exam could be performed with this format.  Please refer to the patient's chart for her consent to telehealth for [facility].  Evaluation Performed:  Preoperative cardiovascular risk assessment _____________   Date:  03/06/2022   Patient ID:  Heather [name], Alferd Apa 02-17-1941, MRN [id number] Patient Location:  Home Provider location:   Office  Primary Care Provider:  Patrecia Pour, Christean Grief, MD Primary Cardiologist:  Candee Furbish, MD  Chief Complaint / Patient Profile   80 y.o. y/o female with a h/o sinus node dysfunction s/p PPM, subclinical atrial fibrillation no on anticoagulation, chronic diastolic heart failure, hypertension, and CKD who is pending dental extraction (7 teeth) with Dr. Dorette Grate, DDS and presents today for telephonic preoperative cardiovascular risk assessment.  Past Medical History    Past Medical History:  Diagnosis Date   CHF (congestive heart failure) (HCC)    Chronic back pain    Complete tear of right rotator cuff 06/02/2016   Crohn's disease ([place])    GERD (gastroesophageal reflux disease)    High cholesterol    History of diabetes mellitus, type II    History of echocardiogram    Echo 12/16: EF 60-65%, no RWMA, Gr 2 DD, mild AS (mean 13 mmHg), mild AI, MAC, trivial MS, mild MR, mild LAE, mild RVE, mild to mod TR, PASP 36 mmHg   Hypertension    Migraine    "1-2 times/yr" (02/16/2014)   Murmur, cardiac    OSA on CPAP    Osteopenia 08/16/2019   T=-1.8 dexa 2019    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   Pulmonary hypertension ([place])    Sinus bradycardia 01/29/2014   Skin cancer 2003   "3 cut off face" (02/16/2014)   Past Surgical History:  Procedure Laterality Date   [place]; 2005   CATARACT EXTRACTION Bilateral 1982   ESOPHAGOGASTRODUODENOSCOPY (EGD) WITH PROPOFOL N/A 12/28/2016   Procedure: ESOPHAGOGASTRODUODENOSCOPY (EGD) WITH PROPOFOL;  Surgeon: Otis Brace, MD;  Location: [facility] ENDOSCOPY;  Service: Gastroenterology;  Laterality: N/A;   INSERT / REPLACE / REMOVE PACEMAKER  02/14/2014   PERMANENT PACEMAKER INSERTION N/A 02/14/2014   Procedure: PERMANENT PACEMAKER INSERTION;  Surgeon: Deboraha Sprang, MD;  Location: [facility] CATH LAB;  Service: Cardiovascular;  Laterality: N/A;   SKIN CANCER EXCISION  2003    "face X 3" (02/16/2014)   TEMPORARY PACEMAKER INSERTION N/A 02/12/2014   Procedure: TEMPORARY PACEMAKER INSERTION;  Surgeon: Lorretta Harp, MD;  Location[facility] C[facility] LAB;  Service: Cardiovascular;  Laterality: N/A;   VAGINAL HYSTERECTOMY  1972    Allergies  Allergies  Allergen Reactions   Amoxicillin-Pot Clavulanate Swelling and Rash    History of Present Illness    Heather [name] is a 81 y.o. female who presents via Engineer, civil (consulting) for a telehealth visit today via [employer] [place] [id number].  Pt was last seen in cardiology clinic on 09/24/2021 by Dr. Caryl Comes.  At that time [place] was doing well.  The patient is now pending procedure as outlined above. Since  her last visit, she has done well from a cardiac standpoint. She denies chest pain, palpitations, dyspnea, pnd, orthopnea, n, v, dizziness, syncope, edema, weight gain, or early satiety. All other systems reviewed and are otherwise negative except as noted above.   Home Medications    Prior to Admission medications   Medication Sig Start Date End Date Taking? Authorizing Provider  amLODipine (NORVASC) 5 MG tablet  Take 1 tablet (5 mg total) by mouth daily. 07/03/20   Jerline Pain, MD  aspirin 81 MG EC tablet Take 1 tablet (81 mg total) by mouth daily. 05/08/15   Funches, Adriana Mccallum, MD  budesonide-formoterol (SYMBICORT) 160-4.5 MCG/ACT inhaler Inhale 2 puffs into the lungs 2 (two) times daily. 05/08/19   Briscoe Deutscher, DO  buprenorphine Haze Rushing) 15 MCG/HR Place 1 patch onto the skin every Monday.     [provider]  Camphor-Eucalyptus-Menthol (VICKS VAPORUB EX) Apply 1 application topically daily.    [provider]  carvedilol (COREG) 6.25 MG tablet Take 1 tablet (6.25 mg total) by mouth 2 (two) times daily with a meal. PATIENT NEEDS TO SCHEDULE AN APPOINTMENT WITH DR Marlou Porch FOR FURTHER REFILLS 1ST ATTEMPT 07/03/20   Jerline Pain, MD  FLUoxetine (PROZAC) 10 MG capsule Take 1 capsule (10 mg total) by mouth daily. 02/01/19   Briscoe Deutscher, DO  fluticasone (FLONASE) 50 MCG/ACT nasal spray Place 1 spray into both nostrils daily.  04/16/20   [provider]  furosemide (LASIX) 20 MG tablet Take 1 tablet (20 mg total) by mouth daily. 09/24/21   Deboraha Sprang, MD  hydrochlorothiazide (MICROZIDE) 12.5 MG capsule Take 1 capsule (12.5 mg total) by mouth daily. 07/03/20   Jerline Pain, MD  HYDROcodone-acetaminophen (NORCO/VICODIN) 5-325 MG tablet Take 1 tablet by mouth every 8 (eight) hours as needed for severe pain. 10/19/19   Jaynee Eagles, PA-C  losartan-hydrochlorothiazide (HYZAAR) 50-12.5 MG tablet Take 1 tablet by mouth daily. 08/16/19   Leamon Arnt, MD  montelukast (SINGULAIR) 10 MG tablet Take 1 tablet (10 mg total) by mouth at bedtime. 09/30/18   Briscoe Deutscher, DO  OVER THE COUNTER MEDICATION Take 2 sprays by mouth daily as needed (dry mouth). Biotene dry mouth spray    [provider]  oxybutynin (DITROPAN-XL) 10 MG 24 hr tablet Take 1 tablet (10 mg total) by mouth at bedtime. 05/08/19   Briscoe Deutscher, DO  pantoprazole (PROTONIX) 40 MG tablet Take 1 tablet (40 mg total)  by mouth daily. 02/01/19   Briscoe Deutscher, DO  rosuvastatin (CRESTOR) 20 MG tablet Take 20 mg by mouth at bedtime.  08/16/19   Leamon Arnt, MD  traZODone (DESYREL) 50 MG tablet Take 50 mg by mouth at bedtime.    [provider]  triamcinolone (KENALOG) 0.1 % paste Use as directed 1 application in the mouth or throat 2 (two) times daily. 05/08/19   Briscoe Deutscher, DO    Physical Exam    Vital Signs:  Heather [name] does not have vital signs available for review today.  Given telephonic nature of communication, physical exam is limited. AAOx3. NAD. Normal affect.  Speech and respirations are unlabored.  Accessory Clinical Findings    None  Assessment & Plan    1.  Preoperative Cardiovascular Risk Assessment:  According to the Revised Cardiac Risk Index (RCRI), her Perioperative Risk of Major Cardiac Event is (%): 0.9. Her Functional Capacity in METs is: 5.07 according to the Duke Activity Status Index (DASI).Therefore, based on ACC/AHA guidelines,  patient would be at acceptable risk for the planned procedure without further cardiovascular testing. SBE is not required for this patient.    A copy of this note will be routed to requesting surgeon.  Time:   Today, I have spent 9 minutes with the patient with telehealth technology discussing medical history, symptoms, and management plan.     Lenna Sciara, NP  03/06/2022, 2:53 PM

## 2022-03-18 ENCOUNTER — Ambulatory Visit (INDEPENDENT_AMBULATORY_CARE_PROVIDER_SITE_OTHER): Payer: Medicare HMO

## 2022-03-18 DIAGNOSIS — I495 Sick sinus syndrome: Secondary | ICD-10-CM | POA: Diagnosis not present

## 2022-03-24 LAB — CUP PACEART REMOTE DEVICE CHECK
Battery Remaining Longevity: 21 mo
Battery Voltage: 2.92 V
Brady Statistic AP VP Percent: 0.09 %
Brady Statistic AP VS Percent: 99.07 %
Brady Statistic AS VP Percent: 0 %
Brady Statistic AS VS Percent: 0.84 %
Brady Statistic RA Percent Paced: 99.11 %
Brady Statistic RV Percent Paced: 0.09 %
Date Time Interrogation Session: 20230830110056
Implantable Lead Implant Date: 20150729
Implantable Lead Implant Date: 20150729
Implantable Lead Location: 753859
Implantable Lead Location: 753860
Implantable Lead Model: 5076
Implantable Lead Model: 5076
Implantable Pulse Generator Implant Date: 20150729
Lead Channel Impedance Value: 380 Ohm
Lead Channel Impedance Value: 475 Ohm
Lead Channel Impedance Value: 494 Ohm
Lead Channel Impedance Value: 513 Ohm
Lead Channel Pacing Threshold Amplitude: 0.625 V
Lead Channel Pacing Threshold Amplitude: 1 V
Lead Channel Pacing Threshold Pulse Width: 0.4 ms
Lead Channel Pacing Threshold Pulse Width: 0.4 ms
Lead Channel Sensing Intrinsic Amplitude: 1.75 mV
Lead Channel Sensing Intrinsic Amplitude: 1.75 mV
Lead Channel Sensing Intrinsic Amplitude: 10.125 mV
Lead Channel Sensing Intrinsic Amplitude: 10.125 mV
Lead Channel Setting Pacing Amplitude: 2 V
Lead Channel Setting Pacing Amplitude: 2.25 V
Lead Channel Setting Pacing Pulse Width: 0.4 ms
Lead Channel Setting Sensing Sensitivity: 2 mV

## 2022-04-09 NOTE — Progress Notes (Signed)
Remote pacemaker transmission.   

## 2022-07-09 ENCOUNTER — Ambulatory Visit (INDEPENDENT_AMBULATORY_CARE_PROVIDER_SITE_OTHER): Payer: Medicare HMO

## 2022-07-09 DIAGNOSIS — I495 Sick sinus syndrome: Secondary | ICD-10-CM

## 2022-07-09 LAB — CUP PACEART REMOTE DEVICE CHECK
Battery Remaining Longevity: 20 mo
Battery Voltage: 2.91 V
Brady Statistic AP VP Percent: 0.05 %
Brady Statistic AP VS Percent: 99.53 %
Brady Statistic AS VP Percent: 0 %
Brady Statistic AS VS Percent: 0.42 %
Brady Statistic RA Percent Paced: 99.55 %
Brady Statistic RV Percent Paced: 0.05 %
Date Time Interrogation Session: 20231221091202
Implantable Lead Connection Status: 753985
Implantable Lead Connection Status: 753985
Implantable Lead Implant Date: 20150729
Implantable Lead Implant Date: 20150729
Implantable Lead Location: 753859
Implantable Lead Location: 753860
Implantable Lead Model: 5076
Implantable Lead Model: 5076
Implantable Pulse Generator Implant Date: 20150729
Lead Channel Impedance Value: 380 Ohm
Lead Channel Impedance Value: 494 Ohm
Lead Channel Impedance Value: 513 Ohm
Lead Channel Impedance Value: 532 Ohm
Lead Channel Pacing Threshold Amplitude: 0.625 V
Lead Channel Pacing Threshold Amplitude: 1 V
Lead Channel Pacing Threshold Pulse Width: 0.4 ms
Lead Channel Pacing Threshold Pulse Width: 0.4 ms
Lead Channel Sensing Intrinsic Amplitude: 1.625 mV
Lead Channel Sensing Intrinsic Amplitude: 1.625 mV
Lead Channel Sensing Intrinsic Amplitude: 11 mV
Lead Channel Sensing Intrinsic Amplitude: 11 mV
Lead Channel Setting Pacing Amplitude: 2 V
Lead Channel Setting Pacing Amplitude: 2 V
Lead Channel Setting Pacing Pulse Width: 0.4 ms
Lead Channel Setting Sensing Sensitivity: 2 mV
Zone Setting Status: 755011
Zone Setting Status: 755011

## 2022-07-31 NOTE — Progress Notes (Signed)
Remote pacemaker transmission.   

## 2022-09-16 NOTE — Progress Notes (Signed)
  Electrophysiology Office Note  Date: 09/21/2022 ID:  Heather [name], Heather [name] 01/22/1941, MRN [id number]  Primary Cardiologist: Candee Furbish, MD Electrophysiologist: Virl Axe, MD   Heather [name] is a 82 y.o. female with history of SND s/p PPM, subclinical AF monitored not on [place], HTN, chronic diastolic CHF, and CKD II-III seen today for routine electrophysiology followup. Since last being seen in our clinic the patient reports doing well overall.  she denies palpitations, dyspnea, PND, orthopnea, nausea, vomiting, dizziness, syncope, edema, weight gain, or early satiety.  She occasionally has atypical, right sided chest pain in certain positions or leaning over.  Not related to exertion.   Device History: Medtronic Dual Chamber PPM implanted 2015 for SND  Review of systems completed and found to be negative unless listed in HPI.   Allergies, Past Medical, Surgical, Social, and Family Histories have been reviewed and are referenced here-in when relevant for medical decision making.   Current Outpatient Medications  Medication Instructions   amLODipine (NORVASC) 5 mg, Oral, Daily   aspirin EC 81 mg, Oral, Daily   budesonide-formoterol (SYMBICORT) 160-4.5 MCG/ACT inhaler 2 puffs, Inhalation, 2 times daily   buprenorphine (BUTRANS) 15 MCG/HR 1 patch, Every Mon   Camphor-Eucalyptus-Menthol (VICKS VAPORUB EX) 1 application , Apply externally, Daily   carvedilol (COREG) 6.25 mg, Oral, 2 times daily with meals, PATIENT NEEDS TO SCHEDULE AN APPOINTMENT WITH DR Marlou Porch FOR FURTHER REFILLS 1ST ATTEMPT   FLUoxetine (PROZAC) 10 mg, Oral, Daily   fluticasone (FLONASE) 50 MCG/ACT nasal spray 1 spray, Daily   furosemide (LASIX) 20 mg, Oral, Daily   hydrochlorothiazide (MICROZIDE) 12.5 mg, Oral, Daily   HYDROcodone-acetaminophen (NORCO/VICODIN) 5-325 MG tablet 1 tablet, Oral, Every 8 hours PRN   losartan-hydrochlorothiazide (HYZAAR) 50-12.5 MG tablet 1 tablet, Oral, Daily   montelukast  (SINGULAIR) 10 mg, Oral, Daily at bedtime   OVER THE COUNTER MEDICATION 2 sprays, Oral, Daily PRN, Biotene dry mouth spray    oxybutynin (DITROPAN-XL) 10 mg, Oral, Daily at bedtime   pantoprazole (PROTONIX) 40 mg, Oral, Daily   rosuvastatin (CRESTOR) 20 mg, Oral, Nightly   traZODone (DESYREL) 50 mg, Oral, Daily at bedtime   triamcinolone (KENALOG) 0.1 % paste 1 application , Mouth/Throat, 2 times daily    Physical Exam: Vitals:   09/21/22 1046  BP: 124/68  Pulse: 76  SpO2: 97%  Weight: 169 lb 9.6 oz (76.9 kg)  Height: 5' (1.524 m)     GEN- NAD. A&O x 3. Normal affect HEENT: Normocephalic, atraumatic Lungs- CTAB, Normal effort.  Heart- Regular rate and rhythm rate and rhythm. No M/G/R. PPM pocket well healed. Extremities- No peripheral edema. no clubbing or cyanosis  Studies Reviewed: Previous EP office notes   PPM Interrogation-  reviewed in detail today,  See PACEART report.  EKG is ordered today. Personal review shows A paced V sensed rhythm at 79 bpm  Assessment and Plan:  1. SND s/p Medtronic PPM  Normal PPM function See Pace Art report No changes today  2. Subclinical atrial fibrillation Continue to follow through device.  No indication for anticoagulation at this time  3. HTN Stable on current regimen   Disposition:   Follow up with Dr. Caryl Comes in 12 months   Signed, Shirley Friar, PA-C

## 2022-09-21 ENCOUNTER — Encounter: Payer: Self-pay | Admitting: Student

## 2022-09-21 ENCOUNTER — Ambulatory Visit: Payer: Medicare HMO | Attending: Student | Admitting: Student

## 2022-09-21 VITALS — BP 124/68 | HR 76 | Ht 60.0 in | Wt 169.6 lb

## 2022-09-21 DIAGNOSIS — I495 Sick sinus syndrome: Secondary | ICD-10-CM | POA: Diagnosis not present

## 2022-09-21 DIAGNOSIS — I272 Pulmonary hypertension, unspecified: Secondary | ICD-10-CM

## 2022-09-21 DIAGNOSIS — E1169 Type 2 diabetes mellitus with other specified complication: Secondary | ICD-10-CM | POA: Diagnosis not present

## 2022-09-21 DIAGNOSIS — I5032 Chronic diastolic (congestive) heart failure: Secondary | ICD-10-CM | POA: Diagnosis not present

## 2022-09-21 DIAGNOSIS — E785 Hyperlipidemia, unspecified: Secondary | ICD-10-CM

## 2022-09-21 LAB — CUP PACEART INCLINIC DEVICE CHECK
Battery Remaining Longevity: 18 mo
Battery Voltage: 2.91 V
Brady Statistic AP VP Percent: 0.09 %
Brady Statistic AP VS Percent: 99.26 %
Brady Statistic AS VP Percent: 0 %
Brady Statistic AS VS Percent: 0.65 %
Brady Statistic RA Percent Paced: 99.31 %
Brady Statistic RV Percent Paced: 0.09 %
Date Time Interrogation Session: 20240304111436
Implantable Lead Connection Status: 753985
Implantable Lead Connection Status: 753985
Implantable Lead Implant Date: 20150729
Implantable Lead Implant Date: 20150729
Implantable Lead Location: 753859
Implantable Lead Location: 753860
Implantable Lead Model: 5076
Implantable Lead Model: 5076
Implantable Pulse Generator Implant Date: 20150729
Lead Channel Impedance Value: 361 Ohm
Lead Channel Impedance Value: 494 Ohm
Lead Channel Impedance Value: 494 Ohm
Lead Channel Impedance Value: 551 Ohm
Lead Channel Pacing Threshold Amplitude: 0.75 V
Lead Channel Pacing Threshold Amplitude: 1 V
Lead Channel Pacing Threshold Pulse Width: 0.4 ms
Lead Channel Pacing Threshold Pulse Width: 0.4 ms
Lead Channel Sensing Intrinsic Amplitude: 1.625 mV
Lead Channel Sensing Intrinsic Amplitude: 1.75 mV
Lead Channel Sensing Intrinsic Amplitude: 10.875 mV
Lead Channel Sensing Intrinsic Amplitude: 9.5 mV
Lead Channel Setting Pacing Amplitude: 2 V
Lead Channel Setting Pacing Amplitude: 2 V
Lead Channel Setting Pacing Pulse Width: 0.4 ms
Lead Channel Setting Sensing Sensitivity: 2 mV
Zone Setting Status: 755011
Zone Setting Status: 755011

## 2022-09-21 MED ORDER — AMLODIPINE BESYLATE 5 MG PO TABS: 5.0000 mg | ORAL_TABLET | Freq: Every day | ORAL | 3 refills | Status: AC

## 2022-09-21 MED ORDER — ROSUVASTATIN CALCIUM 20 MG PO TABS: 20.0000 mg | ORAL_TABLET | Freq: Every evening | ORAL | 3 refills | Status: AC

## 2022-09-21 NOTE — Patient Instructions (Signed)
Medication Instructions:  Your physician recommends that you continue on your current medications as directed. Please refer to the Current Medication list given to you today.  *If you need a refill on your cardiac medications before your next appointment, please call your pharmacy*   Lab Work: None If you have labs (blood work) drawn today and your tests are completely normal, you will receive your results only by: [place] (if you have MyChart) OR A paper copy in the mail If you have any lab test that is abnormal or we need to change your treatment, we will call you to review the results.   Follow-Up: At [facility], you and your health needs are our priority.  As part of our continuing mission to provide you with exceptional heart care, we have created designated Provider Care Teams.  These Care Teams include your primary Cardiologist (physician) and Advanced Practice Providers (APPs -  Physician Assistants and Nurse Practitioners) who all work together to provide you with the care you need, when you need it.   Your next appointment:   1 year(s)  Provider:   Virl Axe, MD

## 2022-10-08 ENCOUNTER — Ambulatory Visit (INDEPENDENT_AMBULATORY_CARE_PROVIDER_SITE_OTHER): Payer: Medicare HMO

## 2022-10-08 DIAGNOSIS — I495 Sick sinus syndrome: Secondary | ICD-10-CM

## 2022-10-13 LAB — CUP PACEART REMOTE DEVICE CHECK
Battery Remaining Longevity: 19 mo
Battery Voltage: 2.9 V
Brady Statistic AP VP Percent: 0.04 %
Brady Statistic AP VS Percent: 99.15 %
Brady Statistic AS VP Percent: 0 %
Brady Statistic AS VS Percent: 0.82 %
Brady Statistic RA Percent Paced: 99.16 %
Brady Statistic RV Percent Paced: 0.04 %
Date Time Interrogation Session: 20240325173948
Implantable Lead Connection Status: 753985
Implantable Lead Connection Status: 753985
Implantable Lead Implant Date: 20150729
Implantable Lead Implant Date: 20150729
Implantable Lead Location: 753859
Implantable Lead Location: 753860
Implantable Lead Model: 5076
Implantable Lead Model: 5076
Implantable Pulse Generator Implant Date: 20150729
Lead Channel Impedance Value: 361 Ohm
Lead Channel Impedance Value: 475 Ohm
Lead Channel Impedance Value: 475 Ohm
Lead Channel Impedance Value: 532 Ohm
Lead Channel Pacing Threshold Amplitude: 0.625 V
Lead Channel Pacing Threshold Amplitude: 1.125 V
Lead Channel Pacing Threshold Pulse Width: 0.4 ms
Lead Channel Pacing Threshold Pulse Width: 0.4 ms
Lead Channel Sensing Intrinsic Amplitude: 1.625 mV
Lead Channel Sensing Intrinsic Amplitude: 1.625 mV
Lead Channel Sensing Intrinsic Amplitude: 10.125 mV
Lead Channel Sensing Intrinsic Amplitude: 10.125 mV
Lead Channel Setting Pacing Amplitude: 2 V
Lead Channel Setting Pacing Amplitude: 2.25 V
Lead Channel Setting Pacing Pulse Width: 0.4 ms
Lead Channel Setting Sensing Sensitivity: 2 mV
Zone Setting Status: 755011
Zone Setting Status: 755011

## 2022-11-11 NOTE — Progress Notes (Signed)
Remote pacemaker transmission.   

## 2022-12-07 ENCOUNTER — Encounter: Payer: Self-pay | Admitting: Cardiology

## 2022-12-07 ENCOUNTER — Ambulatory Visit: Payer: Medicare HMO | Attending: Cardiology | Admitting: Cardiology

## 2022-12-07 VITALS — BP 130/70 | HR 76 | Ht 60.0 in | Wt 172.0 lb

## 2022-12-07 DIAGNOSIS — I350 Nonrheumatic aortic (valve) stenosis: Secondary | ICD-10-CM | POA: Diagnosis not present

## 2022-12-07 DIAGNOSIS — I5032 Chronic diastolic (congestive) heart failure: Secondary | ICD-10-CM | POA: Diagnosis not present

## 2022-12-07 DIAGNOSIS — I495 Sick sinus syndrome: Secondary | ICD-10-CM | POA: Diagnosis not present

## 2022-12-07 NOTE — Progress Notes (Signed)
Cardiology Office Note:    Date:  12/07/2022   ID:  Heather [name], Ada August 30, 1940, MRN [id number]  PCP:  Randel Pigg, Dorma Russell, MD   [facility] Providers Cardiologist:  Donato Schultz, MD Electrophysiologist:  Sherryl Manges, MD     Referring MD: Randel Pigg, Dorma Russell, MD    History of Present Illness:    Heather [name] is a 82 y.o. female with Medtronic pacemaker followed by Dr. Graciela Husbands, diastolic heart failure aortic stenosis former patient of Dr. Yevonne Pax here for follow-up.  She is assisted by interpreter for historical items.  Previous EP notes reviewed.  Stable.  Had dental extractions.  Overall has some decreased energy.  No chest pain no significant shortness of breath.  Daughter with her.  Showed a several labs.  Creatinine 2.3, potassium slightly elevated at 5.3.  LDL was 67.  Outside labs.  Past Medical History:  Diagnosis Date   CHF (congestive heart failure) (HCC)    Chronic back pain    Complete tear of right rotator cuff 06/02/2016   Crohn's disease (HCC)    GERD (gastroesophageal reflux disease)    High cholesterol    History of diabetes mellitus, type II    History of echocardiogram    Echo 12/16: EF 60-65%, no RWMA, Gr 2 DD, mild AS (mean 13 mmHg), mild AI, MAC, trivial MS, mild MR, mild LAE, mild RVE, mild to mod TR, PASP 36 mmHg   Hypertension    Migraine    "1-2 times/yr" (02/16/2014)   Murmur, cardiac    OSA on CPAP    Osteopenia 08/16/2019   T=-1.8 dexa 2019   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   Pulmonary hypertension (HCC)    Sinus bradycardia 01/29/2014   Skin cancer 2003   "3 cut off face" (02/16/2014)    Past Surgical History:  Procedure Laterality Date   CARDIAC CATHETERIZATION  1995; 2005   CATARACT EXTRACTION Bilateral 1982   ESOPHAGOGASTRODUODENOSCOPY (EGD) WITH PROPOFOL N/A 12/28/2016   Procedure: ESOPHAGOGASTRODUODENOSCOPY (EGD) WITH PROPOFOL;  Surgeon: Kathi Der, MD;  Location: [facility] ENDOSCOPY;  Service:  Gastroenterology;  Laterality: N/A;   INSERT / REPLACE / REMOVE PACEMAKER  02/14/2014   PERMANENT PACEMAKER INSERTION N/A 02/14/2014   Procedure: PERMANENT PACEMAKER INSERTION;  Surgeon: Duke Salvia, MD;  Location: [facility] CATH LAB;  Service: Cardiovascular;  Laterality: N/A;   SKIN CANCER EXCISION  2003    "face X 3" (02/16/2014)   TEMPORARY PACEMAKER INSERTION N/A 02/12/2014   Procedure: TEMPORARY PACEMAKER INSERTION;  Surgeon: Runell Gess, MD;  Location: [facility] CATH LAB;  Service: Cardiovascular;  Laterality: N/A;   VAGINAL HYSTERECTOMY  1972    Current Medications: Current Meds  Medication Sig   albuterol (VENTOLIN HFA) 108 (90 Base) MCG/ACT inhaler Inhale into the lungs as needed for wheezing or shortness of breath.   amLODipine (NORVASC) 5 MG tablet Take 1 tablet (5 mg total) by mouth daily.   aspirin 81 MG EC tablet Take 1 tablet (81 mg total) by mouth daily.   budesonide-formoterol (SYMBICORT) 160-4.5 MCG/ACT inhaler Inhale 2 puffs into the lungs 2 (two) times daily.   Camphor-Eucalyptus-Menthol (VICKS VAPORUB EX) Apply 1 application topically daily.   carvedilol (COREG) 6.25 MG tablet Take 1 tablet (6.25 mg total) by mouth 2 (two) times daily with a meal. PATIENT NEEDS TO SCHEDULE AN APPOINTMENT WITH DR Anne Fu FOR FURTHER REFILLS 1ST ATTEMPT   FLUoxetine (PROZAC) 10 MG capsule Take 1 capsule (10 mg total) by mouth daily.   fluticasone (  FLONASE) 50 MCG/ACT nasal spray Place 1 spray into both nostrils as needed for allergies.   furosemide (LASIX) 20 MG tablet Take 1 tablet (20 mg total) by mouth daily.   hydrochlorothiazide (MICROZIDE) 12.5 MG capsule Take 1 capsule (12.5 mg total) by mouth daily.   HYDROcodone-acetaminophen (NORCO/VICODIN) 5-325 MG tablet Take 1 tablet by mouth every 8 (eight) hours as needed for severe pain.   hydrocortisone (ANUSOL-HC) 2.5 % rectal cream Place rectally as needed for anal itching.   losartan (COZAAR) 50 MG tablet Take 50 mg by mouth daily.    montelukast (SINGULAIR) 10 MG tablet Take 1 tablet (10 mg total) by mouth at bedtime.   omeprazole (PRILOSEC) 40 MG capsule Take by mouth in the morning and at bedtime.   OVER THE COUNTER MEDICATION Take 2 sprays by mouth daily as needed (dry mouth). Biotene dry mouth spray   oxybutynin (DITROPAN-XL) 10 MG 24 hr tablet Take 1 tablet (10 mg total) by mouth at bedtime.   rosuvastatin (CRESTOR) 20 MG tablet Take 1 tablet (20 mg total) by mouth at bedtime.   tiZANidine (ZANAFLEX) 2 MG tablet Take by mouth as needed for muscle spasms.   traZODone (DESYREL) 50 MG tablet Take 50 mg by mouth at bedtime.   triamcinolone (KENALOG) 0.1 % paste Use as directed 1 application in the mouth or throat 2 (two) times daily. (Patient taking differently: Use as directed 1 application  in the mouth or throat as needed (dry mouth).)     Allergies:   Amoxicillin-pot clavulanate   Social History   Socioeconomic History   Marital status: Widowed    Spouse name: Not on file   Number of children: 5   Years of education: middle school   Highest education level: Not on file  Occupational History   Not on file  Tobacco Use   Smoking status: Never   Smokeless tobacco: Never  Vaping Use   Vaping Use: Never used  Substance and Sexual Activity   Alcohol use: No   Drug use: No   Sexual activity: Never  Other Topics Concern   Not on file  Social History Narrative   Moved here from [place] in October 2013.   Right handed   1 cup of caffeine daily   Social Determinants of Health   Financial Resource Strain: Not on file  Food Insecurity: Not on file  Transportation Needs: Not on file  Physical Activity: Not on file  Stress: Not on file  Social Connections: Not on file     Family History: The patient's family history includes Breast cancer in her sister and sister; Heart disease in her mother; Hypertension in her mother. She was adopted.  ROS:   Please see the history of present illness.    Denies any  fevers chills nausea vomiting all other systems reviewed and are negative.  EKGs/Labs/Other Studies Reviewed:    The following studies were reviewed today: Cardiac Studies & Procedures       ECHOCARDIOGRAM  ECHOCARDIOGRAM COMPLETE 04/18/2016  Narrative *[facility]* *[facility]* [address] [place], [place] [address] [phone]  ------------------------------------------------------------------- Transthoracic Echocardiography  Patient:    Santa, Lura MR #:       [id number] Study Date: 04/18/2016 Gender:     F Age:        45 Height:     157.5 cm Weight:     80.4 kg BSA:        1.91 m^2 Pt. Status: Room:  6E05C  ADMITTING    Orpah Cobb, MD ATTENDING    Orpah Cobb, MD ORDERING     Orpah Cobb, MD PERFORMING   Orpah Cobb, MD REFERRING    Orpah Cobb, MD SONOGRAPHER  Leta Jungling, RDCS  cc:  ------------------------------------------------------------------- LV EF: 55% -   60%  ------------------------------------------------------------------- Indications:      Chest pain 786.51.  ------------------------------------------------------------------- History:   PMH:  GERD.  Murmur.  Congestive heart failure.  Aortic valve disease.  Primary pulmonary hypertension.  Risk factors: Hypertension. Diabetes mellitus. Dyslipidemia.  ------------------------------------------------------------------- Study Conclusions  - Left ventricle: The cavity size was normal. There was mild concentric hypertrophy. Systolic function was normal. The estimated ejection fraction was in the range of 55% to 60%. Wall motion was normal; there were no regional wall motion abnormalities. Doppler parameters are consistent with abnormal left ventricular relaxation (grade 1 diastolic dysfunction). - Aortic valve: Valve mobility was restricted. There was mild stenosis. There was mild regurgitation. Valve area (VTI): 1.39 cm^2. Valve area (Vmax): 1.33  cm^2. Valve area (Vmean): 1.26 cm^2. - Mitral valve: Moderately to severely calcified annulus. There was mild regurgitation. - Left atrium: The atrium was moderately dilated. - Pulmonary arteries: Systolic pressure was mildly increased. PA peak pressure: 32 mm Hg (S).  ------------------------------------------------------------------- Labs, prior tests, procedures, and surgery: Permanent pacemaker system implantation.  ------------------------------------------------------------------- Study data:  Comparison was made to the study of 07/11/2015.  Study status:  Routine.  Procedure:  Transthoracic echocardiography. Image quality was poor. The study was technically difficult, as a result of body habitus. Intravenous contrast (Definity) was administered.  Study completion:  There were no complications. Transthoracic echocardiography.  M-mode, complete 2D, spectral Doppler, and color Doppler.  Birthdate:  Patient birthdate: Jun 25, 1941.  Age:  Patient is 82 yr old.  Sex:  Gender: female. BMI: 32.4 kg/m^2.  Blood pressure:     101/53  Patient status: Inpatient.  Study date:  Study date: 04/18/2016. Study time: 04:23 PM.  Location:  Bedside.  -------------------------------------------------------------------  ------------------------------------------------------------------- Left ventricle:  The cavity size was normal. There was mild concentric hypertrophy. Systolic function was normal. The estimated ejection fraction was in the range of 55% to 60%. Wall motion was normal; there were no regional wall motion abnormalities. Doppler parameters are consistent with abnormal left ventricular relaxation (grade 1 diastolic dysfunction).  ------------------------------------------------------------------- Aortic valve:   Trileaflet; mildly thickened, mildly calcified leaflets. Valve mobility was restricted.  Doppler:   There was mild stenosis.   There was mild regurgitation.    VTI ratio of  LVOT to aortic valve: 0.55. Valve area (VTI): 1.39 cm^2. Indexed valve area (VTI): 0.73 cm^2/m^2. Peak velocity ratio of LVOT to aortic valve: 0.52. Valve area (Vmax): 1.33 cm^2. Indexed valve area (Vmax): 0.7 cm^2/m^2. Mean velocity ratio of LVOT to aortic valve: 0.49. Valve area (Vmean): 1.26 cm^2. Indexed valve area (Vmean): 0.66 cm^2/m^2. Mean gradient (S): 9 mm Hg. Peak gradient (S): 18 mm Hg.  ------------------------------------------------------------------- Aorta:  Aortic root: The aortic root was normal in size.  ------------------------------------------------------------------- Mitral valve:   Moderately to severely calcified annulus. Mobility was not restricted.  Doppler:  Transvalvular velocity was within the normal range. There was no evidence for stenosis. There was mild regurgitation.    Peak gradient (D): 5 mm Hg.  ------------------------------------------------------------------- Left atrium:  The atrium was moderately dilated.  ------------------------------------------------------------------- Right ventricle:  The cavity size was normal. Wall thickness was normal. The moderator band was prominent. Pacer wire or catheter noted in right ventricle. Systolic function was normal.  -------------------------------------------------------------------  Pulmonic valve:    The valve appears to be grossly normal. Doppler:  Transvalvular velocity was within the normal range. There was no evidence for stenosis.  ------------------------------------------------------------------- Tricuspid valve:   Structurally normal valve.    Doppler: Transvalvular velocity was within the normal range. There was mild regurgitation.  ------------------------------------------------------------------- Pulmonary artery:   The main pulmonary artery was normal-sized. Systolic pressure was mildly increased.  ------------------------------------------------------------------- Right atrium:   The atrium was normal in size. Pacer wire or catheter noted in right atrium.  ------------------------------------------------------------------- Pericardium:  There was no pericardial effusion.  ------------------------------------------------------------------- Systemic veins: Inferior vena cava: The vessel was dilated. The respirophasic diameter changes were blunted (< 50%), consistent with elevated central venous pressure.  ------------------------------------------------------------------- Measurements  Left ventricle                            Value          Reference LV ID, ED, PLAX chordal           (L)     39    mm       43 - 52 LV ID, ES, PLAX chordal                   27.8  mm       23 - 38 LV fx shortening, PLAX chordal            29    %        >=29 LV PW thickness, ED                       10.3  mm       --------- IVS/LV PW ratio, ED               (H)     1.35           <=1.3 Stroke volume, 2D                         64    ml       --------- Stroke volume/bsa, 2D                     34    ml/m^2   --------- LV e&', lateral                            6.42  cm/s     --------- LV E/e&', lateral                          17.45          --------- LV e&', medial                             5.66  cm/s     --------- LV E/e&', medial                           19.79          --------- LV e&', average                            6.04  cm/s     --------- LV  E/e&', average                          18.54          ---------  Ventricular septum                        Value          Reference IVS thickness, ED                         13.9  mm       ---------  LVOT                                      Value          Reference LVOT ID, S                                18    mm       --------- LVOT area                                 2.54  cm^2     --------- LVOT peak velocity, S                     111   cm/s     --------- LVOT mean velocity, S                     67.7  cm/s      --------- LVOT VTI, S                               25    cm       ---------  Aortic valve                              Value          Reference Aortic valve peak velocity, S             212   cm/s     --------- Aortic valve mean velocity, S             137   cm/s     --------- Aortic valve VTI, S                       45.6  cm       --------- Aortic mean gradient, S                   9     mm Hg    --------- Aortic peak gradient, S                   18    mm Hg    --------- VTI ratio, LVOT/AV                        0.55           --------- Aortic valve area, VTI  1.39  cm^2     --------- Aortic valve area/bsa, VTI                0.73  cm^2/m^2 --------- Velocity ratio, peak, LVOT/AV             0.52           --------- Aortic valve area, peak velocity          1.33  cm^2     --------- Aortic valve area/bsa, peak               0.7   cm^2/m^2 --------- velocity Velocity ratio, mean, LVOT/AV             0.49           --------- Aortic valve area, mean velocity          1.26  cm^2     --------- Aortic valve area/bsa, mean               0.66  cm^2/m^2 --------- velocity Aortic regurg pressure half-time          463   ms       ---------  Aorta                                     Value          Reference Aortic root ID, ED                        23    mm       ---------  Left atrium                               Value          Reference LA ID, A-P, ES                            42    mm       --------- LA ID/bsa, A-P                            2.2   cm/m^2   <=2.2 LA volume, S                              58.8  ml       --------- LA volume/bsa, S                          30.8  ml/m^2   --------- LA volume, ES, 1-p A4C                    54.4  ml       --------- LA volume/bsa, ES, 1-p A4C                28.5  ml/m^2   --------- LA volume, ES, 1-p A2C                    56.4  ml       --------- LA volume/bsa, ES, 1-p A2C  29.6  ml/m^2   ---------  Mitral  valve                              Value          Reference Mitral E-wave peak velocity               112   cm/s     --------- Mitral A-wave peak velocity               108   cm/s     --------- Mitral deceleration time          (H)     268   ms       150 - 230 Mitral peak gradient, D                   5     mm Hg    --------- Mitral E/A ratio, peak                    1              ---------  Pulmonary arteries                        Value          Reference PA pressure, S, DP                (H)     32    mm Hg    <=30  Tricuspid valve                           Value          Reference Tricuspid regurg peak velocity            268   cm/s     --------- Tricuspid peak RV-RA gradient             29    mm Hg    ---------  Systemic veins                            Value          Reference Estimated CVP                             3     mm Hg    ---------  Right ventricle                           Value          Reference TAPSE                                     27.2  mm       --------- RV pressure, S, DP                (H)     32    mm Hg    <=30 RV s&', lateral, S                         15  cm/s     ---------  Legend: (L)  and  (H)  Gissell Barra values outside specified reference range.  ------------------------------------------------------------------- Prepared and Electronically Authenticated by  Orpah Cobb, MD 2017-09-30T15:23:56              EKG:  no new  Recent Labs: No results found for requested labs within last 365 days.  Recent Lipid Panel    Component Value Date/Time   CHOL 149 08/16/2019 1411   TRIG 93.0 08/16/2019 1411   HDL 48.50 08/16/2019 1411   CHOLHDL 3 08/16/2019 1411   VLDL 18.6 08/16/2019 1411   LDLCALC 82 08/16/2019 1411               Physical Exam:    VS:  BP 130/70   Pulse 76   Ht 5' (1.524 m)   Wt 172 lb (78 kg)   LMP  (LMP Unknown)   SpO2 97%   BMI 33.59 kg/m     Wt Readings from Last 3 Encounters:  12/07/22 172 lb (78 kg)   09/21/22 169 lb 9.6 oz (76.9 kg)  09/24/21 187 lb (84.8 kg)     GEN:  Well nourished, well developed in no acute distress HEENT: Normal NECK: No JVD; No carotid bruits LYMPHATICS: No lymphadenopathy CARDIAC: RRR, 2/6 systolic murmur, no rubs, gallops RESPIRATORY:  Clear to auscultation without rales, wheezing or rhonchi  ABDOMEN: Soft, non-tender, non-distended MUSCULOSKELETAL:  No edema; No deformity  SKIN: Warm and dry NEUROLOGIC:  Alert and oriented x 3 PSYCHIATRIC:  Normal affect   ASSESSMENT:    1. Chronic diastolic congestive heart failure (HCC)   2. Nonrheumatic aortic valve stenosis   3. Sick sinus syndrome [facility])    PLAN:    In order of problems listed above:  Permanent pacemaker Medtronic 2015 SND - Dr. Graciela Husbands, Otilio Saber following.  Doing well.  Subclinical atrial fibrillation - Monitored by device.  No recent occurrences.  Chronic diastolic heart failure - Appears euvolemic.  Rarely takes Lasix but she continues with losartan hydrochlorothiazide.  Aortic stenosis - In 2022 echocardiogram was mild.  We will continue to monitor.  Checking echocardiogram  Chronic kidney disease - Creatinine 2.3 recently.  Continue to monitor.  Primary care physician monitoring closely.            Medication Adjustments/Labs and Tests Ordered: Current medicines are reviewed at length with the patient today.  Concerns regarding medicines are outlined above.  Orders Placed This Encounter  Procedures   ECHOCARDIOGRAM COMPLETE   No orders of the defined types were placed in this encounter.   Patient Instructions  Medication Instructions:  The current medical regimen is effective;  continue present plan and medications.  *If you need a refill on your cardiac medications before your next appointment, please call your pharmacy*  Testing/Procedures: Your physician has requested that you have an echocardiogram. Echocardiography is a painless test that uses sound waves  to create images of your heart. It provides your doctor with information about the size and shape of your heart and how well your heart's chambers and valves are working. This procedure takes approximately one hour. There are no restrictions for this procedure. Please do NOT wear cologne, perfume, aftershave, or lotions (deodorant is allowed). Please arrive 15 minutes prior to your appointment time.  Follow-Up: At [facility], you and your health needs are our priority.  As part of our continuing mission to provide you with exceptional heart care, we have created designated Provider Care Teams.  These Care Teams include your primary Cardiologist (physician) and Advanced Practice Providers (APPs -  Physician Assistants and Nurse Practitioners) who all work together to provide you with the care you need, when you need it.  We recommend signing up for the patient portal called "MyChart".  Sign up information is provided on this After Visit Summary.  MyChart is used to connect with patients for Virtual Visits (Telemedicine).  Patients are able to view lab/test results, encounter notes, upcoming appointments, etc.  Non-urgent messages can be sent to your provider as well.   To learn more about what you can do with MyChart, go to [email or web address].    Your next appointment:   1 year(s)  Provider:   Donato Schultz, MD        Signed, Donato Schultz, MD  12/07/2022 8:53 AM    [facility]

## 2022-12-07 NOTE — Patient Instructions (Signed)
Medication Instructions:  The current medical regimen is effective;  continue present plan and medications.  *If you need a refill on your cardiac medications before your next appointment, please call your pharmacy*  Testing/Procedures: Your physician has requested that you have an echocardiogram. Echocardiography is a painless test that uses sound waves to create images of your heart. It provides your doctor with information about the size and shape of your heart and how well your heart's chambers and valves are working. This procedure takes approximately one hour. There are no restrictions for this procedure. Please do NOT wear cologne, perfume, aftershave, or lotions (deodorant is allowed). Please arrive 15 minutes prior to your appointment time.  Follow-Up: At [facility], you and your health needs are our priority.  As part of our continuing mission to provide you with exceptional heart care, we have created designated Provider Care Teams.  These Care Teams include your primary Cardiologist (physician) and Advanced Practice Providers (APPs -  Physician Assistants and Nurse Practitioners) who all work together to provide you with the care you need, when you need it.  We recommend signing up for the patient portal called "MyChart".  Sign up information is provided on this After Visit Summary.  MyChart is used to connect with patients for Virtual Visits (Telemedicine).  Patients are able to view lab/test results, encounter notes, upcoming appointments, etc.  Non-urgent messages can be sent to your provider as well.   To learn more about what you can do with MyChart, go to [email or web address].    Your next appointment:   1 year(s)  Provider:   Mark Skains, MD      

## 2023-01-05 ENCOUNTER — Ambulatory Visit (HOSPITAL_COMMUNITY): Payer: Medicare HMO | Attending: Cardiology

## 2023-01-05 DIAGNOSIS — I5032 Chronic diastolic (congestive) heart failure: Secondary | ICD-10-CM | POA: Diagnosis not present

## 2023-01-05 DIAGNOSIS — I350 Nonrheumatic aortic (valve) stenosis: Secondary | ICD-10-CM | POA: Diagnosis not present

## 2023-01-05 LAB — ECHOCARDIOGRAM COMPLETE
AR max vel: 1.25 cm2
AV Area VTI: 1.35 cm2
AV Area mean vel: 1.19 cm2
AV Mean grad: 11.6 mmHg
AV Peak grad: 20.3 mmHg
Ao pk vel: 2.25 m/s
Area-P 1/2: 3.23 cm2
MV M vel: 5.37 m/s
MV Peak grad: 115.3 mmHg
P 1/2 time: 350 msec
S' Lateral: 2.4 cm

## 2023-01-07 ENCOUNTER — Ambulatory Visit: Payer: Medicare HMO

## 2023-01-07 DIAGNOSIS — I495 Sick sinus syndrome: Secondary | ICD-10-CM | POA: Diagnosis not present

## 2023-01-12 LAB — CUP PACEART REMOTE DEVICE CHECK
Battery Remaining Longevity: 14 mo
Battery Voltage: 2.89 V
Brady Statistic AP VP Percent: 0.08 %
Brady Statistic AP VS Percent: 99.02 %
Brady Statistic AS VP Percent: 0 %
Brady Statistic AS VS Percent: 0.9 %
Brady Statistic RA Percent Paced: 99.07 %
Brady Statistic RV Percent Paced: 0.09 %
Date Time Interrogation Session: 20240621192316
Implantable Lead Connection Status: 753985
Implantable Lead Connection Status: 753985
Implantable Lead Implant Date: 20150729
Implantable Lead Implant Date: 20150729
Implantable Lead Location: 753859
Implantable Lead Location: 753860
Implantable Lead Model: 5076
Implantable Lead Model: 5076
Implantable Pulse Generator Implant Date: 20150729
Lead Channel Impedance Value: 342 Ohm
Lead Channel Impedance Value: 437 Ohm
Lead Channel Impedance Value: 456 Ohm
Lead Channel Impedance Value: 494 Ohm
Lead Channel Pacing Threshold Amplitude: 0.75 V
Lead Channel Pacing Threshold Amplitude: 1 V
Lead Channel Pacing Threshold Pulse Width: 0.4 ms
Lead Channel Pacing Threshold Pulse Width: 0.4 ms
Lead Channel Sensing Intrinsic Amplitude: 1.625 mV
Lead Channel Sensing Intrinsic Amplitude: 1.625 mV
Lead Channel Sensing Intrinsic Amplitude: 9.25 mV
Lead Channel Sensing Intrinsic Amplitude: 9.25 mV
Lead Channel Setting Pacing Amplitude: 2 V
Lead Channel Setting Pacing Amplitude: 2 V
Lead Channel Setting Pacing Pulse Width: 0.4 ms
Lead Channel Setting Sensing Sensitivity: 2 mV
Zone Setting Status: 755011
Zone Setting Status: 755011

## 2023-01-27 NOTE — Progress Notes (Signed)
Remote pacemaker transmission.   

## 2023-03-16 ENCOUNTER — Telehealth: Payer: Self-pay | Admitting: Cardiology

## 2023-03-16 NOTE — Telephone Encounter (Signed)
Received forms for pt for  Medical Certification for Disability Exceptions [employer] [place] Citizenship and Immigration Services  "This form is used for applicants to seek an exception to the [place] and civics requirements due to physical or developmental disability or mental impairment that has lasted or is expected to last 12 months or more." Will have Dr Anne Fu to review.

## 2023-03-16 NOTE — Telephone Encounter (Signed)
Let Pts daughter know that we wouldn't be able to fill out forms and that her money will be waiting at her convenience.

## 2023-03-16 NOTE — Telephone Encounter (Signed)
Forms and payment were received on _8/27/2024____ from patient. Placed in "MD's box."

## 2023-03-16 NOTE — Telephone Encounter (Signed)
Dr Anne Fu has reviewed paperwork/forms to be completed.  He advised this paperwork should be completed by pt's PCP as this would not be something he can complete.  Returned to front desk to contact pt for pickup.

## 2023-03-17 ENCOUNTER — Telehealth (HOSPITAL_BASED_OUTPATIENT_CLINIC_OR_DEPARTMENT_OTHER): Payer: Self-pay | Admitting: Licensed Clinical Social Worker

## 2023-03-17 NOTE — Telephone Encounter (Signed)
H&V Care Navigation CSW Progress Note  Clinical Social Worker  received return call  from pt daughter Maxine Glenn. Introduced self, role, reason for call. Since no DPR on file I did not share medical information but I did explain that Dr. Anne Fu did not feel that he could complete the forms based on what they were asking for medically. LCSW inquired if PCP had refused to complete them or just asked for her to rbing to cardiology. It seems to be the latter. I encouraged her to f/u with PCP again first to see if they can complete paperwork with the understanding that the cardiology provider did not feel we could complete request. If PCP has more questions about why we did not complete it he can call to speak with provider. LCSW also offered to provide community resources regarding  immigration assistance. Will print and send information regarding immigration assistance support from [facility] [employer] and [employer]. I will include my card. Reminded her paperwork and her payment are ready for pick up whenever works for her. No additional questions at this time.  Patient is participating in a Managed Medicaid Plan:  No, [employer] Medicare and Medicaid  SDOH Screenings   Food Insecurity: Medium Risk (01/09/2023)   Received from [facility], [facility]  Transportation Needs: No Transportation Needs (01/09/2023)   Received from [facility], [facility]  Utilities: High Risk (01/09/2023)   [facility]  Depression (PHQ2-9): Medium Risk (02/01/2019)  Financial Resource Strain: Low Risk  (10/06/2022)   Received from [facility], [facility]  Social Connections: Unknown (12/02/2021)   Received from [facility], [facility]  Stress: No Stress Concern Present (10/06/2022)   Received from [facility], [facility]  Tobacco Use: Low Risk  (02/15/2023)   Received from [facility]    Octavio Graves, MSW, LCSW Clinical Social Worker II [facility] Heart/Vascular Care Navigation  [phone]- work cell phone (preferred) [phone]- desk phone

## 2023-03-17 NOTE — Telephone Encounter (Signed)
H&V Care Navigation CSW Progress Note  Clinical Social Worker contacted caregiver by phone (pt daughter Maxine Glenn, no DPR on file but is the number listed for pt) to f/u on paperwork for naturalization exemptions. Per Lorenza Cambridge, patient access supervisor Dr. Anne Fu is not able to complete the paperwork based on the criteria it is asking about. Per notes best completed by PCP office. The information I received is that PCP office is not able to complete at this time. Called pt daughter at [phone] to provide community resources that may be able to assist her with what to do with next steps since this relates to immigration. No answer, voicemail left. Will re-attempt as able.   Patient is participating in a Managed Medicaid Plan:  No, [employer] Medicare only  SDOH Screenings   Food Insecurity: Medium Risk (01/09/2023)   Received from [facility], [facility]  Transportation Needs: No Transportation Needs (01/09/2023)   Received from [facility], [facility]  Utilities: High Risk (01/09/2023)   [facility]  Depression (PHQ2-9): Medium Risk (02/01/2019)  Financial Resource Strain: Low Risk  (10/06/2022)   Received from [facility], [facility]  Social Connections: Unknown (12/02/2021)   Received from [facility], [facility]  Stress: No Stress Concern Present (10/06/2022)   Received from [facility], [facility]  Tobacco Use: Low Risk  (02/15/2023)   Received from [facility]    Octavio Graves, MSW, LCSW Clinical Social Worker II [facility] Heart/Vascular Care Navigation  [phone]- work cell phone (preferred) [phone]- desk phone

## 2023-05-29 ENCOUNTER — Other Ambulatory Visit: Payer: Self-pay | Admitting: Student

## 2023-05-29 DIAGNOSIS — I272 Pulmonary hypertension, unspecified: Secondary | ICD-10-CM

## 2023-07-08 ENCOUNTER — Ambulatory Visit (INDEPENDENT_AMBULATORY_CARE_PROVIDER_SITE_OTHER): Payer: Medicare HMO

## 2023-07-08 DIAGNOSIS — I495 Sick sinus syndrome: Secondary | ICD-10-CM | POA: Diagnosis not present

## 2023-07-13 ENCOUNTER — Telehealth: Payer: Self-pay

## 2023-07-13 LAB — CUP PACEART REMOTE DEVICE CHECK
Battery Remaining Longevity: 6 mo
Battery Voltage: 2.87 V
Brady Statistic AP VP Percent: 1.45 %
Brady Statistic AP VS Percent: 66.65 %
Brady Statistic AS VP Percent: 5.59 %
Brady Statistic AS VS Percent: 26.31 %
Brady Statistic RA Percent Paced: 62.72 %
Brady Statistic RV Percent Paced: 6.81 %
Date Time Interrogation Session: 20241223174654
Implantable Lead Connection Status: 753985
Implantable Lead Connection Status: 753985
Implantable Lead Implant Date: 20150729
Implantable Lead Implant Date: 20150729
Implantable Lead Location: 753859
Implantable Lead Location: 753860
Implantable Lead Model: 5076
Implantable Lead Model: 5076
Implantable Pulse Generator Implant Date: 20150729
Lead Channel Impedance Value: 304 Ohm
Lead Channel Impedance Value: 361 Ohm
Lead Channel Impedance Value: 437 Ohm
Lead Channel Impedance Value: 456 Ohm
Lead Channel Pacing Threshold Amplitude: 0.75 V
Lead Channel Pacing Threshold Amplitude: 1 V
Lead Channel Pacing Threshold Pulse Width: 0.4 ms
Lead Channel Pacing Threshold Pulse Width: 0.4 ms
Lead Channel Sensing Intrinsic Amplitude: 1.75 mV
Lead Channel Sensing Intrinsic Amplitude: 1.75 mV
Lead Channel Sensing Intrinsic Amplitude: 9.375 mV
Lead Channel Sensing Intrinsic Amplitude: 9.375 mV
Lead Channel Setting Pacing Amplitude: 2 V
Lead Channel Setting Pacing Amplitude: 2 V
Lead Channel Setting Pacing Pulse Width: 0.4 ms
Lead Channel Setting Sensing Sensitivity: 2 mV
Zone Setting Status: 755011
Zone Setting Status: 755011

## 2023-08-03 NOTE — Telephone Encounter (Signed)
 Error

## 2023-08-10 NOTE — Progress Notes (Signed)
Remote pacemaker transmission.   

## 2023-08-10 NOTE — Addendum Note (Signed)
Addended by: Elease Etienne A on: 08/10/2023 02:36 PM   Modules accepted: Orders

## 2023-08-25 ENCOUNTER — Telehealth: Payer: Self-pay

## 2023-08-25 NOTE — Telephone Encounter (Signed)
The interpretor left a voicemail for the pt to send a manual transmission with her home monitor.

## 2023-08-25 NOTE — Telephone Encounter (Signed)
 Result Note Remote reviewed. This remote is abnormal for afib A-- can we hve her transmit and then can you help me make sure I see it as we have to do something I suspect about her afib

## 2023-08-25 NOTE — Telephone Encounter (Signed)
-----   Message from Nurse Franki Cabot sent at 08/24/2023  4:15 PM EST ----- LM (via interpreter) requesting patient call back to obtain updated transmission to assess AF for Dr. Graciela Husbands.

## 2023-09-06 NOTE — Telephone Encounter (Signed)
Message sent to scheduler.  Pt due for 1 year follow up.

## 2023-09-23 NOTE — Telephone Encounter (Signed)
 Via Interpreter, Grayce Sessions - ID # [phone] [id number] - [email or web address] on daughters monica/estela contact to schedule pt for 1 yr follow up/PPM MDT check/afib tx

## 2023-10-14 ENCOUNTER — Encounter: Payer: Self-pay | Admitting: Emergency Medicine
